# Patient Record
Sex: Female | Born: 1960
Health system: Southern US, Community
[De-identification: ages and names within clinical notes are randomized; demographics above are authoritative.]

## PROBLEM LIST (undated history)

## (undated) DIAGNOSIS — R0602 Shortness of breath: Secondary | ICD-10-CM

## (undated) DIAGNOSIS — R51 Headache: Secondary | ICD-10-CM

## (undated) DIAGNOSIS — R079 Chest pain, unspecified: Secondary | ICD-10-CM

## (undated) DIAGNOSIS — F329 Major depressive disorder, single episode, unspecified: Secondary | ICD-10-CM

## (undated) DIAGNOSIS — I509 Heart failure, unspecified: Secondary | ICD-10-CM

## (undated) DIAGNOSIS — M1990 Unspecified osteoarthritis, unspecified site: Secondary | ICD-10-CM

## (undated) DIAGNOSIS — F419 Anxiety disorder, unspecified: Secondary | ICD-10-CM

## (undated) DIAGNOSIS — E039 Hypothyroidism, unspecified: Secondary | ICD-10-CM

## (undated) DIAGNOSIS — F32A Depression, unspecified: Secondary | ICD-10-CM

## (undated) DIAGNOSIS — I10 Essential (primary) hypertension: Secondary | ICD-10-CM

## (undated) DIAGNOSIS — G709 Myoneural disorder, unspecified: Secondary | ICD-10-CM

## (undated) DIAGNOSIS — E119 Type 2 diabetes mellitus without complications: Secondary | ICD-10-CM

## (undated) HISTORY — PX: ABDOMINAL HYSTERECTOMY: SHX81

---

## 2001-09-18 ENCOUNTER — Ambulatory Visit (HOSPITAL_COMMUNITY): Admission: RE | Admit: 2001-09-18 | Discharge: 2001-09-18 | Payer: Self-pay | Admitting: Internal Medicine

## 2001-09-18 ENCOUNTER — Encounter: Payer: Self-pay | Admitting: Internal Medicine

## 2004-07-08 ENCOUNTER — Ambulatory Visit (HOSPITAL_COMMUNITY): Admission: RE | Admit: 2004-07-08 | Discharge: 2004-07-08 | Payer: Self-pay | Admitting: Family Medicine

## 2010-01-22 ENCOUNTER — Ambulatory Visit: Payer: Self-pay | Admitting: Cardiology

## 2010-05-02 ENCOUNTER — Emergency Department (HOSPITAL_COMMUNITY): Admission: EM | Admit: 2010-05-02 | Discharge: 2010-05-03 | Payer: Self-pay | Admitting: Emergency Medicine

## 2012-11-04 DIAGNOSIS — R079 Chest pain, unspecified: Secondary | ICD-10-CM

## 2012-11-04 HISTORY — DX: Chest pain, unspecified: R07.9

## 2012-11-18 DIAGNOSIS — R609 Edema, unspecified: Secondary | ICD-10-CM

## 2012-11-19 DIAGNOSIS — I509 Heart failure, unspecified: Secondary | ICD-10-CM

## 2012-11-19 DIAGNOSIS — I5021 Acute systolic (congestive) heart failure: Secondary | ICD-10-CM

## 2012-11-20 ENCOUNTER — Inpatient Hospital Stay (HOSPITAL_COMMUNITY)
Admission: RE | Admit: 2012-11-20 | Discharge: 2012-11-21 | DRG: 287 | Disposition: A | Discharge status: Home / Self Care | Payer: Medicare Other | Source: Ambulatory Visit | Attending: Cardiology | Admitting: Cardiology

## 2012-11-20 ENCOUNTER — Encounter (HOSPITAL_COMMUNITY)
Admission: RE | Disposition: A | Discharge status: Home / Self Care | Payer: Self-pay | Source: Ambulatory Visit | Attending: Cardiology

## 2012-11-20 ENCOUNTER — Other Ambulatory Visit: Payer: Self-pay | Admitting: Physician Assistant

## 2012-11-20 ENCOUNTER — Encounter (HOSPITAL_COMMUNITY): Payer: Self-pay | Admitting: General Practice

## 2012-11-20 ENCOUNTER — Inpatient Hospital Stay (HOSPITAL_COMMUNITY): Admit: 2012-11-20 | Payer: Self-pay | Source: Other Acute Inpatient Hospital | Admitting: Cardiology

## 2012-11-20 DIAGNOSIS — F339 Major depressive disorder, recurrent, unspecified: Secondary | ICD-10-CM

## 2012-11-20 DIAGNOSIS — I2510 Atherosclerotic heart disease of native coronary artery without angina pectoris: Secondary | ICD-10-CM | POA: Diagnosis present

## 2012-11-20 DIAGNOSIS — I509 Heart failure, unspecified: Secondary | ICD-10-CM

## 2012-11-20 DIAGNOSIS — F3289 Other specified depressive episodes: Secondary | ICD-10-CM | POA: Diagnosis present

## 2012-11-20 DIAGNOSIS — I428 Other cardiomyopathies: Secondary | ICD-10-CM | POA: Diagnosis present

## 2012-11-20 DIAGNOSIS — S0990XA Unspecified injury of head, initial encounter: Secondary | ICD-10-CM

## 2012-11-20 DIAGNOSIS — I10 Essential (primary) hypertension: Secondary | ICD-10-CM | POA: Diagnosis present

## 2012-11-20 DIAGNOSIS — Z23 Encounter for immunization: Secondary | ICD-10-CM

## 2012-11-20 DIAGNOSIS — E119 Type 2 diabetes mellitus without complications: Secondary | ICD-10-CM

## 2012-11-20 DIAGNOSIS — I2789 Other specified pulmonary heart diseases: Secondary | ICD-10-CM | POA: Diagnosis present

## 2012-11-20 DIAGNOSIS — E039 Hypothyroidism, unspecified: Secondary | ICD-10-CM | POA: Diagnosis present

## 2012-11-20 DIAGNOSIS — F329 Major depressive disorder, single episode, unspecified: Secondary | ICD-10-CM | POA: Diagnosis present

## 2012-11-20 DIAGNOSIS — I5021 Acute systolic (congestive) heart failure: Principal | ICD-10-CM

## 2012-11-20 DIAGNOSIS — I4891 Unspecified atrial fibrillation: Secondary | ICD-10-CM | POA: Diagnosis present

## 2012-11-20 HISTORY — DX: Chest pain, unspecified: R07.9

## 2012-11-20 HISTORY — DX: Essential (primary) hypertension: I10

## 2012-11-20 HISTORY — PX: LEFT AND RIGHT HEART CATHETERIZATION WITH CORONARY ANGIOGRAM: SHX5449

## 2012-11-20 HISTORY — DX: Depression, unspecified: F32.A

## 2012-11-20 HISTORY — DX: Headache: R51

## 2012-11-20 HISTORY — DX: Major depressive disorder, single episode, unspecified: F32.9

## 2012-11-20 HISTORY — DX: Type 2 diabetes mellitus without complications: E11.9

## 2012-11-20 HISTORY — DX: Hypothyroidism, unspecified: E03.9

## 2012-11-20 HISTORY — DX: Myoneural disorder, unspecified: G70.9

## 2012-11-20 HISTORY — DX: Unspecified osteoarthritis, unspecified site: M19.90

## 2012-11-20 HISTORY — DX: Heart failure, unspecified: I50.9

## 2012-11-20 HISTORY — DX: Anxiety disorder, unspecified: F41.9

## 2012-11-20 HISTORY — DX: Shortness of breath: R06.02

## 2012-11-20 LAB — CBC
MCH: 26.5 pg (ref 26.0–34.0)
MCHC: 33.7 g/dL (ref 30.0–36.0)
Platelets: 402 10*3/uL — ABNORMAL HIGH (ref 150–400)
RDW: 13.9 % (ref 11.5–15.5)

## 2012-11-20 LAB — POCT I-STAT 3, VENOUS BLOOD GAS (G3P V)
Bicarbonate: 25.8 mEq/L — ABNORMAL HIGH (ref 20.0–24.0)
O2 Saturation: 59 %
TCO2: 27 mmol/L (ref 0–100)
pCO2, Ven: 45.5 mmHg (ref 45.0–50.0)
pO2, Ven: 32 mmHg (ref 30.0–45.0)

## 2012-11-20 LAB — GLUCOSE, CAPILLARY
Glucose-Capillary: 202 mg/dL — ABNORMAL HIGH (ref 70–99)
Glucose-Capillary: 220 mg/dL — ABNORMAL HIGH (ref 70–99)

## 2012-11-20 LAB — POCT I-STAT 3, ART BLOOD GAS (G3+)
Acid-Base Excess: 1 mmol/L (ref 0.0–2.0)
Bicarbonate: 25.2 mEq/L — ABNORMAL HIGH (ref 20.0–24.0)
O2 Saturation: 93 %
pO2, Arterial: 67 mmHg — ABNORMAL LOW (ref 80.0–100.0)

## 2012-11-20 LAB — CREATININE, SERUM: Creatinine, Ser: 0.5 mg/dL (ref 0.50–1.10)

## 2012-11-20 SURGERY — LEFT AND RIGHT HEART CATHETERIZATION WITH CORONARY ANGIOGRAM
Anesthesia: LOCAL

## 2012-11-20 MED ORDER — FUROSEMIDE 10 MG/ML IJ SOLN
40.0000 mg | Freq: Two times a day (BID) | INTRAMUSCULAR | Status: DC
  Administered 2012-11-21: 40 mg via INTRAVENOUS
  Filled 2012-11-20 (×2): qty 4

## 2012-11-20 MED ORDER — FUROSEMIDE 40 MG PO TABS
40.0000 mg | ORAL_TABLET | Freq: Every day | ORAL | Status: DC
  Administered 2012-11-20: 40 mg via ORAL
  Filled 2012-11-20 (×2): qty 1

## 2012-11-20 MED ORDER — LEVOTHYROXINE SODIUM 75 MCG PO TABS
75.0000 ug | ORAL_TABLET | Freq: Every day | ORAL | Status: DC
  Administered 2012-11-21: 75 ug via ORAL
  Filled 2012-11-20 (×2): qty 1

## 2012-11-20 MED ORDER — METOPROLOL TARTRATE 1 MG/ML IV SOLN
INTRAVENOUS | Status: AC
  Filled 2012-11-20: qty 10

## 2012-11-20 MED ORDER — INFLUENZA VIRUS VACC SPLIT PF IM SUSP
0.5000 mL | INTRAMUSCULAR | Status: DC
  Filled 2012-11-20: qty 0.5

## 2012-11-20 MED ORDER — DULOXETINE HCL 20 MG PO CPEP
20.0000 mg | ORAL_CAPSULE | Freq: Every day | ORAL | Status: DC
  Administered 2012-11-20 – 2012-11-21 (×2): 20 mg via ORAL
  Filled 2012-11-20 (×3): qty 1

## 2012-11-20 MED ORDER — LIDOCAINE HCL (PF) 1 % IJ SOLN
INTRAMUSCULAR | Status: AC
  Filled 2012-11-20: qty 30

## 2012-11-20 MED ORDER — NITROGLYCERIN 0.2 MG/ML ON CALL CATH LAB
INTRAVENOUS | Status: AC
  Filled 2012-11-20: qty 1

## 2012-11-20 MED ORDER — ASPIRIN 81 MG PO CHEW
324.0000 mg | CHEWABLE_TABLET | ORAL | Status: DC
  Administered 2012-11-20: 324 mg via ORAL

## 2012-11-20 MED ORDER — ASPIRIN 81 MG PO CHEW: 324.0000 mg | CHEWABLE_TABLET | Freq: Once | ORAL | Status: DC

## 2012-11-20 MED ORDER — ASPIRIN 81 MG PO CHEW
81.0000 mg | CHEWABLE_TABLET | Freq: Every day | ORAL | Status: DC
  Administered 2012-11-21: 81 mg via ORAL
  Filled 2012-11-20: qty 1

## 2012-11-20 MED ORDER — SODIUM CHLORIDE 0.9 % IV SOLN: INTRAVENOUS | Status: AC

## 2012-11-20 MED ORDER — FENTANYL CITRATE 0.05 MG/ML IJ SOLN
INTRAMUSCULAR | Status: AC
  Filled 2012-11-20: qty 2

## 2012-11-20 MED ORDER — ACETAMINOPHEN 325 MG PO TABS
650.0000 mg | ORAL_TABLET | ORAL | Status: DC | PRN
  Administered 2012-11-20 – 2012-11-21 (×3): 650 mg via ORAL
  Filled 2012-11-20 (×3): qty 2

## 2012-11-20 MED ORDER — HEPARIN (PORCINE) IN NACL 2-0.9 UNIT/ML-% IJ SOLN
INTRAMUSCULAR | Status: AC
  Filled 2012-11-20: qty 1000

## 2012-11-20 MED ORDER — CITALOPRAM HYDROBROMIDE 20 MG PO TABS
20.0000 mg | ORAL_TABLET | Freq: Every day | ORAL | Status: DC
  Administered 2012-11-20 – 2012-11-21 (×2): 20 mg via ORAL
  Filled 2012-11-20 (×2): qty 1

## 2012-11-20 MED ORDER — CARVEDILOL 12.5 MG PO TABS
12.5000 mg | ORAL_TABLET | Freq: Two times a day (BID) | ORAL | Status: DC
  Administered 2012-11-20 – 2012-11-21 (×3): 12.5 mg via ORAL
  Filled 2012-11-20 (×5): qty 1

## 2012-11-20 MED ORDER — MIDAZOLAM HCL 2 MG/2ML IJ SOLN
INTRAMUSCULAR | Status: AC
  Filled 2012-11-20: qty 2

## 2012-11-20 MED ORDER — LISINOPRIL 40 MG PO TABS
40.0000 mg | ORAL_TABLET | Freq: Every day | ORAL | Status: DC
  Administered 2012-11-21: 40 mg via ORAL
  Filled 2012-11-20: qty 1

## 2012-11-20 MED ORDER — INSULIN ASPART 100 UNIT/ML ~~LOC~~ SOLN
0.0000 [IU] | Freq: Three times a day (TID) | SUBCUTANEOUS | Status: DC
  Administered 2012-11-20 (×2): 2 [IU] via SUBCUTANEOUS
  Administered 2012-11-21: 5 [IU] via SUBCUTANEOUS
  Administered 2012-11-21: 2 [IU] via SUBCUTANEOUS

## 2012-11-20 MED ORDER — HEPARIN SODIUM (PORCINE) 5000 UNIT/ML IJ SOLN
5000.0000 [IU] | Freq: Three times a day (TID) | INTRAMUSCULAR | Status: DC
  Administered 2012-11-20 – 2012-11-21 (×2): 5000 [IU] via SUBCUTANEOUS
  Filled 2012-11-20 (×6): qty 1

## 2012-11-20 MED ORDER — CITALOPRAM HYDROBROMIDE 10 MG/5ML PO SOLN: 20.0000 mg | Freq: Every day | ORAL | Status: DC

## 2012-11-20 MED ORDER — ASPIRIN 81 MG PO CHEW
CHEWABLE_TABLET | ORAL | Status: AC
  Filled 2012-11-20: qty 4

## 2012-11-20 MED ORDER — ONDANSETRON HCL 4 MG/2ML IJ SOLN: 4.0000 mg | Freq: Four times a day (QID) | INTRAMUSCULAR | Status: DC | PRN

## 2012-11-20 NOTE — H&P (Signed)
  Patient transferred from [facility] in [place]. Complete records sent from the outside hospital and will be scanned into [facility]. 51 yo WF with history of DM, HTN, and closed head injury presents with new onset of CHF. Echo demonstrates EF 20-25%. For right and left heart cath today.  Peter Swaziland MD, [facility]

## 2012-11-20 NOTE — Interval H&P Note (Signed)
History and Physical Interval Note:  11/20/2012 8:35 AM  Ashley Watts  has presented today for surgery, with the diagnosis of cad  The various methods of treatment have been discussed with the patient and family. After consideration of risks, benefits and other options for treatment, the patient has consented to  Procedure(s) (LRB) with comments: LEFT AND RIGHT HEART CATHETERIZATION WITH CORONARY ANGIOGRAM (N/A) as a surgical intervention .  The patient's history has been reviewed, patient examined, no change in status, stable for surgery.  I have reviewed the patient's chart and labs.  Questions were answered to the patient's satisfaction.     Theron Arista [facility] 11/20/2012 8:35 AM

## 2012-11-20 NOTE — CV Procedure (Signed)
   Cardiac Catheterization Procedure Note  Name: Ashley Watts MRN: [id number] DOB: [birth year]  Procedure: Right Heart Cath, Left Heart Cath, Selective Coronary Angiography, LV angiography  Indication: 51 year old white female with history of diabetes and hypertension who presents with new onset of congestive heart failure. Ejection fraction by echocardiogram was 20-25%.   Procedural Details: The right groin was prepped, draped, and anesthetized with 1% lidocaine. Using the modified Seldinger technique a 5 French sheath was placed in the right femoral artery and a 7 French sheath was placed in the right femoral vein. A Swan-Ganz catheter was used for the right heart catheterization. Standard protocol was followed for recording of right heart pressures and sampling of oxygen saturations. Fick cardiac output was calculated. Standard Judkins catheters were used for selective coronary angiography and left ventriculography. There were no immediate procedural complications. The patient was transferred to the post catheterization recovery area for further monitoring.  Procedural Findings: Hemodynamics RA 7/5 with a mean of 4 mmHg RV 49/8 mmHg PA 54/30 with a mean of 40 mmHg PCWP 22/19 with a mean of 15 mmHg LV 169/20 mmHg AO 173/107 with a mean of 139 mmHg  There is no significant aortic or mitral valve gradient.  Oxygen saturations: PA 59% AO 93%  Cardiac Output (Fick) 4.74 L per minute  Cardiac Index (Fick) 2.42 L per minute per meter square   Coronary angiography: Coronary dominance: right  Left mainstem: Normal.  Left anterior descending (LAD): Normal.  Left circumflex (LCx): Normal.  Right coronary artery (RCA): Normal.  Left ventriculography: Left ventricular size is mildly increased. There is severe global hypokinesis with ejection fraction of 20-25%. There is minimal mitral insufficiency.  Final Conclusions:   1. Normal coronary anatomy. 2. Severe left ventricular  dysfunction. 3. Mild to moderate pulmonary hypertension.  Recommendations: We'll optimize her medical therapy for treatment of her congestive heart failure. We'll ask heart failure team to evaluate.   Theron Arista [facility] 11/20/2012, 9:13 AM

## 2012-11-21 DIAGNOSIS — I5021 Acute systolic (congestive) heart failure: Principal | ICD-10-CM

## 2012-11-21 DIAGNOSIS — E119 Type 2 diabetes mellitus without complications: Secondary | ICD-10-CM

## 2012-11-21 DIAGNOSIS — F339 Major depressive disorder, recurrent, unspecified: Secondary | ICD-10-CM

## 2012-11-21 DIAGNOSIS — F329 Major depressive disorder, single episode, unspecified: Secondary | ICD-10-CM

## 2012-11-21 DIAGNOSIS — S0990XA Unspecified injury of head, initial encounter: Secondary | ICD-10-CM

## 2012-11-21 LAB — BASIC METABOLIC PANEL
BUN: 16 mg/dL (ref 6–23)
Chloride: 96 mEq/L (ref 96–112)
Creatinine, Ser: 0.51 mg/dL (ref 0.50–1.10)
GFR calc Af Amer: >90 mL/min (ref >90)
GFR calc non Af Amer: >90 mL/min (ref >90)

## 2012-11-21 LAB — CBC
HCT: 38.1 % (ref 36.0–46.0)
MCH: 26.2 pg (ref 26.0–34.0)
MCHC: 33.6 g/dL (ref 30.0–36.0)
RDW: 13.5 % (ref 11.5–15.5)

## 2012-11-21 LAB — GLUCOSE, CAPILLARY

## 2012-11-21 MED ORDER — CARVEDILOL 3.125 MG PO TABS: 6.2500 mg | ORAL_TABLET | Freq: Two times a day (BID) | ORAL | Status: DC

## 2012-11-21 MED ORDER — LISINOPRIL 40 MG PO TABS: 40.0000 mg | ORAL_TABLET | Freq: Every day | ORAL | Status: DC

## 2012-11-21 MED ORDER — LIVING WELL WITH DIABETES BOOK
Freq: Once | Status: AC
  Administered 2012-11-21: 16:00:00
  Filled 2012-11-21: qty 1

## 2012-11-21 MED ORDER — FUROSEMIDE 40 MG PO TABS: 40.0000 mg | ORAL_TABLET | Freq: Every day | ORAL | Status: DC

## 2012-11-21 MED ORDER — DULOXETINE HCL 60 MG PO CPEP
60.0000 mg | ORAL_CAPSULE | Freq: Two times a day (BID) | ORAL | Status: DC
  Filled 2012-11-21: qty 1

## 2012-11-21 MED ORDER — POTASSIUM CHLORIDE ER 10 MEQ PO TBCR: 20.0000 meq | EXTENDED_RELEASE_TABLET | Freq: Every day | ORAL | Status: DC

## 2012-11-21 NOTE — Plan of Care (Signed)
Problem: Food- and Nutrition-Related Knowledge Deficit (NB-1.1) Goal: Nutrition education Formal process to instruct or train a patient/client in a skill or to impart knowledge to help patients/clients voluntarily manage or modify food choices and eating behavior to maintain or improve health.  Outcome: Completed/Met Date Met:  11/21/12  RD consulted for nutrition education regarding new onset CHF.  RD provided "Heart Failure Nutrition Therapy" handout from the Academy of Nutrition and Dietetics. Provided examples on ways to decrease sodium intake in diet. Discouraged intake of processed foods and use of salt shaker. Encouraged more cooking at home vs eating out.  Expect fair compliance.  Body mass index is 33.54 kg/(m^2). Pt meets criteria for Obesity Class I based on current BMI.  No further nutrition interventions warranted at this time.  Patient discharging today.   Kirkland Hun, RD, LDN Pager #: [phone] After-Hours Pager #: [phone]

## 2012-11-21 NOTE — Progress Notes (Signed)
CARDIAC REHAB PHASE I   PRE:  Rate/Rhythm: 75 SR    BP: sitting 128/80    SaO2: 96 RA  MODE:  Ambulation: 500 ft   POST:  Rate/Rhythm: 93 SR    BP: sitting 102/59     SaO2: 96 RA  Pt c/o H/A on arrival, trying to sleep. She sts this is normal for her. Steady walking, sts SOB is better than on admit. Sts she feels weak in general. Very flat affect presumably due to depression and head injury. BP lower after walk. To recliner. Long discussion with pt re:HF, daily wts, diet, ex. Gave HF booklet and RN to set up CHF video. Pt voices understanding. Will need reiteration. Needs HHRN. Sts she has scales. [id number]  Ashley Watts CES, ACSM

## 2012-11-21 NOTE — Care Management Note (Signed)
    Page 1 of 2   11/21/2012     3:55:19 PM   CARE MANAGEMENT NOTE 11/21/2012  Patient:  Ashley Watts, Ashley Watts   Account Number:  [id number]  Date Initiated:  11/21/2012  Documentation initiated by:  Donn Pierini  Subjective/Objective Assessment:   Pt admitted from [facility]- new CHF     Action/Plan:   PTA pt lived at home   Anticipated DC Date:  11/21/2012   Anticipated DC Plan:  HOME W HOME HEALTH SERVICES      DC Planning Services  CM consult      [facility] Choice  HOME HEALTH   Choice offered to / List presented to:  C-1 Patient        HH arranged  HH-10 DISEASE MANAGEMENT  HH-1 RN      [facility] agency  [facility]   Status of service:  Completed, signed off Medicare Important Message given?   (If response is "NO", the following Medicare IM given date fields will be blank) Date Medicare IM given:   Date Additional Medicare IM given:    Discharge Disposition:  HOME W HOME HEALTH SERVICES  Per UR Regulation:  Reviewed for med. necessity/level of care/duration of stay  If discussed at Long Length of Stay Meetings, dates discussed:    Comments:  11/21/12- 1530- Donn Pierini RN, BSN [phone] Pt for d/c today, order for HH-RN for CHF management- spoke with pt at bedside- list of [facility] agencies given to pt for [facility]- pt would like to try [place] for [facility] services- spoke with Corrie Dandy from [place] and at this time Genevieve Norlander is not servicing [facility] and is unable to accept referral. Informed pt of this and second choice for [facility] per pt is [facility]- referral for HH-RN called to Lupita Leash with [facility]- services to begin within 24-48 hr. post discharge- pt states she may stay with her sister at discharge- is undecided at this time.

## 2012-11-21 NOTE — Discharge Summary (Signed)
Advanced Heart Failure Team  Discharge Summary   Patient ID: Ashley Watts MRN: [id number], DOB/AGE: [birth year] 51 y.o. Admit date: 11/20/2012 D/C date:     11/21/2012   Primary Discharge Diagnoses:  1. Acute systolic heart failure 2. Non-ischemic cardiomyopathy EF 20%  Secondary Discharge Diagnoses:  1. Diabetes 2. H/o closed head injury 3. HTN 4. Depression 5. Hypothyroidism  Hospital Course: [name] is a 51 y/o woman with the above medical problems.  She was admitted to [facility] with a several week h/o progressive dyspnea. BNP was 850. A 2-d echo showed an EF 20%. She was transferred to [facility] for further evaluation and management.   On 12/17, she underwent R & L heart catheterization by Dr. Swaziland. This showed an EF 20% with normal coronary arteries. RHC showed relatively well compensated hemodynamics.  RA 7/5 with a mean of 4 mmHg  RV 49/8 mmHg  PA 54/30 with a mean of 40 mmHg  PCWP 22/19 with a mean of 15 mmHg  LV 169/20 mmHg  AO 173/107 with a mean of 139 mmHg  There is no significant aortic or mitral valve gradient.  Oxygen saturations:  PA 59%  AO 93%  Cardiac Output (Fick) 4.74 L per minute  Cardiac Index (Fick) 2.42 L per minute per meter square  Her lasix was transitioned to po and her other HF meds were titrated. On the day of discharge she was asymptomatic and able to ambulate 500 feet with Cardiac Rehab. Extensive HF teaching was performed with the HF team including instructions about daily weight monitoring. Home Health follow-up was arranged. Discharge weight was 195 pounds.(admit 201 pounds)   Discharge Vitals: Blood pressure 128/80, pulse 79, temperature 98.6 F (37 C), temperature source Oral, resp. rate 16, height 5\' 4"  (1.626 m), weight 88.633 kg (195 lb 6.4 oz), SpO2 96.00%.  General:  Well appearing. No resp difficulty HEENT: normal Neck: supple. no JVD. Carotids 2+ bilat; no bruits. No lymphadenopathy or thryomegaly  appreciated. Cor: PMI nondisplaced. Regular rate & rhythm. No rubs, gallops or murmurs. Lungs: clear Abdomen: soft, nontender, nondistended. No hepatosplenomegaly. No bruits or masses. Good bowel sounds. Extremities: no cyanosis, clubbing, rash, edema Neuro: alert & orientedx3, cranial nerves grossly intact. moves all 4 extremities w/o difficulty. Affect flat  Labs: Lab Results  Component Value Date   WBC 9.2 11/21/2012   HGB 12.8 11/21/2012   HCT 38.1 11/21/2012   MCV 77.9* 11/21/2012   PLT 358 11/21/2012     Lab 11/21/12 0535  NA 135  K 3.8  CL 96  CO2 25  BUN 16  CREATININE 0.51  CALCIUM 9.3  PROT --  BILITOT --  ALKPHOS --  ALT --  AST --  GLUCOSE 214*     Diagnostic Studies/Procedures   Cardiac cath - see above  Discharge Medications     Medication List     As of 11/21/2012  2:40 PM    STOP taking these medications         quinapril 20 MG tablet   Commonly known as: ACCUPRIL      TAKE these medications         aspirin EC 81 MG tablet   Take 81 mg by mouth daily.      carvedilol 3.125 MG tablet   Commonly known as: COREG   Take 2 tablets (6.25 mg total) by mouth 2 (two) times daily with a meal.      citalopram 20 MG tablet   Commonly  known as: CELEXA   Take 20 mg by mouth daily.      DULoxetine 60 MG capsule   Commonly known as: CYMBALTA   Take 60 mg by mouth 2 (two) times daily.      furosemide 40 MG tablet   Commonly known as: LASIX   Take 1 tablet (40 mg total) by mouth daily.      levothyroxine 75 MCG tablet   Commonly known as: SYNTHROID, LEVOTHROID   Take 75 mcg by mouth daily.      lisinopril 40 MG tablet   Commonly known as: PRINIVIL,ZESTRIL   Take 1 tablet (40 mg total) by mouth daily.      meloxicam 15 MG tablet   Commonly known as: MOBIC   Take 15 mg by mouth daily.      metFORMIN 1000 MG tablet   Commonly known as: GLUCOPHAGE   Take 1,000 mg by mouth 2 (two) times daily with a meal.      potassium chloride 10 MEQ  tablet   Commonly known as: K-DUR   Take 2 tablets (20 mEq total) by mouth daily.         Disposition   The patient will be discharged in stable condition to home. Discharge Orders    Future Appointments: Provider: Department: Dept Phone: Center:   11/29/2012 2:30 PM [facility] [phone] None     Future Orders Please Complete By Expires   Diet - low sodium heart healthy      Increase activity slowly      (HEART FAILURE PATIENTS) Call MD:  Anytime you have any of the following symptoms: 1) 3 pound weight gain in 24 hours or 5 pounds in 1 week 2) shortness of breath, with or without a dry hacking cough 3) swelling in the hands, feet or stomach 4) if you have to sleep on extra pillows at night in order to breathe.      Heart Failure patients record your daily weight using the same scale at the same time of day      ACE Inhibitor / ARB already ordered        Follow-up Information    Follow up with Arvilla Meres, MD. On 11/29/2012. (at 2:00 Garage Code 0009 )    Contact information:   [address] [place] [address] [phone]            Duration of Discharge Encounter: Greater than 35 minutes   Migdalia Dk  11/21/2012, 2:40 PM

## 2012-11-21 NOTE — Progress Notes (Signed)
Inpatient Diabetes Program Recommendations  AACE/ADA: New Consensus Statement on Inpatient Glycemic Control (2013)  Target Ranges:  Prepandial:   less than 140 mg/dL      Peak postprandial:   less than 180 mg/dL (1-2 hours)      Critically ill patients:  140 - 180 mg/dL   Reason for Visit: Spoke briefly with patient regarding diabetes management.  She states her glucose meter is not working.  She thinks that her last A1C was 7 or 9.  States she learns best by reading and is interested in further information about diabetes.  Ordered "Living Well with Diabetes" booklet for patients.  Also patient is interested in obtaining new PCP here in [place].  Gave her the number for the physician referral line.  Also instructed her to follow-up with PCP regarding obtaining Rx. For new glucose meter.

## 2012-11-21 NOTE — Progress Notes (Signed)
MEDICARE-CERTIFIED HOME HEALTH AGENCIES [place]   Agencies that are Medicare-Certified and affiliated with [facility]  Home Health Agency  Telephone Number Address  [facility]  The [facility] has ownership interest in this company; however, you are under no obligation to use this agency. [phone]  [address] [place], [place] [address]    Agencies that are Medicare-Certified and are not affiliated with [facility]   Home Health Agency Telephone Number Address  [facility] [phone] Fax [phone] [address] [place], [place]  [address]  [facility] [phone] [address] [place], [place] [address]  [facility]  [phone] Fax [phone] [address]  [place], [place]  [address]  [facility] [phone] or [phone] [address] [place], [place] [address]  [facility] [phone] or [phone] [address] [place], [place]  [address]      Agencies that are not Medicare-Certified and are not affiliated with [facility]    Home Health Agency Telephone Number Address  [facility] [phone] Fax [phone] [address] [place], [place]  [address]  [facility] [phone] or [phone] Fax [phone] [address] [place], [place]  [address]  [facility]  [phone] [address] [place], [place]  [facility] [phone] Fax [phone] [address] [place], [place]  [address]  [facility] [phone] Fax [phone] [address] [place], [place]  [address]  [facility] [phone] [address] [place], [place]  [address]  [facility] [facility] [phone] Fax [phone] [address] [place], [place] [address]  [facility] [phone] [address] [place], [place]  [address]  [facility] [phone] Fax [phone] [address] , [place]  [address]    

## 2012-11-21 NOTE — Progress Notes (Signed)
Utilization review completed.  

## 2012-11-29 ENCOUNTER — Ambulatory Visit (HOSPITAL_COMMUNITY): Payer: Medicare Other

## 2012-12-04 ENCOUNTER — Encounter (HOSPITAL_COMMUNITY): Payer: Self-pay

## 2012-12-04 ENCOUNTER — Ambulatory Visit (HOSPITAL_COMMUNITY)
Admission: RE | Admit: 2012-12-04 | Discharge: 2012-12-04 | Disposition: A | Discharge status: Home / Self Care | Payer: Medicare Other | Source: Ambulatory Visit | Attending: Internal Medicine | Admitting: Internal Medicine

## 2012-12-04 VITALS — BP 134/88 | HR 91 | Wt 202.8 lb

## 2012-12-04 DIAGNOSIS — I5022 Chronic systolic (congestive) heart failure: Secondary | ICD-10-CM | POA: Insufficient documentation

## 2012-12-04 LAB — BASIC METABOLIC PANEL
CO2: 24 mEq/L (ref 19–32)
Chloride: 96 mEq/L (ref 96–112)
Creatinine, Ser: 0.49 mg/dL — ABNORMAL LOW (ref 0.50–1.10)
GFR calc Af Amer: >90 mL/min (ref >90)
Potassium: 4.2 mEq/L (ref 3.5–5.1)
Sodium: 136 mEq/L (ref 135–145)

## 2012-12-04 MED ORDER — CARVEDILOL 6.25 MG PO TABS: 9.3750 mg | ORAL_TABLET | Freq: Two times a day (BID) | ORAL | Status: DC

## 2012-12-04 NOTE — Patient Instructions (Addendum)
Take Carvedilol 9.375 mg twice a day  Do the following things EVERYDAY: 1) Weigh yourself in the morning before breakfast. Write it down and keep it in a log. 2) Take your medicines as prescribed 3) Eat low salt foods-Limit salt (sodium) to 2000 mg per day.  4) Stay as active as you can everyday 5) Limit all fluids for the day to less than 2 liters

## 2012-12-04 NOTE — Progress Notes (Signed)
Patient ID: Ashley Watts, female   DOB: 06/18/1961, 51 y.o.   MRN: [id number]  Weight Range  195   Baseline proBNP     HPI: Ashley Watts is a 51 y/o woman chronic  systolic heart failure, NICM, Diabetes,  H/O closed head injury,  HTN,  Depression, and Hypothyroidism.   Discharged from [facility] 11/21/12 after being treated for acute systolic heart failure. HF medications initiated. D/C weight 195 pounds.   RHC/LHC 11/20/12  normal coronary arteries RA 7/5 with a mean of 4 mmHg  RV 49/8 mmHg  PA 54/30 with a mean of 40 mmHg  PCWP 22/19 with a mean of 15 mmHg  LV 169/20 mmHg  AO 173/107 with a mean of 139 mmHg  There is no significant aortic or mitral valve gradient.  Oxygen saturations:  PA 59%  AO 93%  Cardiac Output (Fick) 4.74 L per minute  Cardiac Index (Fick) 2.42 L per minute per meter square   She returns for post hospital follow up. Denies SOB/PND/Orthopnea. She does not weight daily. HH not following because she went home with her sister. Compliant with medicaitons. Son prepares pill box.     ROS: All systems negative except as listed in HPI, PMH and Problem List.  Past Medical History  Diagnosis Date  . CHF (congestive heart failure)   . Chest pain 11/2012  . Hypertension   . Hypothyroidism   . Depression   . Anxiety   . Shortness of breath   . Diabetes mellitus without complication   . Headache   . Neuromuscular disorder     DIABETIC NEUROPATHY  . Arthritis     Current Outpatient Prescriptions  Medication Sig Dispense Refill  . aspirin EC 81 MG tablet Take 81 mg by mouth daily.      . carvedilol (COREG) 3.125 MG tablet Take 2 tablets (6.25 mg total) by mouth 2 (two) times daily with a meal.  60 tablet  6  . citalopram (CELEXA) 20 MG tablet Take 20 mg by mouth daily.      . DULoxetine (CYMBALTA) 60 MG capsule Take 60 mg by mouth 2 (two) times daily.      . furosemide (LASIX) 40 MG tablet Take 1 tablet (40 mg total) by mouth daily.  30 tablet  3  .  levothyroxine (SYNTHROID, LEVOTHROID) 75 MCG tablet Take 75 mcg by mouth daily.      [name] lisinopril (PRINIVIL,ZESTRIL) 40 MG tablet Take 1 tablet (40 mg total) by mouth daily.  30 tablet  3  . meloxicam (MOBIC) 15 MG tablet Take 15 mg by mouth daily.      . metFORMIN (GLUCOPHAGE) 1000 MG tablet Take 1,000 mg by mouth 2 (two) times daily with a meal.      . potassium chloride (K-DUR) 10 MEQ tablet Take 2 tablets (20 mEq total) by mouth daily.  30 tablet  3     PHYSICAL EXAM: Filed Vitals:   12/04/12 1100  BP: 134/88  Pulse: 91  Weight: 202 lb 12.8 oz (91.989 kg)  SpO2: 96%    General:  Chronically ill appearing. No resp difficulty HEENT: normal Neck: supple. JVP flat. Carotids 2+ bilaterally; no bruits. No lymphadenopathy or thryomegaly appreciated. Cor: PMI normal. Regular rate & rhythm. No rubs, gallops or murmurs. Lungs: clear Abdomen: soft, nontender, nondistended. No hepatosplenomegaly. No bruits or masses. Good bowel sounds. Extremities: no cyanosis, clubbing, rash, edema Neuro: alert & orientedx3, cranial nerves grossly intact. Moves all 4 extremities w/o difficulty. Flat  Affect pleasant.      ASSESSMENT & PLAN:

## 2012-12-04 NOTE — Assessment & Plan Note (Signed)
NYHA II. Volume status stable. Continue current diuretic regimen. Increase carvedilol to 9.375 mg twice a day. Reinforced daily weights, low salt food choices, and medication compliance. Increase carvedilol to 9.375 mg bid. Check BMET today. Will need repeat ECHO once medication optimized for 3 months. She has requested referral to [facility] in [place] for ongoing HF management. She has difficulty with transportation. Will refer to Joni Reining NP for follow up in 3 weeks.

## 2012-12-07 ENCOUNTER — Telehealth (HOSPITAL_COMMUNITY): Payer: Self-pay | Admitting: Cardiology

## 2012-12-07 NOTE — Telephone Encounter (Signed)
Attempting to contact pt regarding lab results # d/c

## 2012-12-07 NOTE — Telephone Encounter (Signed)
Message copied by JEFFRIES, Milagros Reap on Fri Dec 07, 2012  3:50 PM ------      Message from: [place], [place] D      Created: Tue Dec 04, 2012  1:44 PM       Stable. Follow up with PCP regarding elevated glucose.

## 2012-12-13 ENCOUNTER — Telehealth (HOSPITAL_COMMUNITY): Payer: Self-pay | Admitting: Cardiology

## 2012-12-13 NOTE — Telephone Encounter (Signed)
Message copied by JEFFRIES, Milagros Reap on Thu Dec 13, 2012  9:22 AM ------      Message from: [place], [place] D      Created: Tue Dec 04, 2012  1:44 PM       Stable. Follow up with PCP regarding elevated glucose.

## 2013-03-01 ENCOUNTER — Ambulatory Visit (INDEPENDENT_AMBULATORY_CARE_PROVIDER_SITE_OTHER): Payer: Medicare Other | Admitting: Internal Medicine

## 2013-03-01 ENCOUNTER — Encounter: Payer: Self-pay | Admitting: *Deleted

## 2013-03-01 ENCOUNTER — Encounter: Payer: Self-pay | Admitting: Internal Medicine

## 2013-03-01 ENCOUNTER — Ambulatory Visit (HOSPITAL_COMMUNITY)
Admission: RE | Admit: 2013-03-01 | Discharge: 2013-03-01 | Disposition: A | Discharge status: Home / Self Care | Payer: Medicare Other | Source: Ambulatory Visit | Attending: Internal Medicine | Admitting: Internal Medicine

## 2013-03-01 VITALS — BP 105/70 | HR 80 | Ht 64.0 in | Wt 205.8 lb

## 2013-03-01 DIAGNOSIS — R0602 Shortness of breath: Secondary | ICD-10-CM

## 2013-03-01 DIAGNOSIS — I369 Nonrheumatic tricuspid valve disorder, unspecified: Secondary | ICD-10-CM

## 2013-03-01 DIAGNOSIS — I5021 Acute systolic (congestive) heart failure: Secondary | ICD-10-CM

## 2013-03-01 DIAGNOSIS — I5022 Chronic systolic (congestive) heart failure: Secondary | ICD-10-CM

## 2013-03-01 DIAGNOSIS — R079 Chest pain, unspecified: Secondary | ICD-10-CM | POA: Insufficient documentation

## 2013-03-01 DIAGNOSIS — E119 Type 2 diabetes mellitus without complications: Secondary | ICD-10-CM | POA: Insufficient documentation

## 2013-03-01 LAB — CBC
HCT: 34.3 % — ABNORMAL LOW (ref 36.0–46.0)
MCV: 79.4 fL (ref 78.0–100.0)
RBC: 4.32 MIL/uL (ref 3.87–5.11)
RDW: 15.1 % (ref 11.5–15.5)
WBC: 9.3 10*3/uL (ref 4.0–10.5)

## 2013-03-01 NOTE — Progress Notes (Signed)
HPI Patinet is a 52 yo with a NICM  She went initially to [facility] hospital  Transferred to [facility].  There she underwent R and L heart cath. This showed:  RA 7/5 with a mean of 4 mmHg  RV 49/8 mmHg  PA 54/30 with a mean of 40 mmHg  PCWP 22/19 with a mean of 15 mmHg  LV 169/20 mmHg  AO 173/107 with a mean of 139 mmHg  There is no significant aortic or mitral valve gradient.  Oxygen saturations:  PA 59%  AO 93%  Cardiac Output (Fick) 4.74 L per minute  Cardiac Index (Fick) 2.42 L per minute per meter square     Last seen in December 2013  Since then still says she feels weak.  Rare swelling in feet.  Not weighing daily Still with uncomfortable sensation in chest Pain/pressure  Every other day  WIth and without acitvity At night sleeping better.  1 pillow Occasional presyncope.  Occurs every other day.  While moving around  Has not passed out. Not at any particular time of day. Breathing about 30% better than when admitted to [facility]. Allergies  Allergen Reactions  . Codeine Itching  . Morphine And Related Itching  . Hydrocodone Itching and Rash    Current Outpatient Prescriptions  Medication Sig Dispense Refill  . aspirin EC 81 MG tablet Take 81 mg by mouth daily.      . carvedilol (COREG) 6.25 MG tablet Take 1.5 tablets (9.375 mg total) by mouth 2 (two) times daily with a meal.  90 tablet  3  . citalopram (CELEXA) 20 MG tablet Take 20 mg by mouth daily.      . DULoxetine (CYMBALTA) 60 MG capsule Take 60 mg by mouth 2 (two) times daily.      . furosemide (LASIX) 40 MG tablet Take 1 tablet (40 mg total) by mouth daily.  30 tablet  3  . levothyroxine (SYNTHROID, LEVOTHROID) 75 MCG tablet Take 75 mcg by mouth daily.      [name] lisinopril (PRINIVIL,ZESTRIL) 40 MG tablet Take 1 tablet (40 mg total) by mouth daily.  30 tablet  3  . meloxicam (MOBIC) 15 MG tablet Take 15 mg by mouth as needed.       . metFORMIN (GLUCOPHAGE) 1000 MG tablet Take 1,000 mg by mouth 2 (two) times daily  with a meal.      . potassium chloride (K-DUR) 10 MEQ tablet Take 2 tablets (20 mEq total) by mouth daily.  30 tablet  3   No current facility-administered medications for this visit.    Past Medical History  Diagnosis Date  . CHF (congestive heart failure)   . Chest pain 11/2012  . Hypertension   . Hypothyroidism   . Depression   . Anxiety   . Shortness of breath   . Diabetes mellitus without complication   . Headache   . Neuromuscular disorder     DIABETIC NEUROPATHY  . Arthritis     Past Surgical History  Procedure Laterality Date  . Abdominal hysterectomy      No family history on file.  History   Social History  . Marital Status: Divorced    Spouse Name: N/A    Number of Children: N/A  . Years of Education: N/A   Occupational History  . Not on file.   Social History Main Topics  . Smoking status: Never Smoker   . Smokeless tobacco: Never Used  . Alcohol Use: No  . Drug Use:  No  . Sexually Active: Not Currently   Other Topics Concern  . Not on file   Social History Narrative  . No narrative on file    Review of Systems:  All systems reviewed.  They are negative to the above problem except as previously stated.  Vital Signs: BP 105/70  Pulse 80  Ht 5\' 4"  (1.626 m)  Wt 205 lb 12 oz (93.328 kg)  BMI 35.3 kg/m2  SpO2 97%  Physical Exam Patient is in NAD HEENT:  Normocephalic, atraumatic. EOMI, PERRLA.  Neck: JVP is normal.  No bruits.  Lungs: clear to auscultation. No rales no wheezes.  Heart: Regular rate and rhythm. Normal S1, S2. No S3.   No significant murmurs. PMI not displaced.  Abdomen:  Supple, nontender. Normal bowel sounds. No masses. No hepatomegaly.  Extremities:   Good distal pulses throughout. No lower extremity edema.  Musculoskeletal :moving all extremities.  Neuro:   alert and oriented x3.  CN II-XII grossly intact.  EKG SR 77.   Assessment and Plan:  1.  NICM  Patient;s volume status looks good  I would check labs  I  would also recomm an echo to reevaluate LV function Continue current meds.   I would not push doses due to dizziness.   I will see if she can get into cardiac rehab.  WIll arrange f/u in 3 months. I

## 2013-03-01 NOTE — Patient Instructions (Addendum)
Your physician recommends that you schedule a follow-up appointment in:  Dr Tenny Craw will call you with results and determine follow up  Your physician recommends that you return for lab work in: Today  Your physician has requested that you have an echocardiogram. Echocardiography is a painless test that uses sound waves to create images of your heart. It provides your doctor with information about the size and shape of your heart and how well your heart's chambers and valves are working. This procedure takes approximately one hour. There are no restrictions for this procedure.  Cardiac Rehab

## 2013-03-01 NOTE — Progress Notes (Signed)
*  PRELIMINARY RESULTS* Echocardiogram 2D Echocardiogram has been performed.  Ashley Watts 03/01/2013, 11:54 AM

## 2013-03-02 LAB — BASIC METABOLIC PANEL
Chloride: 96 mEq/L (ref 96–112)
Glucose, Bld: 121 mg/dL — ABNORMAL HIGH (ref 70–99)
Potassium: 4.8 mEq/L (ref 3.5–5.3)
Sodium: 139 mEq/L (ref 135–145)

## 2013-03-04 ENCOUNTER — Other Ambulatory Visit: Payer: Self-pay | Admitting: *Deleted

## 2013-03-04 DIAGNOSIS — I5022 Chronic systolic (congestive) heart failure: Secondary | ICD-10-CM

## 2013-03-08 ENCOUNTER — Telehealth: Payer: Self-pay | Admitting: *Deleted

## 2013-03-08 ENCOUNTER — Other Ambulatory Visit: Payer: Self-pay | Admitting: *Deleted

## 2013-03-08 NOTE — Telephone Encounter (Signed)
Noted the following incoming message from MD Tenny Craw concerning pt via [employer]:  Arrange for f/u in 3 months.  Noted MD Tenny Craw does not have schedule out for 3 months, contact TJ and recall was placed in pt chart to have her call in to schedule apt with MD Tenny Craw once schedule is available

## 2013-04-20 ENCOUNTER — Other Ambulatory Visit (HOSPITAL_COMMUNITY): Payer: Self-pay | Admitting: Adult Health

## 2013-04-29 ENCOUNTER — Emergency Department (HOSPITAL_COMMUNITY): Payer: Medicare Other

## 2013-04-29 ENCOUNTER — Inpatient Hospital Stay (HOSPITAL_COMMUNITY)
Admission: EM | Admit: 2013-04-29 | Discharge: 2013-04-30 | DRG: 069 | Disposition: A | Discharge status: Home / Self Care | Payer: Medicare Other | Attending: Internal Medicine | Admitting: Internal Medicine

## 2013-04-29 ENCOUNTER — Encounter (HOSPITAL_COMMUNITY): Payer: Self-pay

## 2013-04-29 DIAGNOSIS — I5022 Chronic systolic (congestive) heart failure: Secondary | ICD-10-CM | POA: Diagnosis present

## 2013-04-29 DIAGNOSIS — E1149 Type 2 diabetes mellitus with other diabetic neurological complication: Secondary | ICD-10-CM | POA: Diagnosis present

## 2013-04-29 DIAGNOSIS — F3289 Other specified depressive episodes: Secondary | ICD-10-CM | POA: Diagnosis present

## 2013-04-29 DIAGNOSIS — F411 Generalized anxiety disorder: Secondary | ICD-10-CM | POA: Diagnosis present

## 2013-04-29 DIAGNOSIS — I10 Essential (primary) hypertension: Secondary | ICD-10-CM

## 2013-04-29 DIAGNOSIS — E039 Hypothyroidism, unspecified: Secondary | ICD-10-CM | POA: Diagnosis present

## 2013-04-29 DIAGNOSIS — E119 Type 2 diabetes mellitus without complications: Secondary | ICD-10-CM | POA: Diagnosis present

## 2013-04-29 DIAGNOSIS — E1159 Type 2 diabetes mellitus with other circulatory complications: Secondary | ICD-10-CM | POA: Diagnosis present

## 2013-04-29 DIAGNOSIS — F329 Major depressive disorder, single episode, unspecified: Secondary | ICD-10-CM

## 2013-04-29 DIAGNOSIS — M129 Arthropathy, unspecified: Secondary | ICD-10-CM | POA: Diagnosis present

## 2013-04-29 DIAGNOSIS — E1142 Type 2 diabetes mellitus with diabetic polyneuropathy: Secondary | ICD-10-CM | POA: Diagnosis present

## 2013-04-29 DIAGNOSIS — G459 Transient cerebral ischemic attack, unspecified: Principal | ICD-10-CM | POA: Diagnosis present

## 2013-04-29 DIAGNOSIS — F339 Major depressive disorder, recurrent, unspecified: Secondary | ICD-10-CM | POA: Diagnosis present

## 2013-04-29 DIAGNOSIS — I509 Heart failure, unspecified: Secondary | ICD-10-CM | POA: Diagnosis present

## 2013-04-29 LAB — POCT I-STAT, CHEM 8
Calcium, Ion: 1.16 mmol/L (ref 1.12–1.23)
Chloride: 99 mEq/L (ref 96–112)
Creatinine, Ser: 0.6 mg/dL (ref 0.50–1.10)
Glucose, Bld: 162 mg/dL — ABNORMAL HIGH (ref 70–99)
Potassium: 3.9 mEq/L (ref 3.5–5.1)

## 2013-04-29 LAB — COMPREHENSIVE METABOLIC PANEL
ALT: 31 U/L (ref 0–35)
AST: 28 U/L (ref 0–37)
CO2: 25 mEq/L (ref 19–32)
Calcium: 10 mg/dL (ref 8.4–10.5)
Chloride: 93 mEq/L — ABNORMAL LOW (ref 96–112)
Creatinine, Ser: 0.58 mg/dL (ref 0.50–1.10)
GFR calc Af Amer: >90 mL/min (ref >90)
GFR calc non Af Amer: >90 mL/min (ref >90)
Glucose, Bld: 165 mg/dL — ABNORMAL HIGH (ref 70–99)
Sodium: 134 mEq/L — ABNORMAL LOW (ref 135–145)
Total Bilirubin: 0.4 mg/dL (ref 0.3–1.2)

## 2013-04-29 LAB — DIFFERENTIAL
Basophils Relative: 1 % (ref 0–1)
Eosinophils Absolute: 0.9 10*3/uL — ABNORMAL HIGH (ref 0.0–0.7)
Eosinophils Relative: 7 % — ABNORMAL HIGH (ref 0–5)
Monocytes Absolute: 0.9 10*3/uL (ref 0.1–1.0)
Neutro Abs: 7.4 10*3/uL (ref 1.7–7.7)
Neutrophils Relative %: 56 % (ref 43–77)

## 2013-04-29 LAB — CBC
HCT: 38.5 % (ref 36.0–46.0)
Hemoglobin: 13.4 g/dL (ref 12.0–15.0)
MCH: 27.9 pg (ref 26.0–34.0)
MCHC: 34.8 g/dL (ref 30.0–36.0)
RBC: 4.8 MIL/uL (ref 3.87–5.11)

## 2013-04-29 MED ORDER — MELOXICAM 15 MG PO TABS
15.0000 mg | ORAL_TABLET | Freq: Every day | ORAL | Status: DC | PRN
  Filled 2013-04-29: qty 1

## 2013-04-29 MED ORDER — DULOXETINE HCL 60 MG PO CPEP
60.0000 mg | ORAL_CAPSULE | Freq: Two times a day (BID) | ORAL | Status: DC
  Administered 2013-04-30 (×2): 60 mg via ORAL
  Filled 2013-04-29 (×3): qty 1

## 2013-04-29 MED ORDER — FUROSEMIDE 40 MG PO TABS
40.0000 mg | ORAL_TABLET | Freq: Every day | ORAL | Status: DC
  Administered 2013-04-30: 40 mg via ORAL
  Filled 2013-04-29: qty 1

## 2013-04-29 MED ORDER — GLIMEPIRIDE 4 MG PO TABS
4.0000 mg | ORAL_TABLET | Freq: Two times a day (BID) | ORAL | Status: DC
  Administered 2013-04-30 (×2): 4 mg via ORAL
  Filled 2013-04-29 (×3): qty 1

## 2013-04-29 MED ORDER — KETOROLAC TROMETHAMINE 30 MG/ML IJ SOLN
30.0000 mg | Freq: Once | INTRAMUSCULAR | Status: AC
  Administered 2013-04-29: 30 mg via INTRAVENOUS
  Filled 2013-04-29: qty 1

## 2013-04-29 MED ORDER — METFORMIN HCL 500 MG PO TABS
1000.0000 mg | ORAL_TABLET | Freq: Two times a day (BID) | ORAL | Status: DC
  Administered 2013-04-30 (×2): 1000 mg via ORAL
  Filled 2013-04-29 (×3): qty 2

## 2013-04-29 MED ORDER — LISINOPRIL 40 MG PO TABS
40.0000 mg | ORAL_TABLET | Freq: Every day | ORAL | Status: DC
  Administered 2013-04-30: 40 mg via ORAL
  Filled 2013-04-29: qty 1

## 2013-04-29 MED ORDER — LEVOTHYROXINE SODIUM 75 MCG PO TABS
75.0000 ug | ORAL_TABLET | Freq: Every day | ORAL | Status: DC
  Administered 2013-04-30: 75 ug via ORAL
  Filled 2013-04-29 (×2): qty 1

## 2013-04-29 MED ORDER — ONDANSETRON HCL 4 MG/2ML IJ SOLN
4.0000 mg | Freq: Three times a day (TID) | INTRAMUSCULAR | Status: AC | PRN
  Administered 2013-04-29: 4 mg via INTRAVENOUS
  Filled 2013-04-29: qty 2

## 2013-04-29 MED ORDER — CARVEDILOL 6.25 MG PO TABS
9.3750 mg | ORAL_TABLET | Freq: Two times a day (BID) | ORAL | Status: DC
  Administered 2013-04-30 (×2): 9.375 mg via ORAL
  Filled 2013-04-29 (×3): qty 1

## 2013-04-29 MED ORDER — ASPIRIN EC 81 MG PO TBEC
81.0000 mg | DELAYED_RELEASE_TABLET | Freq: Every day | ORAL | Status: DC
  Filled 2013-04-29: qty 1

## 2013-04-29 MED ORDER — ACETAMINOPHEN 325 MG PO TABS
650.0000 mg | ORAL_TABLET | ORAL | Status: DC | PRN
  Administered 2013-04-30: 650 mg via ORAL
  Filled 2013-04-29: qty 2

## 2013-04-29 MED ORDER — ALPRAZOLAM 0.5 MG PO TABS
0.5000 mg | ORAL_TABLET | Freq: Every day | ORAL | Status: DC
  Administered 2013-04-30: 0.5 mg via ORAL
  Filled 2013-04-29: qty 1

## 2013-04-29 NOTE — ED Notes (Signed)
Patient transported to CT 
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
## 2013-04-29 NOTE — H&P (Signed)
[facility] History and Physical  Ashley Watts ZOX:[id number] DOB: 1961/05/05 DOA: 04/29/2013  Referring physician: ER physician PCP: No primary provider on file.   Chief Complaint: numbness,  left isded  HPI:  52 year old female with past medical history significant for hypertension, chronic systolic heart failure, hypothyroidism and diabetes who presented to [facility] ED with ongoing numbness over left side face, arm and leg for past 2 weeks prior to this admission. Patient reported associated weakness over the left side of the body but is able to perform all the activities of daily living. Patient reported her symptoms would last for about 10 minutes and then spontaneously resolve. No associated slurred speech, no lightheadedness or loss of consciousness. No chest pain present at this time but she did have few episodes few days ago. No palpitations, no cough, no fever or chills. No abdominal pain, no nausea or vomiting. No blood in stool or urine. In ED, vitals are stable, BP was 143/90. CT head was negative. BMP showed mild hyponatremia of 134. CBC revealed mild leukocytosis of 13.1.  Assessment and Plan:  Principal Problem:   TIA (transient ischemic attack) - TIA order set in place - follow up MRI brain and carotid doppler - 2 D ECHO done recently in 02/2013 with EF of 55% - check lipid panel and A1c Active Problems:   Leukocytosis - likely reactive - no source of infection evident - patient afebrile   Depression - continue cymbalta   Diabetes with complications of neuropathy - check A1c - continue amaryl and metformin - cymbalta for neuropathy related to diabtes   Chronic systolic heart failure - 2 D ECHO in 02/2013 with EF 55-60&; no need to obtain another 2 D ECHO - continue coreg, lisinopril and lasix   HTN (hypertension) - continue coreg, lisinopril   Hypothyroidism - continue synthroid  Manson Passey [facility] [phone]  Review of Systems:  Constitutional:  Negative for fever, chills and malaise/fatigue. Negative for diaphoresis.  HENT: Negative for hearing loss, ear pain, nosebleeds, congestion, sore throat, neck pain, tinnitus and ear discharge.  Eyes: Negative for blurred vision, double vision, photophobia, pain, discharge and redness.  Respiratory: Negative for cough, hemoptysis, sputum production, shortness of breath, wheezing and stridor.   Cardiovascular: positive for chest pain, no palpitations, orthopnea, claudication and leg swelling.  Gastrointestinal: Negative for nausea, vomiting and abdominal pain. Negative for heartburn, constipation, blood in stool and melena.  Genitourinary: Negative for dysuria, urgency, frequency, hematuria and flank pain.  Musculoskeletal: Negative for myalgias, back pain, joint pain and falls.  Skin: Negative for itching and rash.  Neurological: positive for dizziness and weakness. positive for tingling and numbness, no tremors, sensory change, speech change, focal weakness, loss of consciousness and headaches.  Endo/Heme/Allergies: Negative for environmental allergies and polydipsia. Does not bruise/bleed easily.  Psychiatric/Behavioral: Negative for suicidal ideas. The patient is not nervous/anxious.      Past Medical History  Diagnosis Date  . CHF (congestive heart failure)   . Chest pain 11/2012  . Hypertension   . Hypothyroidism   . Depression   . Anxiety   . Shortness of breath   . Diabetes mellitus without complication   . Headache   . Neuromuscular disorder     DIABETIC NEUROPATHY  . Arthritis    Past Surgical History  Procedure Laterality Date  . Abdominal hysterectomy     Social History:  reports that she has never smoked. She has never used smokeless tobacco. She reports that she does not  drink alcohol or use illicit drugs.  Allergies  Allergen Reactions  . Codeine Itching  . Morphine And Related Itching  . Hydrocodone Itching and Rash    Family History: Family medical history  significant for HTN, HLD   Prior to Admission medications   Medication Sig Start Date End Date Taking? Authorizing Provider  ALPRAZolam Prudy Feeler) 0.5 MG tablet Take 0.5 mg by mouth at bedtime.   Yes Historical Provider, MD  aspirin EC 81 MG tablet Take 81 mg by mouth daily.   Yes Historical Provider, MD  carvedilol (COREG) 6.25 MG tablet Take 9.375 mg by mouth 2 (two) times daily with a meal. 9.375mg =1.5 tab 12/04/12  Yes Amy D Clegg, NP  DULoxetine (CYMBALTA) 60 MG capsule Take 60 mg by mouth 2 (two) times daily.   Yes Historical Provider, MD  furosemide (LASIX) 40 MG tablet Take 40 mg by mouth daily.   Yes Historical Provider, MD  glimepiride (AMARYL) 4 MG tablet Take 4 mg by mouth 2 (two) times daily.   Yes Historical Provider, MD  levothyroxine (SYNTHROID, LEVOTHROID) 75 MCG tablet Take 75 mcg by mouth daily.   Yes Historical Provider, MD  lisinopril (PRINIVIL,ZESTRIL) 40 MG tablet Take 40 mg by mouth daily.   Yes Historical Provider, MD  meloxicam (MOBIC) 15 MG tablet Take 15 mg by mouth daily as needed. For pain   Yes Historical Provider, MD  metFORMIN (GLUCOPHAGE) 1000 MG tablet Take 1,000 mg by mouth 2 (two) times daily with a meal.   Yes Historical Provider, MD   Physical Exam: Filed Vitals:   04/29/13 1838  BP: 143/90  Pulse: 88  Temp: 98.1 F (36.7 C)  TempSrc: Oral  Resp: 18  SpO2: 98%    Physical Exam  Constitutional: Appears well-developed and well-nourished. No distress.  HENT: Normocephalic. External right and left ear normal. Oropharynx is clear and moist.  Eyes: Conjunctivae and EOM are normal. PERRLA, no scleral icterus.  Neck: Normal ROM. Neck supple. No JVD. No tracheal deviation. No thyromegaly.  CVS: RRR, S1/S2 +, no murmurs, no gallops, no carotid bruit.  Pulmonary: Effort and breath sounds normal, no stridor, rhonchi, wheezes, rales.  Abdominal: Soft. BS +,  no distension, tenderness, rebound or guarding.  Musculoskeletal: Normal range of motion. No edema  and no tenderness.  Lymphadenopathy: No lymphadenopathy noted, cervical, inguinal. Neuro: Alert. Normal reflexes, muscle tone coordination. No cranial nerve deficit. Skin: Skin is warm and dry. No rash noted. Not diaphoretic. No erythema. No pallor.  Psychiatric: Normal mood and affect. Behavior, judgment, thought content normal.   Labs on Admission:  Basic Metabolic Panel:  Recent Labs Lab 04/29/13 1946 04/29/13 2057  NA 134* 136  K 3.7 3.9  CL 93* 99  CO2 25  --   GLUCOSE 165* 162*  BUN 15 17  CREATININE 0.58 0.60  CALCIUM 10.0  --    Liver Function Tests:  Recent Labs Lab 04/29/13 1946  AST 28  ALT 31  ALKPHOS 83  BILITOT 0.4  PROT 8.2  ALBUMIN 4.3   No results found for this basename: LIPASE, AMYLASE,  in the last 168 hours No results found for this basename: AMMONIA,  in the last 168 hours CBC:  Recent Labs Lab 04/29/13 1946 04/29/13 2057  WBC 13.1*  --   NEUTROABS 7.4  --   HGB 13.4 13.6  HCT 38.5 40.0  MCV 80.2  --   PLT 388  --    Cardiac Enzymes:  Recent Labs Lab 04/29/13 1952  TROPONINI <0.30   BNP: No components found with this basename: POCBNP,  CBG:  Recent Labs Lab 04/29/13 1945  GLUCAP 156*    Radiological Exams on Admission: Ct Head (brain) Wo Contrast 04/29/2013   * IMPRESSION:  1.  No acute intracranial findings.  2 . White matter microvascular disease. 3.  Lacunar infarction within the left internal capsule appears remote.   Original Report Authenticated By: Genevive Bi, M.D.    EKG: Normal sinus rhythm, no ST/T wave changes  Code Status: Full Family Communication: Pt at bedside Disposition Plan: Admit for further evaluation  Manson Passey, MD  [facility] Pager [phone]  If 7PM-7AM, please contact night-coverage [email or web address] Password [facility] 04/29/2013, 9:38 PM

## 2013-04-29 NOTE — ED Provider Notes (Signed)
History     CSN: [id number]  Arrival date & time 04/29/13  5621   First MD Initiated Contact with Patient 04/29/13 1932      Chief Complaint  Patient presents with  . Stroke Symptoms    (Consider location/radiation/quality/duration/timing/severity/associated sxs/prior treatment) HPI Comments: Patient reports intermittent numbness to the left side of her body for the past one week. It involves her face, arm, leg. She's had episodes last about 10 minutes at a time and resolves. His associate weakness in her arm and leg. She denies any difficulty talking or walking. She says she did have some difficulty swallowing with some episodes. Denies any shortness of breath, cough or fever. She has a history of systolic heart failure, hypertension, diabetes. She is intermittent left-sided chest pain lasts a few seconds at a time that she's had for several weeks. Denies any vision change, vertigo, abdominal pain.  The history is provided by the patient.    Past Medical History  Diagnosis Date  . CHF (congestive heart failure)   . Chest pain 11/2012  . Hypertension   . Hypothyroidism   . Depression   . Anxiety   . Shortness of breath   . Diabetes mellitus without complication   . Headache   . Neuromuscular disorder     DIABETIC NEUROPATHY  . Arthritis     Past Surgical History  Procedure Laterality Date  . Abdominal hysterectomy      History reviewed. No pertinent family history.  History  Substance Use Topics  . Smoking status: Never Smoker   . Smokeless tobacco: Never Used  . Alcohol Use: No    OB History   Grav Para Term Preterm Abortions TAB SAB Ect Mult Living                  Review of Systems  Constitutional: Negative for fever, activity change and appetite change.  HENT: Negative for congestion and rhinorrhea.   Respiratory: Positive for chest tightness. Negative for cough.   Cardiovascular: Positive for chest pain.  Gastrointestinal: Negative for nausea,  vomiting and abdominal pain.  Genitourinary: Negative for dysuria, vaginal bleeding and vaginal discharge.  Musculoskeletal: Negative for back pain.  Skin: Negative for rash.  Neurological: Positive for dizziness, speech difficulty, weakness, light-headedness and numbness. Negative for syncope and facial asymmetry.  A complete 10 system review of systems was obtained and all systems are negative except as noted in the HPI and PMH.    Allergies  Codeine; Morphine and related; and Hydrocodone  Home Medications   No current outpatient prescriptions on file.  BP 131/75  Pulse 74  Temp(Src) 97.5 F (36.4 C) (Oral)  Resp 18  Ht 5\' 5"  (1.651 m)  Wt 207 lb 1.6 oz (93.94 kg)  BMI 34.46 kg/m2  SpO2 100%  Physical Exam  Constitutional: She is oriented to person, place, and time. She appears well-developed and well-nourished. No distress.  HENT:  Head: Normocephalic and atraumatic.  Mouth/Throat: Oropharynx is clear and moist. No oropharyngeal exudate.  Eyes: Conjunctivae and EOM are normal. Pupils are equal, round, and reactive to light.  Neck: Normal range of motion. Neck supple.  Cardiovascular: Normal rate, regular rhythm and normal heart sounds.   No murmur heard. Pulmonary/Chest: Effort normal and breath sounds normal. No respiratory distress.  Abdominal: Soft. There is no tenderness. There is no guarding.  Musculoskeletal: Normal range of motion. She exhibits no edema and no tenderness.  Neurological: She is alert and oriented to person, place, and time.  No cranial nerve deficit. She exhibits normal muscle tone. Coordination normal.  CN 2-12 intact, no ataxia on finger to nose, no nystagmus, 5/5 strength throughout, no pronator drift, Romberg negative, normal gait.     ED Course  Procedures (including critical care time)  Labs Reviewed  CBC - Abnormal; Notable for the following:    WBC 13.1 (*)    All other components within normal limits  DIFFERENTIAL - Abnormal; Notable  for the following:    Eosinophils Relative 7 (*)    Eosinophils Absolute 0.9 (*)    All other components within normal limits  COMPREHENSIVE METABOLIC PANEL - Abnormal; Notable for the following:    Sodium 134 (*)    Chloride 93 (*)    Glucose, Bld 165 (*)    All other components within normal limits  GLUCOSE, CAPILLARY - Abnormal; Notable for the following:    Glucose-Capillary 156 (*)    All other components within normal limits  POCT I-STAT, CHEM 8 - Abnormal; Notable for the following:    Glucose, Bld 162 (*)    All other components within normal limits  PROTIME-INR  APTT  TROPONIN I  HEMOGLOBIN A1C  LIPID PANEL  URINE RAPID DRUG SCREEN (HOSP PERFORMED)  POCT I-STAT TROPONIN I   Ct Head (brain) Wo Contrast  04/29/2013   *RADIOLOGY REPORT*  Clinical Data: Stroke-like symptoms  CT HEAD WITHOUT CONTRAST  Technique:  Contiguous axial images were obtained from the base of the skull through the vertex without contrast.  Comparison: Brain MRI 07/08/2004  Findings: No acute intracranial hemorrhage.  No focal mass lesion. No CT evidence of acute infarction.   No midline shift or mass effect.  No hydrocephalus.  Basilar cisterns are patent.  There is a lacunar infarction within the anterior limb of the left internal capsule which is new from prior.  There is mild periventricular subcortical white matter hypodensities.  Paranasal sinuses and mastoid air cells are clear.  Orbits are normal.  IMPRESSION:  1.  No acute intracranial findings.  2 . White matter microvascular disease. 3.  Lacunar infarction within the left internal capsule appears remote.   Original Report Authenticated By: Genevive Bi, M.D.     1. TIA (transient ischemic attack)   2. Chronic systolic heart failure   3. Depression   4. Diabetes       MDM  1 week history of intermittent left-sided numbness involving the face, arm and leg. This is associated weakness at times. No weakness or numbness now. Symptoms lasted  about 10 minutes and go away.  Neuro exam now is nonfocal. Head CT shows chronic microvascular ischemic change CT shows possible remote lacunar infarcts. Concern for TIA given risk factors of diabetes, hypertension, heart disease.  Will obtain MRI, carotid Dopplers, echocardiogram.     Glynn Octave, MD 04/30/13 [id number]

## 2013-04-29 NOTE — ED Notes (Signed)
Pt reports numbness to Left side of her body including her tongue, mouth, and face x1 week. Pt also states a "knot" feeling to Left side of her chest. Pt ambulatory, MAE, no arm drift, no facial droop, grips and strengths equal bilaterally.

## 2013-04-30 ENCOUNTER — Inpatient Hospital Stay (HOSPITAL_COMMUNITY): Payer: Medicare Other

## 2013-04-30 DIAGNOSIS — G459 Transient cerebral ischemic attack, unspecified: Secondary | ICD-10-CM

## 2013-04-30 DIAGNOSIS — E119 Type 2 diabetes mellitus without complications: Secondary | ICD-10-CM

## 2013-04-30 DIAGNOSIS — I5022 Chronic systolic (congestive) heart failure: Secondary | ICD-10-CM

## 2013-04-30 LAB — HEMOGLOBIN A1C: Mean Plasma Glucose: 192 mg/dL — ABNORMAL HIGH (ref <117)

## 2013-04-30 LAB — GLUCOSE, CAPILLARY
Glucose-Capillary: 156 mg/dL — ABNORMAL HIGH (ref 70–99)
Glucose-Capillary: 140 mg/dL — ABNORMAL HIGH (ref 70–99)

## 2013-04-30 LAB — LIPID PANEL
HDL: 34 mg/dL — ABNORMAL LOW (ref >39)
LDL Cholesterol: 63 mg/dL (ref 0–99)
Total CHOL/HDL Ratio: 4.6 RATIO
Triglycerides: 303 mg/dL — ABNORMAL HIGH (ref <150)
VLDL: 61 mg/dL — ABNORMAL HIGH (ref 0–40)

## 2013-04-30 MED ORDER — ATORVASTATIN CALCIUM 40 MG PO TABS: 40.0000 mg | ORAL_TABLET | Freq: Every day | ORAL | Status: DC

## 2013-04-30 MED ORDER — ATORVASTATIN CALCIUM 40 MG PO TABS
40.0000 mg | ORAL_TABLET | Freq: Every day | ORAL | Status: DC
  Administered 2013-04-30: 40 mg via ORAL
  Filled 2013-04-30: qty 1

## 2013-04-30 MED ORDER — ASPIRIN EC 325 MG PO TBEC
325.0000 mg | DELAYED_RELEASE_TABLET | Freq: Every day | ORAL | Status: DC
  Administered 2013-04-30: 325 mg via ORAL
  Filled 2013-04-30: qty 1

## 2013-04-30 MED ORDER — ASPIRIN 325 MG PO TBEC: 325.0000 mg | DELAYED_RELEASE_TABLET | Freq: Every day | ORAL | Status: DC

## 2013-04-30 MED ORDER — INSULIN ASPART 100 UNIT/ML ~~LOC~~ SOLN
0.0000 [IU] | Freq: Three times a day (TID) | SUBCUTANEOUS | Status: DC
  Administered 2013-04-30: 1 [IU] via SUBCUTANEOUS

## 2013-04-30 NOTE — Progress Notes (Signed)
VASCULAR LAB PRELIMINARY  PRELIMINARY  PRELIMINARY  PRELIMINARY  Carotid duplex  completed.    Preliminary report:  Bilateral:  No evidence of hemodynamically significant internal carotid artery stenosis.   Vertebral artery flow is antegrade.      Jarrad Mclees, RVT 04/30/2013, 4:12 PM

## 2013-04-30 NOTE — Progress Notes (Signed)
Discharge Note: Pt is alert and oriented, VS are stable, denies CP.  Telemetry & IV discontinued.  Discharge instructions reviewed with patient, pt verbalizes understanding.  Wheelchair transportation provided, all belongings with patient  

## 2013-04-30 NOTE — Discharge Summary (Signed)
Physician Discharge Summary  ULAH OLMO ZOX:[id number] DOB: 01-Nov-1961 DOA: 04/29/2013  PCP: No primary provider on file.  Admit date: 04/29/2013 Discharge date: 04/30/2013  Recommendations for Outpatient Follow-up:  1. Prescription given for outpatient physical and occupational therapy 2. Follow up with primary care doctor for ongoing diabetes and blood pressure management  Discharge Diagnoses:  Principal Problem:   TIA (transient ischemic attack) Active Problems:   Depression   Diabetes   Chronic systolic heart failure   HTN (hypertension)   Discharge Condition: stable, improved  Diet recommendation: diabetic diet  Wt Readings from Last 3 Encounters:  04/29/13 93.94 kg (207 lb 1.6 oz)  03/01/13 93.328 kg (205 lb 12 oz)  12/04/12 91.989 kg (202 lb 12.8 oz)    History of present illness:   52 year old female with past medical history significant for hypertension, chronic systolic heart failure, hypothyroidism and diabetes who presented to [facility] ED with ongoing numbness over left side face, arm and leg for past 2 weeks prior to this admission. Patient reported associated weakness over the left side of the body but is able to perform all the activities of daily living. Patient reported her symptoms would last for about 10 minutes and then spontaneously resolve. No associated slurred speech, no lightheadedness or loss of consciousness. No chest pain present at this time but she did have few episodes few days ago. No palpitations, no cough, no fever or chills. No abdominal pain, no nausea or vomiting. No blood in stool or urine.  In ED, vitals are stable, BP was 143/90. CT head was negative. BMP showed mild hyponatremia of 134. CBC revealed mild leukocytosis of 13.1.   Hospital Course:   Ms. Schlechter presented with numbness and weakness of the left face and body.  MRI brain demonstrated chronic microvascular ischemia.  Carotid duplex demonstrated no significant carotid arter.   Recent ECHO demonstrated EF of 55%.  Lipid panel demonstrated elevated triglycerides and low HDL.  She was started on atorvastatin.  Hemoglobin A1c was elevated.  Recommend that she also take a full dose aspirin to reduce her risk of stroke.  Alternatively she may have had a complex migraine or were some conversion disorder from worsening depression.  I recommend that she followup with neurology as an outpatient a couple of weeks. I also told her to speak with her primary care doctor who has been managing her depression about switching her to a different antidepressant medication or adding an adjunctive medication to her current regimen.  I will also give her the information for behavioral health as she would likely benefit from ongoing therapy.    Lab Results  Component Value Date   CHOL 158 04/30/2013   HDL 34* 04/30/2013   LDLCALC 63 04/30/2013   TRIG 303* 04/30/2013   CHOLHDL 4.6 04/30/2013   Leukocytosis, likely reactive.  No source of infection evident and patient afebrile.    Depression, stable.  Continued cymbalta  Diabetes with complications of neuropathy.  A1c 8.3.  Patient may resume her previous home medications.  She continued cymbalta for her neuropathy.    Chronic systolic heart failure  - 2 D ECHO in 02/2013 with EF 55-60 - continue coreg, lisinopril and lasix  HTN (hypertension), blood pressure stable.  Continued coreg, lisinopril  Hypothyroidism, stable.  Continue synthroid   Procedures:  MRI brain  Carotid duplex  Consultations:  None  Discharge Exam: Filed Vitals:   04/30/13 1637  BP: 109/67  Pulse: 74  Temp:   Resp:  Filed Vitals:   04/30/13 1000 04/30/13 1200 04/30/13 1327 04/30/13 1637  BP: 125/73 124/67 123/78 109/67  Pulse: 75 72 70 74  Temp: 98 F (36.7 C)  97.6 F (36.4 C)   TempSrc: Oral  Oral   Resp: 14  14   Height:      Weight:      SpO2: 96% 95% 93%    Ms. Collum states that she no longer has any numbness or weakness on the left side  of her body. She states that she often has a headache when they symptoms occur. She gets migraine headaches but these headaches are slightly different in that they occur only on the top of her head.  States she feels depressed.    General: Caucasian female, no acute distress HEENT: NCAT, MMM Cardiovascular: RRR, no murmurs rubs or gallops, 2+ pulses, warm extremities Respiratory: CTAP, no increased work of breathing ABG: Normal active bowel sounds, soft nondistended nontender MSK: No lower extremity edema and normal tone and bulk Neuro: Radial nerves II through XII grossly intact, strength 5 out of 5 throughout, sensation intact to light touch throughout. No dysmetria. The patient has very slow movements of her arms and her legs.   Site: She has a very flat affect, slow to speak and answer questions. And she moves very slowly.  Discharge Instructions      Discharge Orders   Future Orders Complete By Expires     Call MD for:  difficulty breathing, headache or visual disturbances  As directed     Call MD for:  extreme fatigue  As directed     Call MD for:  hives  As directed     Call MD for:  persistant dizziness or light-headedness  As directed     Call MD for:  persistant nausea and vomiting  As directed     Call MD for:  severe uncontrolled pain  As directed     Call MD for:  temperature >100.4  As directed     Diet Carb Modified  As directed     Discharge instructions  As directed     Comments:      You were hospitalized with numbness of the left face, arm, and leg.  You did not have a stroke.  Please make sure you take a full dose aspirin every day and start a cholesterol medication to reduce your risk of having a stroke.  Please see your primary care doctor within 1-2 weeks for ongoing management of your blood pressure, diabetes, and depression.  You should see a neurologist within the next month to monitor for signs of stroke and to determine if you may have complex migraines.  You  may need some additional testing as an outpatient.  I have included the phone number for behavioral health which has some good resources for depression.    Increase activity slowly  As directed         Medication List    TAKE these medications       ALPRAZolam 0.5 MG tablet  Commonly known as:  XANAX  Take 0.5 mg by mouth at bedtime.     aspirin 325 MG EC tablet  Take 1 tablet (325 mg total) by mouth daily.     atorvastatin 40 MG tablet  Commonly known as:  LIPITOR  Take 1 tablet (40 mg total) by mouth daily at 6 PM.     carvedilol 6.25 MG tablet  Commonly known as:  COREG  Take 9.375 mg by mouth 2 (two) times daily with a meal. 9.375mg =1.5 tab     DULoxetine 60 MG capsule  Commonly known as:  CYMBALTA  Take 60 mg by mouth 2 (two) times daily.     furosemide 40 MG tablet  Commonly known as:  LASIX  Take 40 mg by mouth daily.     glimepiride 4 MG tablet  Commonly known as:  AMARYL  Take 4 mg by mouth 2 (two) times daily.     levothyroxine 75 MCG tablet  Commonly known as:  SYNTHROID, LEVOTHROID  Take 75 mcg by mouth daily.     lisinopril 40 MG tablet  Commonly known as:  PRINIVIL,ZESTRIL  Take 40 mg by mouth daily.     meloxicam 15 MG tablet  Commonly known as:  MOBIC  Take 15 mg by mouth daily as needed. For pain     metFORMIN 1000 MG tablet  Commonly known as:  GLUCOPHAGE  Take 1,000 mg by mouth 2 (two) times daily with a meal.       Follow-up Information   Follow up with primary care doctor. Schedule an appointment as soon as possible for a visit in 1 week.      Follow up with Neurology consult. Schedule an appointment as soon as possible for a visit in 2 weeks.      Follow up with [facility]. (walk in accepted)    Contact information:   [address] [place] [address] [phone]      The results of significant diagnostics from this hospitalization (including imaging, microbiology, ancillary and  laboratory) are listed below for reference.    Significant Diagnostic Studies: Ct Head (brain) Wo Contrast  04/29/2013   *RADIOLOGY REPORT*  Clinical Data: Stroke-like symptoms  CT HEAD WITHOUT CONTRAST  Technique:  Contiguous axial images were obtained from the base of the skull through the vertex without contrast.  Comparison: Brain MRI 07/08/2004  Findings: No acute intracranial hemorrhage.  No focal mass lesion. No CT evidence of acute infarction.   No midline shift or mass effect.  No hydrocephalus.  Basilar cisterns are patent.  There is a lacunar infarction within the anterior limb of the left internal capsule which is new from prior.  There is mild periventricular subcortical white matter hypodensities.  Paranasal sinuses and mastoid air cells are clear.  Orbits are normal.  IMPRESSION:  1.  No acute intracranial findings.  2 . White matter microvascular disease. 3.  Lacunar infarction within the left internal capsule appears remote.   Original Report Authenticated By: Genevive Bi, M.D.   Mri Brain Without Contrast  04/30/2013   *RADIOLOGY REPORT*  Clinical Data: Left sided numbness.  TIA  MRI HEAD WITHOUT CONTRAST  Technique:  Multiplanar, multiecho pulse sequences of the brain and surrounding structures were obtained according to standard protocol without intravenous contrast.  Comparison: CT 04/29/2013.  MRI 07/08/2004  Findings: Negative for acute infarct.  Chronic infarct left internal capsule.  Mild chronic microvascular ischemic change in the cerebral white matter.  Hyperintensity in the left pons due to chronic infarction.  Negative for intracranial hemorrhage.  Ventricle size is normal. Negative for mass lesion.  No midline shift.  Paranasal sinuses are clear.  IMPRESSION: Negative for acute infarct.  Chronic microvascular ischemia.   Original Report Authenticated By: Janeece Riggers, M.D.    Microbiology: No results found for this or any previous visit (from the past 240 hour(s)).    Labs: Basic  Metabolic Panel:  Recent Labs Lab 04/29/13 1946 04/29/13 2057  NA 134* 136  K 3.7 3.9  CL 93* 99  CO2 25  --   GLUCOSE 165* 162*  BUN 15 17  CREATININE 0.58 0.60  CALCIUM 10.0  --    Liver Function Tests:  Recent Labs Lab 04/29/13 1946  AST 28  ALT 31  ALKPHOS 83  BILITOT 0.4  PROT 8.2  ALBUMIN 4.3   No results found for this basename: LIPASE, AMYLASE,  in the last 168 hours No results found for this basename: AMMONIA,  in the last 168 hours CBC:  Recent Labs Lab 04/29/13 1946 04/29/13 2057  WBC 13.1*  --   NEUTROABS 7.4  --   HGB 13.4 13.6  HCT 38.5 40.0  MCV 80.2  --   PLT 388  --    Cardiac Enzymes:  Recent Labs Lab 04/29/13 1952  TROPONINI <0.30   BNP: BNP (last 3 results) No results found for this basename: PROBNP,  in the last 8760 hours CBG:  Recent Labs Lab 04/29/13 1945 04/30/13 0732 04/30/13 1153 04/30/13 1645  GLUCAP 156* 156* 219* 140*    Time coordinating discharge: 45 minutes  Signed:  Tanny Harnack  [facility] 04/30/2013, 5:14 PM

## 2013-06-04 ENCOUNTER — Ambulatory Visit: Payer: Medicare Other | Admitting: Neurology

## 2013-06-19 ENCOUNTER — Encounter (HOSPITAL_COMMUNITY): Payer: Self-pay

## 2013-06-19 ENCOUNTER — Emergency Department (HOSPITAL_COMMUNITY)
Admission: EM | Admit: 2013-06-19 | Discharge: 2013-06-19 | Disposition: A | Discharge status: Home / Self Care | Payer: Medicare Other | Attending: Emergency Medicine | Admitting: Emergency Medicine

## 2013-06-19 ENCOUNTER — Ambulatory Visit (INDEPENDENT_AMBULATORY_CARE_PROVIDER_SITE_OTHER): Payer: Medicare Other | Admitting: Neurology

## 2013-06-19 ENCOUNTER — Emergency Department (HOSPITAL_COMMUNITY): Payer: Medicare Other

## 2013-06-19 ENCOUNTER — Encounter: Payer: Self-pay | Admitting: Neurology

## 2013-06-19 VITALS — BP 152/94 | HR 85 | Temp 97.0°F | Ht 64.0 in | Wt 212.0 lb

## 2013-06-19 DIAGNOSIS — I10 Essential (primary) hypertension: Secondary | ICD-10-CM | POA: Insufficient documentation

## 2013-06-19 DIAGNOSIS — F3289 Other specified depressive episodes: Secondary | ICD-10-CM | POA: Insufficient documentation

## 2013-06-19 DIAGNOSIS — Z7982 Long term (current) use of aspirin: Secondary | ICD-10-CM | POA: Insufficient documentation

## 2013-06-19 DIAGNOSIS — R209 Unspecified disturbances of skin sensation: Secondary | ICD-10-CM | POA: Insufficient documentation

## 2013-06-19 DIAGNOSIS — M129 Arthropathy, unspecified: Secondary | ICD-10-CM | POA: Insufficient documentation

## 2013-06-19 DIAGNOSIS — F411 Generalized anxiety disorder: Secondary | ICD-10-CM | POA: Insufficient documentation

## 2013-06-19 DIAGNOSIS — G459 Transient cerebral ischemic attack, unspecified: Secondary | ICD-10-CM

## 2013-06-19 DIAGNOSIS — F329 Major depressive disorder, single episode, unspecified: Secondary | ICD-10-CM | POA: Insufficient documentation

## 2013-06-19 DIAGNOSIS — Z8679 Personal history of other diseases of the circulatory system: Secondary | ICD-10-CM | POA: Insufficient documentation

## 2013-06-19 DIAGNOSIS — Z8669 Personal history of other diseases of the nervous system and sense organs: Secondary | ICD-10-CM | POA: Insufficient documentation

## 2013-06-19 DIAGNOSIS — E1142 Type 2 diabetes mellitus with diabetic polyneuropathy: Secondary | ICD-10-CM | POA: Insufficient documentation

## 2013-06-19 DIAGNOSIS — R200 Anesthesia of skin: Secondary | ICD-10-CM

## 2013-06-19 DIAGNOSIS — Z79899 Other long term (current) drug therapy: Secondary | ICD-10-CM | POA: Insufficient documentation

## 2013-06-19 DIAGNOSIS — E1149 Type 2 diabetes mellitus with other diabetic neurological complication: Secondary | ICD-10-CM | POA: Insufficient documentation

## 2013-06-19 DIAGNOSIS — I509 Heart failure, unspecified: Secondary | ICD-10-CM | POA: Insufficient documentation

## 2013-06-19 DIAGNOSIS — Z8709 Personal history of other diseases of the respiratory system: Secondary | ICD-10-CM | POA: Insufficient documentation

## 2013-06-19 DIAGNOSIS — E119 Type 2 diabetes mellitus without complications: Secondary | ICD-10-CM | POA: Insufficient documentation

## 2013-06-19 LAB — URINALYSIS, ROUTINE W REFLEX MICROSCOPIC
Bilirubin Urine: NEGATIVE
Ketones, ur: 15 mg/dL — AB
Nitrite: NEGATIVE
Protein, ur: NEGATIVE mg/dL
pH: 7 (ref 5.0–8.0)

## 2013-06-19 LAB — CBC WITH DIFFERENTIAL/PLATELET
Eosinophils Absolute: 0.7 10*3/uL (ref 0.0–0.7)
Eosinophils Relative: 10 % — ABNORMAL HIGH (ref 0–5)
HCT: 32 % — ABNORMAL LOW (ref 36.0–46.0)
Hemoglobin: 11.2 g/dL — ABNORMAL LOW (ref 12.0–15.0)
Lymphocytes Relative: 36 % (ref 12–46)
Lymphs Abs: 2.7 10*3/uL (ref 0.7–4.0)
MCH: 28.1 pg (ref 26.0–34.0)
MCV: 80.2 fL (ref 78.0–100.0)
Monocytes Absolute: 0.5 10*3/uL (ref 0.1–1.0)
Monocytes Relative: 6 % (ref 3–12)
RBC: 3.99 MIL/uL (ref 3.87–5.11)
WBC: 7.4 10*3/uL (ref 4.0–10.5)

## 2013-06-19 LAB — COMPREHENSIVE METABOLIC PANEL
ALT: 30 U/L (ref 0–35)
BUN: 15 mg/dL (ref 6–23)
CO2: 26 mEq/L (ref 19–32)
Calcium: 9.5 mg/dL (ref 8.4–10.5)
Creatinine, Ser: 0.68 mg/dL (ref 0.50–1.10)
GFR calc Af Amer: >90 mL/min (ref >90)
GFR calc non Af Amer: >90 mL/min (ref >90)
Glucose, Bld: 215 mg/dL — ABNORMAL HIGH (ref 70–99)
Sodium: 137 mEq/L (ref 135–145)
Total Protein: 7 g/dL (ref 6.0–8.3)

## 2013-06-19 LAB — TROPONIN I: Troponin I: <0.3 ng/mL (ref <0.30)

## 2013-06-19 LAB — PROTIME-INR: INR: 0.91 (ref 0.00–1.49)

## 2013-06-19 NOTE — ED Notes (Signed)
Pt at [facility] and MD there feels pt demonstrated s/s of stroke.  Hx of TIA.  Symptoms resolved.  CBG 203.  Pt has not taken her home meds today.  ? Non-compliant.  12 lead  NSR.  20G to L hand.  EMS reports neuro intact and stroke scale negative.

## 2013-06-19 NOTE — Consult Note (Signed)
NEURO HOSPITALIST CONSULT NOTE    Reason for Consult: transient left sided weakness  HPI:                                                                                                                                          Ashley Watts is an 52 y.o. female with long history of migraine HA but she is unable to fully describe her HA.  She can tell me that they are often in her neck and in the back of her head.  Over the past 4 months she has been experiencing off and on, often daily and sometimes more than once a day, feeling of transient left face, arm and leg decreased sensation.  She was to have a Dr. Appointment with Dr. Frances Furbish from [facility] today. She was in [facility] office talking with the nurse when she felt left face, arm and leg decrease sensation.  Due to these symptoms she was brought to [facility] hospital ED for evaluation. At time of evaluation her symptoms had resolved.  On exam she has a very flat affect and is very vague in her description of her symptoms.  She was recently seen at [facility] on 04/29/13 for similar symptoms.  MRI head was negative, LDL was normal, A1c was elevated at 8.1, carotid dopplers normal.   Past Medical History  Diagnosis Date  . CHF (congestive heart failure)   . Chest pain 11/2012  . Hypertension   . Hypothyroidism   . Depression   . Anxiety   . Shortness of breath   . Diabetes mellitus without complication   . Headache(784.0)   . Neuromuscular disorder     DIABETIC NEUROPATHY  . Arthritis     Past Surgical History  Procedure Laterality Date  . Abdominal hysterectomy      Family History: Mother HTN Father HTN  Social History:  reports that she has never smoked. She has never used smokeless tobacco. She reports that she does not drink alcohol or use illicit drugs.  Allergies  Allergen Reactions  . Codeine Itching  . Morphine And Related Itching  . Hydrocodone Itching and Rash    MEDICATIONS:  No current facility-administered medications for this encounter.   Current Outpatient Prescriptions  Medication Sig Dispense Refill  . ALPRAZolam (XANAX) 0.5 MG tablet Take 0.5 mg by mouth at bedtime.      [name] aspirin EC 325 MG EC tablet Take 1 tablet (325 mg total) by mouth daily.  30 tablet  0  . atorvastatin (LIPITOR) 40 MG tablet Take 1 tablet (40 mg total) by mouth daily at 6 PM.  30 tablet  0  . carvedilol (COREG) 6.25 MG tablet Take 9.375 mg by mouth 2 (two) times daily with a meal. 9.375mg =1.5 tab      . DULoxetine (CYMBALTA) 60 MG capsule Take 60 mg by mouth 2 (two) times daily.      . furosemide (LASIX) 40 MG tablet Take 40 mg by mouth daily.      [name] glimepiride (AMARYL) 4 MG tablet Take 4 mg by mouth 2 (two) times daily.      [name] levothyroxine (SYNTHROID, LEVOTHROID) 75 MCG tablet Take 75 mcg by mouth daily.      [name] lisinopril (PRINIVIL,ZESTRIL) 40 MG tablet Take 40 mg by mouth daily.      . meloxicam (MOBIC) 15 MG tablet Take 15 mg by mouth daily as needed. For pain      . metFORMIN (GLUCOPHAGE) 1000 MG tablet Take 1,000 mg by mouth 2 (two) times daily with a meal.          ROS:                                                                                                                                       History obtained from the patient  General ROS: negative for - chills, fatigue, fever, night sweats, weight gain or weight loss Psychological ROS: negative for - behavioral disorder, hallucinations, memory difficulties, mood swings or suicidal ideation Ophthalmic ROS: negative for - blurry vision, double vision, eye pain or loss of vision ENT ROS: negative for - epistaxis, nasal discharge, oral lesions, sore throat, tinnitus or vertigo Allergy and Immunology ROS: negative for - hives or itchy/watery eyes Hematological and Lymphatic ROS: negative for - bleeding problems, bruising or  swollen lymph nodes Endocrine ROS: negative for - galactorrhea, hair pattern changes, polydipsia/polyuria or temperature intolerance Respiratory ROS: negative for - cough, hemoptysis, shortness of breath or wheezing Cardiovascular ROS: negative for - chest pain, dyspnea on exertion, edema or irregular heartbeat Gastrointestinal ROS: negative for - abdominal pain, diarrhea, hematemesis, nausea/vomiting or stool incontinence Genito-Urinary ROS: negative for - dysuria, hematuria, incontinence or urinary frequency/urgency Musculoskeletal ROS: negative for - joint swelling or muscular weakness Neurological ROS: as noted in HPI Dermatological ROS: negative for rash and skin lesion changes   Blood pressure 137/83, pulse 80, temperature 98.4 F (36.9 C), temperature source Oral, resp. rate 18, SpO2 97.00%.   Neurologic Examination:  Mental Status: Alert, oriented, thought content appropriate,  flat affect.  Speech fluent without evidence of aphasia.  Able to follow 3 step commands without difficulty. Cranial Nerves: II: Discs flat bilaterally; Visual fields grossly normal, pupils equal, round, reactive to light and accommodation III,IV, VI: ptosis not present, extra-ocular motions intact bilaterally V,VII: smile symmetric, facial light touch sensation normal bilaterally VIII: hearing normal bilaterally IX,X: gag reflex present XI: bilateral shoulder shrug XII: midline tongue extension Motor: Right : Upper extremity   5/5    Left:     Upper extremity   5/5  Lower extremity   5/5     Lower extremity   5/5 Tone and bulk:normal tone throughout; no atrophy noted Sensory: Pinprick and light touch intact throughout, bilaterally Deep Tendon Reflexes:  Right: Upper Extremity   Left: Upper extremity   biceps (C-5 to C-6) 2/4   biceps (C-5 to C-6) 2/4 tricep (C7) 2/4    triceps (C7)  2/4 Brachioradialis (C6) 2/4  Brachioradialis (C6) 2/4  Lower Extremity Lower Extremity  quadriceps (L-2 to L-4) 2/4   quadriceps (L-2 to L-4) 2/4 Achilles (S1) 2/4   Achilles (S1) 2/4  Plantars: Right: downgoing   Left: downgoing Cerebellar: normal finger-to-nose,  normal heel-to-shin test CV: pulses palpable throughout    Lab Results  Component Value Date/Time   CHOL 158 04/30/2013  5:40 AM    Results for orders placed during the hospital encounter of 06/19/13 (from the past 48 hour(s))  CBC WITH DIFFERENTIAL     Status: Abnormal   Collection Time    06/19/13 10:25 AM      Result Value Range   WBC 7.4  4.0 - 10.5 K/uL   RBC 3.99  3.87 - 5.11 MIL/uL   Hemoglobin 11.2 (*) 12.0 - 15.0 g/dL   HCT 78.2 (*) 95.6 - 21.3 %   MCV 80.2  78.0 - 100.0 fL   MCH 28.1  26.0 - 34.0 pg   MCHC 35.0  30.0 - 36.0 g/dL   RDW 08.6  57.8 - 46.9 %   Platelets 331  150 - 400 K/uL   Neutrophils Relative % 47  43 - 77 %   Neutro Abs 3.5  1.7 - 7.7 K/uL   Lymphocytes Relative 36  12 - 46 %   Lymphs Abs 2.7  0.7 - 4.0 K/uL   Monocytes Relative 6  3 - 12 %   Monocytes Absolute 0.5  0.1 - 1.0 K/uL   Eosinophils Relative 10 (*) 0 - 5 %   Eosinophils Absolute 0.7  0.0 - 0.7 K/uL   Basophils Relative 1  0 - 1 %   Basophils Absolute 0.1  0.0 - 0.1 K/uL  PROTIME-INR     Status: None   Collection Time    06/19/13 10:25 AM      Result Value Range   Prothrombin Time 12.1  11.6 - 15.2 seconds   INR 0.91  0.00 - 1.49  APTT     Status: None   Collection Time    06/19/13 10:25 AM      Result Value Range   aPTT 29  24 - 37 seconds    No results found.   Assessment/Plan: 52 YO with transient left face, arm and leg which has now resolved. Possible simple partial seizure, migraine. Also possible would be conversion though other etiologies need to be ruled out first.     Recommend: 1) MRI brain. If negative for stroke would not continue stroke workup.  2) If MRI is negative we have spoken to Dr.  Frances Furbish and she will arrange an EEG as out patient.    Assessment and plan discussed with with attending physician and they are in agreement.    Felicie Morn PA-C [facility] [phone]  06/19/2013, 11:06 AM    I have seen and evaluated the patient. I have reviewed the above note and made appropriate changes. Recurrent episodes of left sided numbness and weakness. She has 10 - 15 per week. During an event I was able to evaluate her and she had some give-way weakness and split the midline to vibration. MRI brain would be reasonable, but if negative I feel that this would be very unlikely to be vascular. Given the longstanding occurrence without permanent injury or lasting effects, I feel that this workup could be completed as an outpatient if MRI is normal. I spoke with Dr. Frances Furbish who will arrange an outpatient EEG through her office. Recurrent migrainous aura also possible, though the frequency would be very unusual.   Ritta Slot, MD [facility] [phone]  If 7pm- 7am, please page neurology on call at [phone].

## 2013-06-19 NOTE — ED Notes (Signed)
Patient is in MRI.  

## 2013-06-19 NOTE — ED Notes (Signed)
Neurology at bedside.

## 2013-06-19 NOTE — ED Notes (Signed)
CBG 209. 

## 2013-06-19 NOTE — ED Notes (Signed)
ERMD ok for patient to take her morning bp medication.  She took lisinopril 40mg  po after passing swallow screen

## 2013-06-19 NOTE — ED Notes (Signed)
Patient has returned from MRI.  Denies any tingling or weakness at this time.  She is able to move the left leg without resistance at this time.  Patient states she is feeling some better.  She reports her sx having been coming and going

## 2013-06-19 NOTE — ED Provider Notes (Addendum)
History    CSN: [id number] Arrival date & time 06/19/13  4098  First MD Initiated Contact with Patient 06/19/13 1004     Chief Complaint  Patient presents with  . Stroke Symptoms   (Consider location/radiation/quality/duration/timing/severity/associated sxs/prior Treatment) HPI Pt sent from [facility] neurology office for acute onset L sided numbness starting at 0845 today. Neurologist eval'd and gave referred to ED for possible TIA/CVA. NIH SS score of 3. Pt states she has had multiple episodes of similar symptoms in the past, most recently in 5/14 when she was admitted. Negative work up inclinding MRI. Pt states symptoms have mostly resolved at this point though she is still having some mild lower l face numbness. No visual changes. No weakness.  Past Medical History  Diagnosis Date  . CHF (congestive heart failure)   . Chest pain 11/2012  . Hypertension   . Hypothyroidism   . Depression   . Anxiety   . Shortness of breath   . Diabetes mellitus without complication   . Headache(784.0)   . Neuromuscular disorder     DIABETIC NEUROPATHY  . Arthritis    Past Surgical History  Procedure Laterality Date  . Abdominal hysterectomy     No family history on file. History  Substance Use Topics  . Smoking status: Never Smoker   . Smokeless tobacco: Never Used  . Alcohol Use: No   OB History   Grav Para Term Preterm Abortions TAB SAB Ect Mult Living                 Review of Systems  Constitutional: Negative for fever and chills.  HENT: Negative for neck pain.   Eyes: Negative for visual disturbance.  Respiratory: Negative for shortness of breath.   Cardiovascular: Negative for chest pain.  Gastrointestinal: Negative for nausea, vomiting and abdominal pain.  Genitourinary: Negative for dysuria.  Musculoskeletal: Negative for myalgias and back pain.  Skin: Negative for pallor, rash and wound.  Neurological: Positive for numbness and headaches. Negative for dizziness,  syncope, weakness and light-headedness.  All other systems reviewed and are negative.    Allergies  Codeine; Morphine and related; and Hydrocodone  Home Medications   Current Outpatient Rx  Name  Route  Sig  Dispense  Refill  . ALPRAZolam (XANAX) 0.5 MG tablet   Oral   Take 0.5 mg by mouth at bedtime.         [name] aspirin EC 325 MG EC tablet   Oral   Take 1 tablet (325 mg total) by mouth daily.   30 tablet   0   . atorvastatin (LIPITOR) 40 MG tablet   Oral   Take 1 tablet (40 mg total) by mouth daily at 6 PM.   30 tablet   0   . carvedilol (COREG) 6.25 MG tablet   Oral   Take 9.375 mg by mouth 2 (two) times daily with a meal. 9.375mg =1.5 tab         . DULoxetine (CYMBALTA) 60 MG capsule   Oral   Take 60 mg by mouth 2 (two) times daily.         . furosemide (LASIX) 40 MG tablet   Oral   Take 40 mg by mouth daily.         [name] glimepiride (AMARYL) 4 MG tablet   Oral   Take 4 mg by mouth 2 (two) times daily.         [name] levothyroxine (SYNTHROID, LEVOTHROID) 75 MCG tablet  Oral   Take 75 mcg by mouth daily.         [name] lisinopril (PRINIVIL,ZESTRIL) 40 MG tablet   Oral   Take 40 mg by mouth daily.         . meloxicam (MOBIC) 15 MG tablet   Oral   Take 15 mg by mouth daily as needed. For pain         . metFORMIN (GLUCOPHAGE) 1000 MG tablet   Oral   Take 1,000 mg by mouth 2 (two) times daily with a meal.          BP 186/91  Pulse 84  Temp(Src) 98.4 F (36.9 C) (Oral)  Resp 16  SpO2 100% Physical Exam  Nursing note and vitals reviewed. Constitutional: She is oriented to person, place, and time. She appears well-developed and well-nourished. No distress.  HENT:  Head: Normocephalic and atraumatic.  Mouth/Throat: Oropharynx is clear and moist.  Eyes: EOM are normal. Pupils are equal, round, and reactive to light.  Neck: Normal range of motion. Neck supple.  Cardiovascular: Normal rate and regular rhythm.   Pulmonary/Chest: Effort normal and  breath sounds normal. No respiratory distress. She has no wheezes. She has no rales.  Abdominal: Soft. Bowel sounds are normal. She exhibits no distension and no mass. There is no tenderness. There is no rebound and no guarding.  Musculoskeletal: Normal range of motion. She exhibits no edema and no tenderness.  Neurological: She is alert and oriented to person, place, and time.  5/5 motor in all ext, CN II-XII intact, finger to nose intact. Decreased sensation to light touch L lower face. Visual fields intact.   Skin: Skin is warm and dry. No rash noted. No erythema.  Psychiatric:  Flat affect    ED Course  Procedures (including critical care time) Labs Reviewed  CBC WITH DIFFERENTIAL - Abnormal; Notable for the following:    Hemoglobin 11.2 (*)    HCT 32.0 (*)    Eosinophils Relative 10 (*)    All other components within normal limits  COMPREHENSIVE METABOLIC PANEL - Abnormal; Notable for the following:    Glucose, Bld 215 (*)    All other components within normal limits  URINALYSIS, ROUTINE W REFLEX MICROSCOPIC - Abnormal; Notable for the following:    Glucose, UA 500 (*)    Ketones, ur 15 (*)    All other components within normal limits  TROPONIN I  PROTIME-INR  APTT   Mr Brain Wo Contrast  06/19/2013   *RADIOLOGY REPORT*  Clinical Data: Left-sided weakness.  Stroke.  MRI HEAD WITHOUT CONTRAST  Technique:  Multiplanar, multiecho pulse sequences of the brain and surrounding structures were obtained according to standard protocol without intravenous contrast.  Comparison: MRI 04/30/2013  Findings: Negative for acute infarct.  Ventricle size is normal.  Chronic infarct left internal capsule is unchanged.  Mild chronic microvascular ischemic change in the white matter.  Negative for intracranial hemorrhage or mass lesion.  No shift of the midline structures.  Paranasal sinuses are clear.  IMPRESSION: No acute abnormality.  Chronic microvascular ischemic changes, unchanged from the prior  MRI.   Original Report Authenticated By: Janeece Riggers, M.D.   1. Left sided numbness   2. Numbness of face     Date: 06/19/2013  Rate: 77  Rhythm: normal sinus rhythm  QRS Axis: normal  Intervals: normal  ST/T Wave abnormalities: normal  Conduction Disutrbances:none  Narrative Interpretation:   Old EKG Reviewed: unchanged   MDM  Discussed with Dr  Kirkpatrick at [facility]. Stated code stroke does not need to be activated given minor symptoms. Will see in ED. Neuro eval'd pt. Does not believe pt is having TIA/CVA symptoms. Suggest MRI to confirm. If no acute findings can d/c home to f/u with outpt neurology.   Loren Racer, MD 06/19/13 1322  Pt advised not to drive or operate heavy machinery until cleared by neurologist due to concern for possible seizures. Pt voiced understanding.   Loren Racer, MD 06/19/13 1330

## 2013-06-19 NOTE — ED Notes (Addendum)
When pt is laying in bed, lip seems to have very mild droop.  When pt smiles, lips are symmetrical.  Pt's grips equal bilaterally.  Plantar/dorsiflexion of feet equal bilaterally.  Pt's left leg drifts.  Pt encouraged to hold leg up for complete count, pt states she is unable.

## 2013-06-19 NOTE — Progress Notes (Signed)
Subjective:    Patient ID: Ashley Watts is a 52 y.o. female.  HPI  Huston Foley, MD, PhD [facility] [address], [address] [address] [place], [place] [address]  Dear Ashley Watts,   I saw your patient, Ashley Watts., upon your kind request in my neurologic clinic today for initial consultation of her left-sided numbness. The patient is unaccompanied today. As you know, Ashley Watts is a very friendly 52 year old right-handed woman with an underlying medical history of hypothyroidism, diabetes, reflux disease, headaches, depression with anxiety and hypertension, who has been experiencing paresthesias and numbness on the left side. She was hospitalized at [facility] for sudden onset of symptoms. Her current medications are Mobic, baby aspirin, Glucophage, Xanax, furosemide, lisinopril, Amaryl, Cymbalta, Synthroid. Please note, that when I saw her she reported sudden onset of L sided weakness and numbness since 8:45 and I saw her at 8:55 and she reported L facial numbness and weakness and L hemibody numbness and weakness. We call 911 from the office and EMS arrived at 9:17. Of note the patient says that she did not take her aspirin today. She did not take her blood glucose medicine or her blood pressure medicine today and does not explain exactly why. She did take her thyroid medicine. Of note she was seen at [facility] ER on 04/29/2013 with similar presentation and had a head CT which was negative for any acute findings. There were old lacunar strokes. She had a carotid Doppler study which showed less than 39% stenosis on both internal carotid arteries and she had had an echocardiogram before recently. The patient's blood glucose level in the office was 203. She denied any orthostatic lightheadedness but did complain of mild chest pain.   Her Past Medical History Is Significant For: Past Medical History  Diagnosis Date  . CHF (congestive heart failure)   . Chest  pain 11/2012  . Hypertension   . Hypothyroidism   . Depression   . Anxiety   . Shortness of breath   . Diabetes mellitus without complication   . Headache(784.0)   . Neuromuscular disorder     DIABETIC NEUROPATHY  . Arthritis     Her Past Surgical History Is Significant For: Past Surgical History  Procedure Laterality Date  . Abdominal hysterectomy      Her Family History Is Significant For: No family history on file.  Her Social History Is Significant For: History   Social History  . Marital Status: Divorced    Spouse Name: N/A    Number of Children: N/A  . Years of Education: N/A   Social History Main Topics  . Smoking status: Never Smoker   . Smokeless tobacco: Never Used  . Alcohol Use: No  . Drug Use: No  . Sexually Active: Not Currently   Other Topics Concern  . None   Social History Narrative  . None    Her Allergies Are:  Allergies  Allergen Reactions  . Codeine Itching  . Morphine And Related Itching  . Hydrocodone Itching and Rash  :   Her Current Medications Are:  Outpatient Encounter Prescriptions as of 06/19/2013  Medication Sig Dispense Refill  . ALPRAZolam (XANAX) 0.5 MG tablet Take 0.5 mg by mouth at bedtime.      [name] aspirin EC 325 MG EC tablet Take 1 tablet (325 mg total) by mouth daily.  30 tablet  0  . atorvastatin (LIPITOR) 40 MG tablet Take 1 tablet (40 mg total) by  mouth daily at 6 PM.  30 tablet  0  . carvedilol (COREG) 6.25 MG tablet Take 9.375 mg by mouth 2 (two) times daily with a meal. 9.375mg =1.5 tab      . DULoxetine (CYMBALTA) 60 MG capsule Take 60 mg by mouth 2 (two) times daily.      [name] erythromycin ophthalmic ointment       . furosemide (LASIX) 40 MG tablet Take 40 mg by mouth daily.      [name] glimepiride (AMARYL) 4 MG tablet Take 4 mg by mouth 2 (two) times daily.      [name] levothyroxine (SYNTHROID, LEVOTHROID) 75 MCG tablet Take 75 mcg by mouth daily.      [name] lisinopril (PRINIVIL,ZESTRIL) 40 MG tablet Take 40 mg by mouth daily.       . meloxicam (MOBIC) 15 MG tablet Take 15 mg by mouth daily as needed. For pain      . metFORMIN (GLUCOPHAGE) 1000 MG tablet Take 1,000 mg by mouth 2 (two) times daily with a meal.       No facility-administered encounter medications on file as of 06/19/2013.  : Review of Systems  Constitutional: Positive for fatigue and unexpected weight change.  HENT: Positive for rhinorrhea, trouble swallowing and tinnitus.   Eyes: Positive for visual disturbance (blurred vision).  Respiratory: Positive for cough.   Cardiovascular: Positive for chest pain.  Genitourinary:       Incontinence  Musculoskeletal: Positive for joint swelling and arthralgias.  Neurological: Positive for dizziness, facial asymmetry, speech difficulty, weakness, light-headedness, numbness and headaches.       Memory loss  Psychiatric/Behavioral: Positive for suicidal ideas, confusion, sleep disturbance, dysphoric mood, decreased concentration and agitation. The patient is nervous/anxious.     Objective:  Neurologic Exam  Physical Exam Physical Examination:   Filed Vitals:   06/19/13 0839  BP: 152/94  Pulse: 85  Temp: 97 F (36.1 C)    General Examination: The patient is a very pleasant 52 y.o. female in no acute distress. She appears well-developed and well-nourished and adequately groomed.   HEENT: Normocephalic, atraumatic, pupils are equal, round and reactive to light and accommodation. Extraocular tracking is good without limitation to gaze excursion or nystagmus noted. Normal smooth pursuit is noted. Visual fields are grossly intact. Hearing is grossly intact. Tympanic membranes are clear bilaterally. Face is mildly asymmetric with a slight flattening of the left nasolabial fold noted. She has mild decrease in pinprick sensation in the left entire hemiface. Speech is soft and slow without clear dysarthria noted. There is no lip, neck/head, jaw or voice tremor. Neck is supple with full range of passive and active  motion. There are no carotid bruits on auscultation. Oropharynx exam reveals: mild mouth dryness, adequate dental hygiene and mild airway crowding. Mallampati is class II. Tongue protrudes centrally and palate elevates symmetrically.   Chest: Clear to auscultation without wheezing, rhonchi or crackles noted.  Heart: S1+S2+0, regular and normal without murmurs, rubs or gallops noted.   Abdomen: Soft, non-tender and non-distended with normal bowel sounds appreciated on auscultation.  Extremities: There is trace pitting edema in the distal lower extremities bilaterally. Pedal pulses are intact.  Skin: Warm and dry without trophic changes noted. There are no varicose veins.  Musculoskeletal: exam reveals no obvious joint deformities, tenderness or joint swelling or erythema.   Neurologically:   NIH stroke scale score was 3 at 9:05 d/t mild L facial weakness, mild L leg drift and mild sensory loss on the L hemibody.  Mental status: The patient is awake, alert and oriented in all 4 spheres. Her memory, attention, language and knowledge are fairly appropriate, but she is slow to respond, not overtly dysarthric, no clear effusion noted either. She does take longer to respond but is able to do so appropriately. She answers in very short sentences however. There is no aphasia, agnosia, apraxia or anomia. Thought process is linear. Mood is congruent and affect is constricted.  Cranial nerves are as described above under HEENT exam. In addition, shoulder shrug is normal with equal shoulder height noted. Motor exam: Normal bulk, and tone is noted. There is no drift, except of a mild initial left leg drift, no tremor or rebound. Reflexes are 2+ throughout. Toes are downgoing bilaterally. Fine motor skills are intact in the upper extremities, with slight difficulty in the left foot. Cerebellar testing shows no dysmetria or intention tremor on finger to nose testing. There is no truncal or gait ataxia. She  is able to stand up on her own and walks slowly and cautiously. She is able to turn. I did not check Romberg. Sensory exam is intact to light touch, pinprick, vibration, temperature sense on the right side but on the left she has decreased pinprick and temperature sensation in the left hemibody.     Assessment and Plan:   Assessment and Plan:  In summary, LILLIEMAE FRUGE is a very pleasant 52 y.o.-year old female with a history of hypertension, hypothyroidism, depression, anxiety, and diabetes who has a history of intermittent left-sided symptoms including numbness, tingling and weakness for the past several months as understand but when I started seeing her in the office she complained of acute left-sided weakness that started about 5 minutes prior to my stepping into the exam room. Again, her NIH stroke scale was about 3 at the time of my exam. We called 911 EMT arrived fairly soon after that and we checked a blood glucose level which was 203 at the time. She may be having another TIA; in addition she is complaining of mild chest pain which needs to be looked into. She was diagnosed with TIA in May of this year. I do feel it is safe is that she be transported to the emergency room at [facility] which we initiated. I will be happy to see the patient back after she is discharged from the hospital.   Thank you very much for allowing me to participate in the care of this nice patient. If I can be of any further assistance to you please do not hesitate to call me at [phone].  Sincerely,   Huston Foley, MD, PhD

## 2013-07-02 ENCOUNTER — Encounter: Payer: Self-pay | Admitting: Neurology

## 2013-07-02 ENCOUNTER — Ambulatory Visit (INDEPENDENT_AMBULATORY_CARE_PROVIDER_SITE_OTHER): Payer: Medicare Other | Admitting: Neurology

## 2013-07-02 VITALS — BP 159/90 | HR 79 | Ht 64.0 in | Wt 214.0 lb

## 2013-07-02 DIAGNOSIS — F3289 Other specified depressive episodes: Secondary | ICD-10-CM

## 2013-07-02 DIAGNOSIS — F329 Major depressive disorder, single episode, unspecified: Secondary | ICD-10-CM

## 2013-07-02 DIAGNOSIS — M47812 Spondylosis without myelopathy or radiculopathy, cervical region: Secondary | ICD-10-CM

## 2013-07-02 DIAGNOSIS — R569 Unspecified convulsions: Secondary | ICD-10-CM

## 2013-07-02 DIAGNOSIS — M542 Cervicalgia: Secondary | ICD-10-CM

## 2013-07-02 DIAGNOSIS — R5383 Other fatigue: Secondary | ICD-10-CM

## 2013-07-02 DIAGNOSIS — R531 Weakness: Secondary | ICD-10-CM

## 2013-07-02 DIAGNOSIS — R5381 Other malaise: Secondary | ICD-10-CM

## 2013-07-02 NOTE — Progress Notes (Signed)
Subjective:    Patient ID: Ashley Watts is a 52 y.o. female.  HPI  Interim history:   Ashley Watts is a 52 year old right-handed woman who presents for followup consultation after a recent visit with me on 06/19/2013. At the time she presented with a history of intermittent left-sided numbness and weakness.At the time of her office visit with me on 06/19/2013 she reported sudden onset of left-sided numbness and weakness which prompted me to call 911. She was taken to [facility]. Of note she was seen at the emergency room at [facility] on 04/29/2013 with a similar presentation and had a head CT which was negative for acute findings but she had had old lacunar strokes. She had a carotid Doppler study which showed less than 39% stenoses on both internal carotid arteries and had a recent echocardiogram. At the time of her visit with me she had not taken her blood pressure medication and had also not taken her diabetes medication. Her NIH stroke scale score was 3 based on mild left nasolabial fold flattening mild left leg drift and mild sensory loss in the left hemibody. She was fully awake and alert and was taken to the emergency room from there. She has had multiple episodes of similar nature in the recent past and had negative workup including negative MRI. I discussed her presentation to the emergency room Dr. and we mutually agreed that she would benefit from an outpatient EEG. MRI on 06/19/2013 showed no acute abnormality, chronic microvascular ischemic changes, unchanged from prior MRI which was from 04/30/2013. She feels okay today, denies one sided weakness, numbness, tingling, slurring of speech or droopy face, hearing loss, tinnitus, diplopia or visual field cut or monocular loss of vision CP, SOB. She has not been very active physically and attributes this to her depression. She denies SI/HI. She has had SI in the past. She had therapy in the distant past.  She c/o neck pain  with radiation to the posterior head. She had an MRI c spine in 2005, which showed mild degenerative findings.   Her Past Medical History Is Significant For: Past Medical History  Diagnosis Date  . CHF (congestive heart failure)   . Chest pain 11/2012  . Hypertension   . Hypothyroidism   . Depression   . Anxiety   . Shortness of breath   . Diabetes mellitus without complication   . Headache(784.0)   . Neuromuscular disorder     DIABETIC NEUROPATHY  . Arthritis     Her Past Surgical History Is Significant For: Past Surgical History  Procedure Laterality Date  . Abdominal hysterectomy      Her Family History Is Significant For: No family history on file.  Her Social History Is Significant For: History   Social History  . Marital Status: Divorced    Spouse Name: N/A    Number of Children: N/A  . Years of Education: N/A   Social History Main Topics  . Smoking status: Never Smoker   . Smokeless tobacco: Never Used  . Alcohol Use: No  . Drug Use: No  . Sexually Active: Not Currently   Other Topics Concern  . Not on file   Social History Narrative  . No narrative on file    Her Allergies Are:  Allergies  Allergen Reactions  . Codeine Itching  . Morphine And Related Itching  . Hydrocodone Itching and Rash  :   Her Current Medications Are:  Outpatient Encounter Prescriptions as of  07/02/2013  Medication Sig Dispense Refill  . ALPRAZolam (XANAX) 0.5 MG tablet Take 0.5 mg by mouth at bedtime.      [name] aspirin EC 325 MG EC tablet Take 1 tablet (325 mg total) by mouth daily.  30 tablet  0  . atorvastatin (LIPITOR) 40 MG tablet Take 1 tablet (40 mg total) by mouth daily at 6 PM.  30 tablet  0  . carvedilol (COREG) 6.25 MG tablet Take 9.375 mg by mouth 2 (two) times daily with a meal. 9.375mg =1.5 tab      . DULoxetine (CYMBALTA) 60 MG capsule Take 60 mg by mouth 2 (two) times daily.      . furosemide (LASIX) 40 MG tablet Take 40 mg by mouth daily.      [name] glimepiride  (AMARYL) 4 MG tablet Take 4 mg by mouth 2 (two) times daily.      [name] levothyroxine (SYNTHROID, LEVOTHROID) 75 MCG tablet Take 75 mcg by mouth daily.      [name] lisinopril (PRINIVIL,ZESTRIL) 40 MG tablet Take 40 mg by mouth daily.      . meloxicam (MOBIC) 15 MG tablet Take 15 mg by mouth daily as needed. For pain      . metFORMIN (GLUCOPHAGE) 1000 MG tablet Take 1,000 mg by mouth 2 (two) times daily with a meal.       No facility-administered encounter medications on file as of 07/02/2013.  : Review of Systems  Constitutional: Positive for activity change and appetite change.  HENT:       Ringing in Right ear  Eyes:       Blurred Vision  Cardiovascular: Positive for chest pain.  Endocrine:       Increased thirst  Musculoskeletal:       Cramps  Allergic/Immunologic: Positive for environmental allergies.  Neurological: Positive for dizziness, weakness and headaches.       Confusion, Memory Loss  Psychiatric/Behavioral: Positive for sleep disturbance.       Depression, Anxiety, Racing Thought    Objective:  Neurologic Exam  Physical Exam Physical Examination:   Filed Vitals:   07/02/13 1158  BP: 159/90  Pulse: 79    General Examination: The patient is a very pleasant 52 y.o. female in no acute distress. She appears well-developed and well-nourished and well groomed. Some psychomotor slowing noted.  HEENT: Normocephalic, atraumatic, pupils are equal, round and reactive to light and accommodation. Extraocular tracking is good without limitation to gaze excursion or nystagmus noted. Normal smooth pursuit is noted. Hearing is grossly intact. Tympanic membranes are clear bilaterally. Face is symmetric with normal facial animation and normal facial sensation. Speech is clear with no dysarthria noted. There is no hypophonia. There is no lip, neck/head, jaw or voice tremor. Neck is supple with full range of passive and active motion. There are no carotid bruits on auscultation. Oropharynx exam  reveals: moderate mouth dryness, adequate dental hygiene and mild airway crowding. Mallampati is class II. Tongue protrudes centrally and palate elevates symmetrically.   Chest: Clear to auscultation without wheezing, rhonchi or crackles noted.  Heart: S1+S2+0, regular and normal without murmurs, rubs or gallops noted.   Abdomen: Soft, non-tender and non-distended with normal bowel sounds appreciated on auscultation.  Extremities: There is 1+ pitting edema in the distal lower extremities bilaterally. Pedal pulses are intact.  Skin: Warm and dry without trophic changes noted. There are no varicose veins.  Musculoskeletal: exam reveals no obvious joint deformities, tenderness or joint swelling or erythema.   Neurologically:  Mental status: The patient is awake, alert and oriented in all 4 spheres. Her memory, attention, language and knowledge are appropriate. There is no aphasia, agnosia, apraxia or anomia. Speech is clear with normal prosody and enunciation. Thought process is linear. Mood is congruent and affect is blunted.  Cranial nerves are as described above under HEENT exam. In addition, shoulder shrug is normal with equal shoulder height noted. Motor exam: Normal bulk, strength and tone is noted. There is no drift, tremor or rebound. Romberg is negative. Reflexes are 2+ throughout. Toes are downgoing bilaterally. Fine motor skills are intact with normal finger taps, normal hand movements, normal rapid alternating patting, normal foot taps and normal foot agility.  Cerebellar testing shows no dysmetria or intention tremor on finger to nose testing. Heel to shin is unremarkable bilaterally. There is no truncal or gait ataxia.  Sensory exam is intact to light touch, pinprick, vibration, temperature sense and proprioception in the upper and lower extremities.  Gait, station and balance are unremarkable. No veering to one side is noted. No leaning to one side is noted. Posture is age-appropriate  and stance is narrow based. No problems turning are noted. She turns en bloc. Tandem walk is unremarkable.             Assessment and Plan:   Assessment and Plan:  In summary, Ashley Watts is a very pleasant 52 y.o.-year old female with a history of recurrent L sided numbness and weakness, with recent visit to ER twice in the last 2 months with negative w/u. Her physical exam is stable and fairly non-focal, but she appears depressed and endorses depression. She c/o neck pain as well. She reports having a Hx of manic-depression. I will refer her to a psychiatrist today as she wishes to proceed with that and I will order an MRI c spine and EEG today.  I had a long chat with the patient about my findings and her symptoms and explained her test results to her in detail. We talked about medical treatments and non-pharmacological approaches. We talked about maintaining a healthy lifestyle in general. I encouraged the patient to eat healthy, exercise daily and keep well hydrated, to keep a scheduled bedtime and wake time routine, to not skip any meals and eat healthy snacks in between meals and to have protein with every meal.   As far as further diagnostic testing is concerned, I suggested the following today: EEG, MRI neck.  As far as medications are concerned, I recommended the following at this time: no change. She is advised to continue with daily ASA. I answered all her questions today and the patient was in agreement with the above outlined plan. I would like to see the patient back in 3 months, sooner if the need arises and encouraged her to call with any interim questions, concerns, problems or updates and test results.

## 2013-07-02 NOTE — Patient Instructions (Signed)
I think overall you are doing fairly well but I do want to suggest a few things today:  Remember to drink plenty of fluid, eat healthy meals and do not skip any meals. Try to eat protein with a every meal and eat a healthy snack such as fruit or nuts in between meals. Try to keep a regular sleep-wake schedule and try to exercise daily, particularly in the form of walking, 20-30 minutes a day, if you can.   As far as your medications are concerned, I would like to suggest no changes.    As far as diagnostic testing: MRI neck and EEG. Referral to psychiatry.  I would like to see you back in 3 months, sooner if we need to. Please call us with any interim questions, concerns, problems, updates or refill requests.  Please also call us for any test results so we can go over those with you on the phone. Brett Canales is my clinical assistant and will answer any of your questions and relay your messages to me and also relay most of my messages to you.  Our phone number is [phone]. We also have an after hours call service for urgent matters and there is a physician on-call for urgent questions. For any emergencies you know to call 911 or go to the nearest emergency room.

## 2013-07-03 ENCOUNTER — Telehealth: Payer: Self-pay | Admitting: *Deleted

## 2013-07-03 ENCOUNTER — Telehealth: Payer: Self-pay | Admitting: Neurology

## 2013-07-03 NOTE — Telephone Encounter (Signed)
Attempted to contact pt to get PCP information however her VM was full and I was unable to leave a message.

## 2013-07-09 ENCOUNTER — Other Ambulatory Visit: Payer: Medicare Other

## 2013-07-11 ENCOUNTER — Ambulatory Visit (INDEPENDENT_AMBULATORY_CARE_PROVIDER_SITE_OTHER): Payer: Medicare Other

## 2013-07-11 DIAGNOSIS — R569 Unspecified convulsions: Secondary | ICD-10-CM

## 2013-07-11 DIAGNOSIS — R531 Weakness: Secondary | ICD-10-CM

## 2013-07-11 NOTE — Progress Notes (Signed)
Quick Note:  Please call and advise the patient that the EEG or brain wave test we performed was reported as normal in the awake state. We checked for abnormal electrical brain wave activity and it was reported as normal. No further action is required on this test at this time. Please remind patient to keep any upcoming appointments or tests and to call us with any interim questions, concerns, problems or updates. Thanks,  Huston Foley, MD, PhD    ______

## 2013-07-11 NOTE — Procedures (Signed)
  History:  Ashley Watts is a 52 year old patient with episodes of intermittent left-sided numbness and weakness of sudden onset. The patient has undergone a cerebrovascular disease that was unremarkable. The patient is being evaluated for possible seizures.  This is a routine EEG. No skull defects are noted. Medications include alprazolam, aspirin, Lipitor, Coreg, Cymbalta, Lasix, Amaryl, Synthroid, lisinopril, Mobic, and metformin.  EEG classification: Normal awake  Description of the recording: The background rhythms of this recording consists of a fairly well modulated medium amplitude alpha rhythm of 11 Hz that is reactive to eye opening and closure. As the record progresses, the patient appears to remain in the waking state throughout the recording. Photic stimulation was performed, resulting in a bilateral and symmetric photic driving response. Hyperventilation was also performed, resulting in a minimal buildup of the background rhythm activities without significant slowing seen. At no time during the recording does there appear to be evidence of spike or spike wave discharges or evidence of focal slowing. EKG monitor shows no evidence of cardiac rhythm abnormalities with a heart rate of 84.  Impression: This is a normal EEG recording in the waking state. No evidence of ictal or interictal discharges are seen.

## 2013-07-12 ENCOUNTER — Telehealth: Payer: Self-pay

## 2013-07-12 ENCOUNTER — Inpatient Hospital Stay
Admission: RE | Admit: 2013-07-12 | Discharge: 2013-07-12 | Disposition: A | Discharge status: Home / Self Care | Payer: Medicare Other | Source: Ambulatory Visit | Attending: Neurology | Admitting: Neurology

## 2013-07-12 DIAGNOSIS — R531 Weakness: Secondary | ICD-10-CM

## 2013-07-12 DIAGNOSIS — M542 Cervicalgia: Secondary | ICD-10-CM

## 2013-07-12 NOTE — Telephone Encounter (Signed)
Message copied by [facility] on Fri Jul 12, 2013  4:10 PM ------      Message from: Huston Foley      Created: Thu Jul 11, 2013  5:24 PM       Please call and advise the patient that the EEG or brain wave test we performed was reported as normal in the awake state. We checked for abnormal electrical brain wave activity and it was reported as normal. No further action is required on this test at this time. Please remind patient to keep any upcoming appointments or tests and to call us with any interim questions, concerns, problems or updates. Thanks,       Huston Foley, MD, PhD                   ------

## 2013-07-12 NOTE — Telephone Encounter (Signed)
I called and left patient a VM that her EEG was normal. No further actions required at this time. Please keep any upcoming appointments or tests and call us with any interim questions, concerns, problems or updates

## 2013-07-17 ENCOUNTER — Other Ambulatory Visit: Payer: Self-pay | Admitting: Neurology

## 2013-07-17 DIAGNOSIS — M542 Cervicalgia: Secondary | ICD-10-CM

## 2013-07-18 ENCOUNTER — Inpatient Hospital Stay: Admission: RE | Admit: 2013-07-18 | Payer: Medicare Other | Source: Ambulatory Visit

## 2013-07-24 ENCOUNTER — Telehealth: Payer: Self-pay | Admitting: *Deleted

## 2013-07-24 ENCOUNTER — Ambulatory Visit
Admission: RE | Admit: 2013-07-24 | Discharge: 2013-07-24 | Disposition: A | Discharge status: Home / Self Care | Payer: Medicare Other | Source: Ambulatory Visit | Attending: Neurology | Admitting: Neurology

## 2013-07-24 DIAGNOSIS — R209 Unspecified disturbances of skin sensation: Secondary | ICD-10-CM

## 2013-07-24 DIAGNOSIS — M542 Cervicalgia: Secondary | ICD-10-CM

## 2013-07-24 NOTE — Telephone Encounter (Signed)
Called pt to advise that York Spaniel, MD advised Dr Tenny Craw that she will need a f/u apt in the fall of this year, offered pt apt to be scheduled in oct/november, pt noted she does not have her calender with her and will have to call back and schedule this apt at her convience

## 2013-07-26 NOTE — Progress Notes (Signed)
Quick Note:  Please advise patient that her recent C-spine MRI without contrast showed mild degenerative changes. No significant compression of any exiting nerves and no changes to the spinal cord.  Huston Foley, MD, PhD [facility] ([facility]  ______

## 2013-08-01 NOTE — Progress Notes (Signed)
Quick Note:  Spoke with patient and relayed MRI C-spine results, per Dr. Frances Furbish. ______

## 2013-10-01 ENCOUNTER — Other Ambulatory Visit (HOSPITAL_COMMUNITY): Payer: Self-pay | Admitting: Internal Medicine

## 2013-11-05 ENCOUNTER — Ambulatory Visit: Payer: Medicare Other | Admitting: Neurology

## 2013-12-06 ENCOUNTER — Other Ambulatory Visit (HOSPITAL_COMMUNITY): Payer: Self-pay | Admitting: Internal Medicine

## 2014-01-10 ENCOUNTER — Other Ambulatory Visit (HOSPITAL_COMMUNITY): Payer: Self-pay | Admitting: Internal Medicine

## 2014-01-24 DIAGNOSIS — S6990XA Unspecified injury of unspecified wrist, hand and finger(s), initial encounter: Secondary | ICD-10-CM | POA: Diagnosis not present

## 2014-01-24 DIAGNOSIS — E039 Hypothyroidism, unspecified: Secondary | ICD-10-CM | POA: Diagnosis not present

## 2014-01-24 DIAGNOSIS — IMO0001 Reserved for inherently not codable concepts without codable children: Secondary | ICD-10-CM | POA: Diagnosis not present

## 2014-01-24 DIAGNOSIS — I10 Essential (primary) hypertension: Secondary | ICD-10-CM | POA: Diagnosis not present

## 2014-02-06 ENCOUNTER — Other Ambulatory Visit (HOSPITAL_COMMUNITY): Payer: Self-pay | Admitting: Internal Medicine

## 2014-03-05 ENCOUNTER — Encounter: Payer: Self-pay | Admitting: Cardiology

## 2014-03-05 ENCOUNTER — Ambulatory Visit (INDEPENDENT_AMBULATORY_CARE_PROVIDER_SITE_OTHER): Payer: Medicare Other | Admitting: Cardiology

## 2014-03-05 VITALS — BP 135/76 | HR 89 | Ht 65.0 in | Wt 209.0 lb

## 2014-03-05 DIAGNOSIS — E039 Hypothyroidism, unspecified: Secondary | ICD-10-CM

## 2014-03-05 DIAGNOSIS — I272 Pulmonary hypertension, unspecified: Secondary | ICD-10-CM

## 2014-03-05 DIAGNOSIS — I10 Essential (primary) hypertension: Secondary | ICD-10-CM

## 2014-03-05 DIAGNOSIS — I5022 Chronic systolic (congestive) heart failure: Secondary | ICD-10-CM

## 2014-03-05 DIAGNOSIS — I428 Other cardiomyopathies: Secondary | ICD-10-CM | POA: Diagnosis not present

## 2014-03-05 DIAGNOSIS — E119 Type 2 diabetes mellitus without complications: Secondary | ICD-10-CM | POA: Diagnosis not present

## 2014-03-05 DIAGNOSIS — I2789 Other specified pulmonary heart diseases: Secondary | ICD-10-CM

## 2014-03-05 NOTE — Patient Instructions (Signed)
Your physician recommends that you schedule a follow-up appointment in: 6 months with Dr Wyline Mood  Your physician recommends that you return for lab work this week. TSH, Hgb A1C, CBC, CMET, Mag, Lipid  Your physician has recommended you make the following change in your medication:  Start asa 81 mg daily, stop 325 mg  Get over the counter Ibuprofen 400 mg take tree times a day for 5 days  Your physician has requested that you have an echocardiogram. Echocardiography is a painless test that uses sound waves to create images of your heart. It provides your doctor with information about the size and shape of your heart and how well your heart's chambers and valves are working. This procedure takes approximately one hour. There are no restrictions for this procedure.

## 2014-03-05 NOTE — Progress Notes (Signed)
Clinical Summary Ashley Watts is a 53 y.o.female last seen by Dr Tenny Crawoss, this is our first visit together.   1. NICM - 2013 cath showed patent coronaries, LVEF 20-25% with global hypokinesis, CI 2.4, PCWP 15, mean PA 40 - repeat echo in March showed normalization of LVEF at 55-60% and grade I diastolic dysfunction. Normal RV  - reports DOE has improved from previous. SOB after walking approx 15 minutes currently . No orthopnea, no PND, no LE edema - compliant with meds  2. HTN - does not check at home - compliant with meds, though has not taken yet today  3. Hyperlipidemia - compliant with lipitor - no recent panel in our system  4. Chest discomfort - started 2-3 day ago. Pressure like feeling left chest. Continous for 2-3 days. Nothing makes better worst. 5/10. No other associated symptoms Past Medical History  Diagnosis Date  . CHF (congestive heart failure)   . Chest pain 11/2012  . Hypertension   . Hypothyroidism   . Depression   . Anxiety   . Shortness of breath   . Diabetes mellitus without complication   . Headache(784.0)   . Neuromuscular disorder     DIABETIC NEUROPATHY  . Arthritis      Allergies  Allergen Reactions  . Codeine Itching  . Morphine And Related Itching  . Hydrocodone Itching and Rash     Current Outpatient Prescriptions  Medication Sig Dispense Refill  . ALPRAZolam (XANAX) 0.5 MG tablet Take 0.5 mg by mouth at bedtime.      [name]. aspirin EC 325 MG EC tablet Take 1 tablet (325 mg total) by mouth daily.  30 tablet  0  . atorvastatin (LIPITOR) 40 MG tablet Take 1 tablet (40 mg total) by mouth daily at 6 PM.  30 tablet  0  . carvedilol (COREG) 6.25 MG tablet Take 9.375 mg by mouth 2 (two) times daily with a meal. 9.375mg =1.5 tab      . DULoxetine (CYMBALTA) 60 MG capsule Take 60 mg by mouth 2 (two) times daily.      . furosemide (LASIX) 40 MG tablet TAKE 1 TABLET DAILY, NEED TO CALL MD FOR FOLLOW UP APPOINTMENT  30 tablet  0  . glimepiride  (AMARYL) 4 MG tablet Take 4 mg by mouth 2 (two) times daily.      [name]. levothyroxine (SYNTHROID, LEVOTHROID) 75 MCG tablet Take 75 mcg by mouth daily.      [name]. lisinopril (PRINIVIL,ZESTRIL) 40 MG tablet TAKE 1 TABLET DAILY, NEED TO SCHEDULE APPOINTMENT.  30 tablet  6  . meloxicam (MOBIC) 15 MG tablet Take 15 mg by mouth daily as needed. For pain      . metFORMIN (GLUCOPHAGE) 1000 MG tablet Take 1,000 mg by mouth 2 (two) times daily with a meal.       No current facility-administered medications for this visit.     Past Surgical History  Procedure Laterality Date  . Abdominal hysterectomy       Allergies  Allergen Reactions  . Codeine Itching  . Morphine And Related Itching  . Hydrocodone Itching and Rash      No family history on file.   Social History Ashley Watts reports that she has never smoked. She has never used smokeless tobacco. Ashley Watts reports that she does not drink alcohol.   Review of Systems CONSTITUTIONAL: No weight loss, fever, chills, weakness or fatigue.  HEENT: Eyes: No visual loss, blurred vision, double vision or yellow sclerae.No  hearing loss, sneezing, congestion, runny nose or sore throat.  SKIN: No rash or itching.  CARDIOVASCULAR: per HPI RESPIRATORY: No shortness of breath, cough or sputum.  GASTROINTESTINAL: No anorexia, nausea, vomiting or diarrhea. No abdominal pain or blood.  GENITOURINARY: No burning on urination, no polyuria NEUROLOGICAL: No headache, dizziness, syncope, paralysis, ataxia, numbness or tingling in the extremities. No change in bowel or bladder control.  MUSCULOSKELETAL: No muscle, back pain, joint pain or stiffness.  LYMPHATICS: No enlarged nodes. No history of splenectomy.  PSYCHIATRIC: No history of depression or anxiety.  ENDOCRINOLOGIC: No reports of sweating, cold or heat intolerance. No polyuria or polydipsia.  [name]   Physical Examination p 89 bp 135/76 Wt 209 lbs BMI 35 Gen: resting comfortably, no acute  distress HEENT: no scleral icterus, pupils equal round and reactive, no palptable cervical adenopathy,  CV: RRR, no m/r/g, no JVD, no carotid bruits Resp: Clear to auscultation bilaterally GI: abdomen is soft, non-tender, non-distended, normal bowel sounds, no hepatosplenomegaly MSK: extremities are warm, no edema.  Skin: warm, no rash Neuro:  no focal deficits Psych: appropriate affect   Diagnostic Studies 11/2012 [facility] Procedural Findings:  Hemodynamics  RA 7/5 with a mean of 4 mmHg  RV 49/8 mmHg  PA 54/30 with a mean of 40 mmHg  PCWP 22/19 with a mean of 15 mmHg  LV 169/20 mmHg  AO 173/107 with a mean of 139 mmHg  There is no significant aortic or mitral valve gradient.  Oxygen saturations:  PA 59%  AO 93%  Cardiac Output (Fick) 4.74 L per minute  Cardiac Index (Fick) 2.42 L per minute per meter square  Coronary angiography:  Coronary dominance: right  Left mainstem: Normal.  Left anterior descending (LAD): Normal.  Left circumflex (LCx): Normal.  Right coronary artery (RCA): Normal.  Left ventriculography: Left ventricular size is mildly increased. There is severe global hypokinesis with ejection fraction of 20-25%. There is minimal mitral insufficiency.  Final Conclusions:  1. Normal coronary anatomy.  2. Severe left ventricular dysfunction.  3. Mild to moderate pulmonary hypertension.  Recommendations: We'll optimize her medical therapy for treatment of her congestive heart failure. We'll ask heart failure team to evaluate.   02/2013 Echo LVEF 55-60%, mild LVH, grade I diastolic dysfunction,   04/2013 Carotid US Findings consistent with less than 39 percent stenosis involving the right internal carotid artery and the left internal carotid artery. - Bilateral vertebral artery flow is antegrade. Other specific details can be found in the table(s) above. Prepared and Electronically Authenticated by  03/05/14 Clinic EKG NSR, RAD, no ischemic changes  Assessment and  Plan  1. NICM - previously had severe LV dysfunction, has since normalized by most recent echo 02/2013 - reports stable DOE after walking approx 15 minutes - continue current meds  2. Pulm HTN - elevated PA on RHC 11/2012, transpulm gradient of 25. This was in the setting of her severe LV systolic dysfunction, however by the numbers her elevated PA was not explained by left sided disease - clinic EKG shows right axis deviation - repeat echo to reassess PA pressures and right sided function  3. HTN - at goal, continue current meds  4. Atypical chest pain - non-cardiac pain under left breast, constant x 3 days  - EKG in clinic shows no ischemic changes - will give ibuprofen 400mg  tid x 5 days, instructed to stay well hydrated while on NSAID   F/u 6 months  Antoine Poche, M.D., F.A.C.C.

## 2014-03-11 ENCOUNTER — Ambulatory Visit (HOSPITAL_COMMUNITY): Admission: RE | Admit: 2014-03-11 | Payer: Medicare Other | Source: Ambulatory Visit

## 2014-03-11 NOTE — Addendum Note (Signed)
Addended by: Burnice Logan on: 03/11/2014 09:38 AM   Modules accepted: Orders

## 2014-03-17 ENCOUNTER — Other Ambulatory Visit (HOSPITAL_COMMUNITY): Payer: Self-pay | Admitting: Internal Medicine

## 2014-03-31 ENCOUNTER — Other Ambulatory Visit: Payer: Self-pay | Admitting: *Deleted

## 2014-03-31 MED ORDER — LISINOPRIL 40 MG PO TABS: 40.0000 mg | ORAL_TABLET | Freq: Every day | ORAL | Status: DC

## 2014-04-14 ENCOUNTER — Ambulatory Visit (HOSPITAL_COMMUNITY)
Admission: RE | Admit: 2014-04-14 | Discharge: 2014-04-14 | Disposition: A | Discharge status: Home / Self Care | Payer: Medicare Other | Source: Ambulatory Visit | Attending: Cardiology | Admitting: Cardiology

## 2014-04-14 DIAGNOSIS — I2789 Other specified pulmonary heart diseases: Secondary | ICD-10-CM | POA: Insufficient documentation

## 2014-04-14 DIAGNOSIS — I517 Cardiomegaly: Secondary | ICD-10-CM | POA: Diagnosis not present

## 2014-04-14 DIAGNOSIS — I509 Heart failure, unspecified: Secondary | ICD-10-CM | POA: Insufficient documentation

## 2014-04-14 DIAGNOSIS — I10 Essential (primary) hypertension: Secondary | ICD-10-CM | POA: Diagnosis not present

## 2014-04-14 DIAGNOSIS — I69959 Hemiplegia and hemiparesis following unspecified cerebrovascular disease affecting unspecified side: Secondary | ICD-10-CM | POA: Diagnosis not present

## 2014-04-14 DIAGNOSIS — I272 Pulmonary hypertension, unspecified: Secondary | ICD-10-CM

## 2014-04-14 DIAGNOSIS — I519 Heart disease, unspecified: Secondary | ICD-10-CM | POA: Insufficient documentation

## 2014-04-14 DIAGNOSIS — E119 Type 2 diabetes mellitus without complications: Secondary | ICD-10-CM | POA: Diagnosis not present

## 2014-04-14 DIAGNOSIS — I5022 Chronic systolic (congestive) heart failure: Secondary | ICD-10-CM | POA: Diagnosis not present

## 2014-04-14 NOTE — Progress Notes (Signed)
*  PRELIMINARY RESULTS* Echocardiogram 2D Echocardiogram has been performed.  Ashley Watts 04/14/2014, 9:56 AM

## 2014-04-15 LAB — LIPID PANEL
CHOL/HDL RATIO: 4.2 ratio
Cholesterol: 127 mg/dL (ref 0–200)
HDL: 30 mg/dL — ABNORMAL LOW (ref >39)
LDL CALC: 18 mg/dL (ref 0–99)
Triglycerides: 393 mg/dL — ABNORMAL HIGH (ref <150)
VLDL: 79 mg/dL — AB (ref 0–40)

## 2014-04-15 LAB — CBC
HCT: 35.9 % — ABNORMAL LOW (ref 36.0–46.0)
HEMOGLOBIN: 11.9 g/dL — AB (ref 12.0–15.0)
MCH: 27.4 pg (ref 26.0–34.0)
MCHC: 33.1 g/dL (ref 30.0–36.0)
MCV: 82.5 fL (ref 78.0–100.0)
PLATELETS: 366 10*3/uL (ref 150–400)
RBC: 4.35 MIL/uL (ref 3.87–5.11)
RDW: 14.7 % (ref 11.5–15.5)
WBC: 7.5 10*3/uL (ref 4.0–10.5)

## 2014-04-15 LAB — TSH: TSH: 6.946 u[IU]/mL — ABNORMAL HIGH (ref 0.350–4.500)

## 2014-04-15 LAB — COMPREHENSIVE METABOLIC PANEL
ALBUMIN: 4.3 g/dL (ref 3.5–5.2)
ALK PHOS: 74 U/L (ref 39–117)
ALT: 42 U/L — AB (ref 0–35)
AST: 36 U/L (ref 0–37)
BILIRUBIN TOTAL: 0.3 mg/dL (ref 0.2–1.2)
BUN: 20 mg/dL (ref 6–23)
CO2: 23 mEq/L (ref 19–32)
Calcium: 9.3 mg/dL (ref 8.4–10.5)
Chloride: 99 mEq/L (ref 96–112)
Creat: 0.66 mg/dL (ref 0.50–1.10)
GLUCOSE: 283 mg/dL — AB (ref 70–99)
POTASSIUM: 4.5 meq/L (ref 3.5–5.3)
SODIUM: 134 meq/L — AB (ref 135–145)
TOTAL PROTEIN: 7.1 g/dL (ref 6.0–8.3)

## 2014-04-15 LAB — HEMOGLOBIN A1C
HEMOGLOBIN A1C: 10 % — AB (ref <5.7)
MEAN PLASMA GLUCOSE: 240 mg/dL — AB (ref <117)

## 2014-04-15 LAB — MAGNESIUM: MAGNESIUM: 1.4 mg/dL — AB (ref 1.5–2.5)

## 2014-04-16 ENCOUNTER — Telehealth: Payer: Self-pay | Admitting: *Deleted

## 2014-04-16 NOTE — Telephone Encounter (Signed)
Entered in error

## 2014-04-21 ENCOUNTER — Encounter: Payer: Self-pay | Admitting: *Deleted

## 2014-04-21 NOTE — Addendum Note (Signed)
Addended by: Thompson Grayer on: 04/21/2014 08:15 AM   Modules accepted: Orders

## 2014-05-22 DIAGNOSIS — IMO0001 Reserved for inherently not codable concepts without codable children: Secondary | ICD-10-CM | POA: Diagnosis not present

## 2014-07-28 ENCOUNTER — Other Ambulatory Visit (HOSPITAL_COMMUNITY): Payer: Self-pay | Admitting: Internal Medicine

## 2014-08-15 DIAGNOSIS — N949 Unspecified condition associated with female genital organs and menstrual cycle: Secondary | ICD-10-CM | POA: Diagnosis not present

## 2014-08-15 DIAGNOSIS — B3731 Acute candidiasis of vulva and vagina: Secondary | ICD-10-CM | POA: Diagnosis not present

## 2014-08-15 DIAGNOSIS — B373 Candidiasis of vulva and vagina: Secondary | ICD-10-CM | POA: Diagnosis not present

## 2014-08-15 DIAGNOSIS — R300 Dysuria: Secondary | ICD-10-CM | POA: Diagnosis not present

## 2014-08-25 ENCOUNTER — Other Ambulatory Visit (HOSPITAL_COMMUNITY): Payer: Self-pay | Admitting: Internal Medicine

## 2014-08-25 ENCOUNTER — Other Ambulatory Visit (HOSPITAL_COMMUNITY): Payer: Self-pay | Admitting: Cardiology

## 2014-08-26 NOTE — Telephone Encounter (Signed)
Medication sent to pharmacy  

## 2014-08-29 DIAGNOSIS — Z90710 Acquired absence of both cervix and uterus: Secondary | ICD-10-CM | POA: Diagnosis not present

## 2014-08-29 DIAGNOSIS — N83209 Unspecified ovarian cyst, unspecified side: Secondary | ICD-10-CM | POA: Diagnosis not present

## 2014-08-29 DIAGNOSIS — N949 Unspecified condition associated with female genital organs and menstrual cycle: Secondary | ICD-10-CM | POA: Diagnosis not present

## 2014-09-24 ENCOUNTER — Other Ambulatory Visit (HOSPITAL_COMMUNITY): Payer: Self-pay | Admitting: Internal Medicine

## 2014-09-26 ENCOUNTER — Telehealth: Payer: Self-pay

## 2014-09-26 MED ORDER — LISINOPRIL 40 MG PO TABS: 40.0000 mg | ORAL_TABLET | Freq: Every day | ORAL | Status: DC

## 2014-09-26 NOTE — Telephone Encounter (Signed)
refill 

## 2014-10-15 DIAGNOSIS — Z79899 Other long term (current) drug therapy: Secondary | ICD-10-CM | POA: Diagnosis not present

## 2014-10-24 ENCOUNTER — Other Ambulatory Visit: Payer: Self-pay | Admitting: Internal Medicine

## 2014-10-27 ENCOUNTER — Ambulatory Visit: Payer: Medicare Other | Admitting: Internal Medicine

## 2014-10-29 ENCOUNTER — Emergency Department (HOSPITAL_COMMUNITY): Payer: Medicare Other

## 2014-10-29 ENCOUNTER — Emergency Department (HOSPITAL_COMMUNITY)
Admission: EM | Admit: 2014-10-29 | Discharge: 2014-10-29 | Disposition: A | Discharge status: Home / Self Care | Payer: Medicare Other | Attending: Emergency Medicine | Admitting: Emergency Medicine

## 2014-10-29 ENCOUNTER — Encounter (HOSPITAL_COMMUNITY): Payer: Self-pay | Admitting: Cardiology

## 2014-10-29 DIAGNOSIS — M1990 Unspecified osteoarthritis, unspecified site: Secondary | ICD-10-CM | POA: Insufficient documentation

## 2014-10-29 DIAGNOSIS — Z792 Long term (current) use of antibiotics: Secondary | ICD-10-CM | POA: Diagnosis not present

## 2014-10-29 DIAGNOSIS — F419 Anxiety disorder, unspecified: Secondary | ICD-10-CM | POA: Diagnosis not present

## 2014-10-29 DIAGNOSIS — R110 Nausea: Secondary | ICD-10-CM | POA: Insufficient documentation

## 2014-10-29 DIAGNOSIS — K219 Gastro-esophageal reflux disease without esophagitis: Secondary | ICD-10-CM | POA: Diagnosis not present

## 2014-10-29 DIAGNOSIS — E039 Hypothyroidism, unspecified: Secondary | ICD-10-CM | POA: Diagnosis not present

## 2014-10-29 DIAGNOSIS — E1165 Type 2 diabetes mellitus with hyperglycemia: Secondary | ICD-10-CM | POA: Diagnosis not present

## 2014-10-29 DIAGNOSIS — Z7982 Long term (current) use of aspirin: Secondary | ICD-10-CM | POA: Insufficient documentation

## 2014-10-29 DIAGNOSIS — I10 Essential (primary) hypertension: Secondary | ICD-10-CM | POA: Insufficient documentation

## 2014-10-29 DIAGNOSIS — Z79899 Other long term (current) drug therapy: Secondary | ICD-10-CM | POA: Insufficient documentation

## 2014-10-29 DIAGNOSIS — R0789 Other chest pain: Secondary | ICD-10-CM | POA: Diagnosis not present

## 2014-10-29 DIAGNOSIS — K2100 Gastro-esophageal reflux disease with esophagitis, without bleeding: Secondary | ICD-10-CM

## 2014-10-29 DIAGNOSIS — R079 Chest pain, unspecified: Secondary | ICD-10-CM

## 2014-10-29 DIAGNOSIS — I509 Heart failure, unspecified: Secondary | ICD-10-CM | POA: Insufficient documentation

## 2014-10-29 LAB — BASIC METABOLIC PANEL
ANION GAP: 16 — AB (ref 5–15)
BUN: 12 mg/dL (ref 6–23)
CALCIUM: 9.4 mg/dL (ref 8.4–10.5)
CO2: 25 mEq/L (ref 19–32)
CREATININE: 0.63 mg/dL (ref 0.50–1.10)
Chloride: 97 mEq/L (ref 96–112)
Glucose, Bld: 202 mg/dL — ABNORMAL HIGH (ref 70–99)
Potassium: 4.4 mEq/L (ref 3.7–5.3)
Sodium: 138 mEq/L (ref 137–147)

## 2014-10-29 LAB — CBC WITH DIFFERENTIAL/PLATELET
BASOS PCT: 1 % (ref 0–1)
Basophils Absolute: 0 10*3/uL (ref 0.0–0.1)
EOS PCT: 9 % — AB (ref 0–5)
Eosinophils Absolute: 0.7 10*3/uL (ref 0.0–0.7)
HEMATOCRIT: 34.9 % — AB (ref 36.0–46.0)
Hemoglobin: 11.7 g/dL — ABNORMAL LOW (ref 12.0–15.0)
Lymphocytes Relative: 38 % (ref 12–46)
Lymphs Abs: 2.8 10*3/uL (ref 0.7–4.0)
MCH: 28.7 pg (ref 26.0–34.0)
MCHC: 33.5 g/dL (ref 30.0–36.0)
MCV: 85.7 fL (ref 78.0–100.0)
MONO ABS: 0.4 10*3/uL (ref 0.1–1.0)
Monocytes Relative: 5 % (ref 3–12)
Neutro Abs: 3.6 10*3/uL (ref 1.7–7.7)
Neutrophils Relative %: 47 % (ref 43–77)
Platelets: 334 10*3/uL (ref 150–400)
RBC: 4.07 MIL/uL (ref 3.87–5.11)
RDW: 14.4 % (ref 11.5–15.5)
WBC: 7.4 10*3/uL (ref 4.0–10.5)

## 2014-10-29 LAB — TROPONIN I: Troponin I: <0.3 ng/mL (ref <0.30)

## 2014-10-29 LAB — CBG MONITORING, ED: GLUCOSE-CAPILLARY: 172 mg/dL — AB (ref 70–99)

## 2014-10-29 MED ORDER — OMEPRAZOLE 20 MG PO CPDR: 20.0000 mg | DELAYED_RELEASE_CAPSULE | Freq: Two times a day (BID) | ORAL | Status: DC

## 2014-10-29 MED ORDER — PANTOPRAZOLE SODIUM 40 MG IV SOLR
40.0000 mg | Freq: Once | INTRAVENOUS | Status: AC
  Administered 2014-10-29: 40 mg via INTRAVENOUS
  Filled 2014-10-29: qty 40

## 2014-10-29 MED ORDER — GI COCKTAIL ~~LOC~~
30.0000 mL | Freq: Once | ORAL | Status: AC
  Administered 2014-10-29: 30 mL via ORAL
  Filled 2014-10-29: qty 30

## 2014-10-29 NOTE — ED Provider Notes (Signed)
CSN: [id number]     Arrival date & time 10/29/14  1528 History   First MD Initiated Contact with Patient 10/29/14 1601     Chief Complaint  Patient presents with  . Hyperglycemia  . Chest Pain      HPI  Patient presents for evaluation of "uneasy feeling in my chest". She states it feels "like reflux" describes a pressure and a burning and a nausea feeling in her chest. She has been on an antibiotic for a dental infection for about a week. She's noticed symptoms for about 3-4 days. Does come intermittently. Seems to be associated somewhat with food. Also states her blood sugars and then 300 over many times in the last week since having the dental infection "looked at". Denies shortness of breath. Denies exertional pain. No dyspnea PND orthopnea. History of nonischemic myopathy with a normal cardiac angiogram in 2013. Compliant with medications. Type II diabetic on oral hypoglycemics, not insulin.  Past Medical History  Diagnosis Date  . CHF (congestive heart failure)   . Chest pain 11/2012  . Hypertension   . Hypothyroidism   . Depression   . Anxiety   . Shortness of breath   . Diabetes mellitus without complication   . Headache(784.0)   . Neuromuscular disorder     DIABETIC NEUROPATHY  . Arthritis    Past Surgical History  Procedure Laterality Date  . Abdominal hysterectomy     History reviewed. No pertinent family history. History  Substance Use Topics  . Smoking status: Never Smoker   . Smokeless tobacco: Never Used  . Alcohol Use: No   OB History    No data available     Review of Systems  Constitutional: Negative for fever, chills, diaphoresis, appetite change and fatigue.  HENT: Negative for mouth sores, sore throat and trouble swallowing.   Eyes: Negative for visual disturbance.  Respiratory: Negative for cough, chest tightness, shortness of breath and wheezing.   Cardiovascular: Positive for chest pain.  Gastrointestinal: Positive for nausea. Negative for  vomiting, abdominal pain, diarrhea and abdominal distention.  Endocrine: Negative for polydipsia, polyphagia and polyuria.  Genitourinary: Negative for dysuria, frequency and hematuria.  Musculoskeletal: Negative for gait problem.  Skin: Negative for color change, pallor and rash.  Neurological: Negative for dizziness, syncope, light-headedness and headaches.  Hematological: Does not bruise/bleed easily.  Psychiatric/Behavioral: Negative for behavioral problems and confusion.      Allergies  Codeine; Morphine and related; and Hydrocodone  Home Medications   Prior to Admission medications   Medication Sig Start Date End Date Taking? Authorizing Provider  acetaminophen-codeine (TYLENOL #3) 300-30 MG per tablet Take 1 tablet by mouth every 6 (six) hours as needed for moderate pain.   Yes Historical Provider, MD  ALPRAZolam Prudy Feeler) 0.5 MG tablet Take 0.5 mg by mouth at bedtime.   Yes Historical Provider, MD  aspirin EC 81 MG tablet Take 81 mg by mouth daily.   Yes Historical Provider, MD  carvedilol (COREG) 6.25 MG tablet Take 6.25 mg by mouth 2 (two) times daily with a meal.  12/04/12  Yes Amy D Clegg, NP  cephALEXin (KEFLEX) 500 MG capsule Take 500 mg by mouth 2 (two) times daily.   Yes Historical Provider, MD  chlorhexidine (PERIDEX) 0.12 % solution 15 mLs by Mouth Rinse route daily. 10/10/14  Yes Historical Provider, MD  DULoxetine (CYMBALTA) 60 MG capsule Take 60 mg by mouth 2 (two) times daily.   Yes Historical Provider, MD  furosemide (LASIX) 40 MG tablet  TAKE 1 TABLET ONCE DAILY. 08/26/14  Yes Antoine Poche, MD  glimepiride (AMARYL) 4 MG tablet Take 4 mg by mouth 2 (two) times daily.   Yes Historical Provider, MD  levothyroxine (SYNTHROID, LEVOTHROID) 75 MCG tablet Take 75 mcg by mouth daily before breakfast.    Yes Historical Provider, MD  lisinopril (PRINIVIL,ZESTRIL) 40 MG tablet Take 1 tablet (40 mg total) by mouth daily. 09/26/14  Yes Antoine Poche, MD  metFORMIN  (GLUCOPHAGE) 1000 MG tablet Take 1,000 mg by mouth 2 (two) times daily with a meal.   Yes Historical Provider, MD  atorvastatin (LIPITOR) 40 MG tablet Take 1 tablet (40 mg total) by mouth daily at 6 PM. Patient not taking: Reported on 10/29/2014 04/30/13   Renae Fickle, MD  lisinopril (PRINIVIL,ZESTRIL) 40 MG tablet TAKE 1 TABLET BY MOUTH ONCE DAILY. PATIENT NEEDS TO BE SEEN. Patient not taking: Reported on 10/29/2014 10/24/14   Antoine Poche, MD  omeprazole (PRILOSEC) 20 MG capsule Take 1 capsule (20 mg total) by mouth 2 (two) times daily. 10/29/14   Rolland Porter, MD   BP 143/79 mmHg  Pulse 77  Temp(Src) 99.1 F (37.3 C) (Oral)  Resp 18  Ht 5\' 5"  (1.651 m)  Wt 214 lb (97.07 kg)  BMI 35.61 kg/m2  SpO2 99% Physical Exam  Constitutional: She is oriented to person, place, and time. She appears well-developed and well-nourished. No distress.  HENT:  Head: Normocephalic.  Eyes: Conjunctivae are normal. Pupils are equal, round, and reactive to light. No scleral icterus.  Neck: Normal range of motion. Neck supple. No thyromegaly present.  Cardiovascular: Normal rate and regular rhythm.  Exam reveals no gallop and no friction rub.   No murmur heard. Pulmonary/Chest: Effort normal and breath sounds normal. No respiratory distress. She has no wheezes. She has no rales.  Lungs. Not tender in the chest.  Abdominal: Soft. Bowel sounds are normal. She exhibits no distension. There is no tenderness. There is no rebound.  Musculoskeletal: Normal range of motion.  Neurological: She is alert and oriented to person, place, and time.  Skin: Skin is warm and dry. No rash noted.  Psychiatric: She has a normal mood and affect. Her behavior is normal.    ED Course  Procedures (including critical care time) Labs Review Labs Reviewed  CBC WITH DIFFERENTIAL - Abnormal; Notable for the following:    Hemoglobin 11.7 (*)    HCT 34.9 (*)    Eosinophils Relative 9 (*)    All other components within  normal limits  BASIC METABOLIC PANEL - Abnormal; Notable for the following:    Glucose, Bld 202 (*)    Anion gap 16 (*)    All other components within normal limits  CBG MONITORING, ED - Abnormal; Notable for the following:    Glucose-Capillary 172 (*)    All other components within normal limits  TROPONIN I    Imaging Review Dg Chest Port 1 View  10/29/2014   CLINICAL DATA:  Left-sided chest pain radiating down the left arm and left posterior neck.  EXAM: PORTABLE CHEST - 1 VIEW  COMPARISON:  01/21/2010  FINDINGS: Mild elevation of the right hemidiaphragm is unchanged. No significant airspace disease or pulmonary edema. Heart size is normal. Trachea is midline.  IMPRESSION: No acute chest findings.   Electronically Signed   By: Richarda Overlie M.D.   On: 10/29/2014 17:25     EKG Interpretation   Date/Time:  Wednesday October 29 2014 16:11:26 EST Ventricular Rate:  76 PR Interval:  178 QRS Duration: 80 QT Interval:  406 QTC Calculation: 456 R Axis:   9 Text Interpretation:  Sinus rhythm Confirmed by Fayrene FearingJAMES  MD, Sofie Schendel ([id number]) on  10/29/2014 4:57:12 PM      Cath 2013: Left ventriculography: Left ventricular size is mildly increased. There is severe global hypokinesis with ejection fraction of 20-25%. There is minimal mitral insufficiency.  Final Conclusions:  1. Normal coronary anatomy.  2. Severe left ventricular dysfunction.  3. Mild to moderate pulmonary hypertension MDM   Final diagnoses:  Chest pain  Gastroesophageal reflux disease with esophagitis    EKG normal. Normal troponin after more than 12 hours of symptoms. Chest x-ray shows no abnormalities or signs of failure. No infiltrates. Stable hemoglobin. I think this is all extremity double to her antibiotic for her dental infection likely causing some exacerbation of reflux. Sugars likely high due to the infection. If her symptoms do not resolve if she finishes her antibiotics, and uses her proton pump inhibitor and  asked her to follow-up with her primary care physician. ER with acute changes or worsening.    Rolland PorterMark Jhayla Podgorski, MD 10/29/14 [phone]

## 2014-10-29 NOTE — Discharge Instructions (Signed)
Chest Pain (Nonspecific) °It is often hard to give a diagnosis for the cause of chest pain. There is always a chance that your pain could be related to something serious, such as a heart attack or a blood clot in the lungs. You need to follow up with your doctor. °HOME CARE °· If antibiotic medicine was given, take it as directed by your doctor. Finish the medicine even if you start to feel better. °· For the next few days, avoid activities that bring on chest pain. Continue physical activities as told by your doctor. °· Do not use any tobacco products. This includes cigarettes, chewing tobacco, and e-cigarettes. °· Avoid drinking alcohol. °· Only take medicine as told by your doctor. °· Follow your doctor's suggestions for more testing if your chest pain does not go away. °· Keep all doctor visits you made. °GET HELP IF: °· Your chest pain does not go away, even after treatment. °· You have a rash with blisters on your chest. °· You have a fever. °GET HELP RIGHT AWAY IF:  °· You have more pain or pain that spreads to your arm, neck, jaw, back, or belly (abdomen). °· You have shortness of breath. °· You cough more than usual or cough up blood. °· You have very bad back or belly pain. °· You feel sick to your stomach (nauseous) or throw up (vomit). °· You have very bad weakness. °· You pass out (faint). °· You have chills. °This is an emergency. Do not wait to see if the problems will go away. Call your local emergency services (911 in [place] Do not drive yourself to the hospital. °MAKE SURE YOU:  °· Understand these instructions. °· Will watch your condition. °· Will get help right away if you are not doing well or get worse. °Document Released: 05/09/2008 Document Revised: 11/26/2013 Document Reviewed: 05/09/2008 °ExitCare® Patient Information ©2015 [employer]. This information is not intended to replace advice given to you by your health care provider. Make sure you discuss any questions you have with your  health care provider. ° °Gastroesophageal Reflux Disease, Adult °Gastroesophageal reflux disease (GERD) happens when acid from your stomach goes into your food pipe (esophagus). The acid can cause a burning feeling in your chest. Over time, the acid can make small holes (ulcers) in your food pipe.  °HOME CARE °· Ask your doctor for advice about: °¨ Losing weight. °¨ Quitting smoking. °¨ Alcohol use. °· Avoid foods and drinks that make your problems worse. You may want to avoid: °¨ Caffeine and alcohol. °¨ Chocolate. °¨ Mints. °¨ Garlic and onions. °¨ Spicy foods. °¨ Citrus fruits, such as oranges, lemons, or limes. °¨ Foods that contain tomato, such as sauce, chili, salsa, and pizza. °¨ Fried and fatty foods. °· Avoid lying down for 3 hours before you go to bed or before you take a nap. °· Eat small meals often, instead of large meals. °· Wear loose-fitting clothing. Do not wear anything tight around your waist. °· Raise (elevate) the head of your bed 6 to 8 inches with wood blocks. Using extra pillows does not help. °· Only take medicines as told by your doctor. °· Do not take aspirin or ibuprofen. °GET HELP RIGHT AWAY IF:  °· You have pain in your arms, neck, jaw, teeth, or back. °· Your pain gets worse or changes. °· You feel sick to your stomach (nauseous), throw up (vomit), or sweat (diaphoresis). °· You feel short of breath, or you pass out (faint). °·   Your throw up is green, yellow, black, or looks like coffee grounds or blood. °· Your poop (stool) is red, bloody, or black. °MAKE SURE YOU:  °· Understand these instructions. °· Will watch your condition. °· Will get help right away if you are not doing well or get worse. °Document Released: 05/09/2008 Document Revised: 02/13/2012 Document Reviewed: 06/10/2011 °ExitCare® Patient Information ©2015 [employer]. This information is not intended to replace advice given to you by your health care provider. Make sure you discuss any questions you have with your  health care provider. ° °

## 2014-10-29 NOTE — ED Notes (Signed)
Pt alert & oriented x4, stable gait. Patient given discharge instructions, paperwork & prescription(s). Patient  instructed to stop at the registration desk to finish any additional paperwork. Patient verbalized understanding. Pt left department w/ no further questions. 

## 2014-10-29 NOTE — ED Notes (Signed)
MD at bedside. 

## 2014-10-29 NOTE — ED Notes (Addendum)
Pressure pain to right side of chest times 2 days.  States she is having a hard time keeping her sugar down for the past week.  cbg's have been running around 300.   Currently being treated for a dental abscess with antibiodics.

## 2014-11-13 ENCOUNTER — Encounter (HOSPITAL_COMMUNITY): Payer: Self-pay | Admitting: Cardiology

## 2014-12-02 ENCOUNTER — Encounter: Payer: Self-pay | Admitting: *Deleted

## 2015-01-20 ENCOUNTER — Other Ambulatory Visit: Payer: Self-pay | Admitting: Cardiology

## 2015-01-20 ENCOUNTER — Other Ambulatory Visit: Payer: Self-pay | Admitting: Internal Medicine

## 2015-01-21 ENCOUNTER — Other Ambulatory Visit: Payer: Self-pay | Admitting: Cardiology

## 2015-01-21 MED ORDER — FUROSEMIDE 40 MG PO TABS
40.0000 mg | ORAL_TABLET | Freq: Every day | ORAL | Status: DC
Start: 2015-01-21 — End: 2015-01-21

## 2015-01-21 NOTE — Telephone Encounter (Signed)
Received fax refill request  Rx # [id number] Medication:  Furosemide 40 mg tablet Qty 30 Sig:  Take one tablet once daily Physician:  Wyline Mood

## 2015-01-27 ENCOUNTER — Ambulatory Visit: Payer: Medicare Other | Admitting: Adult Health

## 2015-02-11 ENCOUNTER — Other Ambulatory Visit: Payer: Self-pay

## 2015-02-11 MED ORDER — LISINOPRIL 40 MG PO TABS: 40.0000 mg | ORAL_TABLET | Freq: Every day | ORAL | Status: DC

## 2015-02-11 NOTE — Telephone Encounter (Signed)
[employer] requested 90 day supply vs 30 day escribed to pharmacy

## 2015-02-17 ENCOUNTER — Other Ambulatory Visit: Payer: Self-pay | Admitting: Internal Medicine

## 2015-02-18 ENCOUNTER — Other Ambulatory Visit: Payer: Self-pay

## 2015-03-31 DIAGNOSIS — I10 Essential (primary) hypertension: Secondary | ICD-10-CM | POA: Diagnosis not present

## 2015-03-31 DIAGNOSIS — E1165 Type 2 diabetes mellitus with hyperglycemia: Secondary | ICD-10-CM | POA: Diagnosis not present

## 2015-03-31 DIAGNOSIS — E780 Pure hypercholesterolemia: Secondary | ICD-10-CM | POA: Diagnosis not present

## 2015-03-31 DIAGNOSIS — E039 Hypothyroidism, unspecified: Secondary | ICD-10-CM | POA: Diagnosis not present

## 2015-03-31 DIAGNOSIS — F329 Major depressive disorder, single episode, unspecified: Secondary | ICD-10-CM | POA: Diagnosis not present

## 2015-04-20 ENCOUNTER — Other Ambulatory Visit: Payer: Self-pay | Admitting: Internal Medicine

## 2015-04-23 ENCOUNTER — Telehealth (HOSPITAL_COMMUNITY): Payer: Self-pay | Admitting: *Deleted

## 2015-05-15 ENCOUNTER — Other Ambulatory Visit: Payer: Self-pay | Admitting: Cardiology

## 2015-08-22 ENCOUNTER — Other Ambulatory Visit: Payer: Self-pay | Admitting: Cardiology

## 2015-08-24 ENCOUNTER — Other Ambulatory Visit: Payer: Self-pay

## 2015-08-24 MED ORDER — FUROSEMIDE 40 MG PO TABS: 40.0000 mg | ORAL_TABLET | Freq: Every day | ORAL | Status: DC

## 2015-08-25 DIAGNOSIS — E119 Type 2 diabetes mellitus without complications: Secondary | ICD-10-CM | POA: Diagnosis not present

## 2015-08-25 DIAGNOSIS — E039 Hypothyroidism, unspecified: Secondary | ICD-10-CM | POA: Diagnosis not present

## 2015-08-25 DIAGNOSIS — I10 Essential (primary) hypertension: Secondary | ICD-10-CM | POA: Diagnosis not present

## 2015-08-25 DIAGNOSIS — Z23 Encounter for immunization: Secondary | ICD-10-CM | POA: Diagnosis not present

## 2015-09-30 NOTE — Telephone Encounter (Signed)
Error

## 2015-11-09 ENCOUNTER — Encounter (HOSPITAL_COMMUNITY): Payer: Self-pay | Admitting: Emergency Medicine

## 2015-11-09 ENCOUNTER — Emergency Department (HOSPITAL_COMMUNITY)
Admission: EM | Admit: 2015-11-09 | Discharge: 2015-11-09 | Disposition: A | Discharge status: Home / Self Care | Payer: Medicare Other | Attending: Emergency Medicine | Admitting: Emergency Medicine

## 2015-11-09 ENCOUNTER — Emergency Department (HOSPITAL_COMMUNITY): Payer: Medicare Other

## 2015-11-09 DIAGNOSIS — G8929 Other chronic pain: Secondary | ICD-10-CM | POA: Insufficient documentation

## 2015-11-09 DIAGNOSIS — R0781 Pleurodynia: Secondary | ICD-10-CM | POA: Diagnosis not present

## 2015-11-09 DIAGNOSIS — F329 Major depressive disorder, single episode, unspecified: Secondary | ICD-10-CM | POA: Insufficient documentation

## 2015-11-09 DIAGNOSIS — Z7984 Long term (current) use of oral hypoglycemic drugs: Secondary | ICD-10-CM | POA: Insufficient documentation

## 2015-11-09 DIAGNOSIS — J069 Acute upper respiratory infection, unspecified: Secondary | ICD-10-CM | POA: Diagnosis not present

## 2015-11-09 DIAGNOSIS — F419 Anxiety disorder, unspecified: Secondary | ICD-10-CM | POA: Diagnosis not present

## 2015-11-09 DIAGNOSIS — I10 Essential (primary) hypertension: Secondary | ICD-10-CM | POA: Diagnosis not present

## 2015-11-09 DIAGNOSIS — Z794 Long term (current) use of insulin: Secondary | ICD-10-CM | POA: Diagnosis not present

## 2015-11-09 DIAGNOSIS — Z7982 Long term (current) use of aspirin: Secondary | ICD-10-CM | POA: Insufficient documentation

## 2015-11-09 DIAGNOSIS — E119 Type 2 diabetes mellitus without complications: Secondary | ICD-10-CM | POA: Insufficient documentation

## 2015-11-09 DIAGNOSIS — Z9889 Other specified postprocedural states: Secondary | ICD-10-CM | POA: Diagnosis not present

## 2015-11-09 DIAGNOSIS — Z79899 Other long term (current) drug therapy: Secondary | ICD-10-CM | POA: Diagnosis not present

## 2015-11-09 DIAGNOSIS — R350 Frequency of micturition: Secondary | ICD-10-CM | POA: Insufficient documentation

## 2015-11-09 DIAGNOSIS — I509 Heart failure, unspecified: Secondary | ICD-10-CM | POA: Insufficient documentation

## 2015-11-09 DIAGNOSIS — M549 Dorsalgia, unspecified: Secondary | ICD-10-CM

## 2015-11-09 DIAGNOSIS — M1990 Unspecified osteoarthritis, unspecified site: Secondary | ICD-10-CM | POA: Diagnosis not present

## 2015-11-09 DIAGNOSIS — E039 Hypothyroidism, unspecified: Secondary | ICD-10-CM | POA: Insufficient documentation

## 2015-11-09 DIAGNOSIS — M791 Myalgia: Secondary | ICD-10-CM | POA: Insufficient documentation

## 2015-11-09 DIAGNOSIS — R05 Cough: Secondary | ICD-10-CM | POA: Diagnosis not present

## 2015-11-09 DIAGNOSIS — M545 Low back pain: Secondary | ICD-10-CM | POA: Insufficient documentation

## 2015-11-09 LAB — COMPREHENSIVE METABOLIC PANEL
ALBUMIN: 4.1 g/dL (ref 3.5–5.0)
ALT: 38 U/L (ref 14–54)
AST: 38 U/L (ref 15–41)
Alkaline Phosphatase: 62 U/L (ref 38–126)
Anion gap: 7 (ref 5–15)
BUN: 13 mg/dL (ref 6–20)
CHLORIDE: 102 mmol/L (ref 101–111)
CO2: 27 mmol/L (ref 22–32)
Calcium: 8.9 mg/dL (ref 8.9–10.3)
Creatinine, Ser: 0.76 mg/dL (ref 0.44–1.00)
GFR calc Af Amer: >60 mL/min (ref >60)
GFR calc non Af Amer: >60 mL/min (ref >60)
GLUCOSE: 246 mg/dL — AB (ref 65–99)
POTASSIUM: 4.6 mmol/L (ref 3.5–5.1)
SODIUM: 136 mmol/L (ref 135–145)
Total Bilirubin: 0.6 mg/dL (ref 0.3–1.2)
Total Protein: 7.7 g/dL (ref 6.5–8.1)

## 2015-11-09 LAB — CBC WITH DIFFERENTIAL/PLATELET
BASOS ABS: 0 10*3/uL (ref 0.0–0.1)
BASOS PCT: 0 %
EOS ABS: 0.6 10*3/uL (ref 0.0–0.7)
EOS PCT: 7 %
HCT: 32.9 % — ABNORMAL LOW (ref 36.0–46.0)
Hemoglobin: 11.2 g/dL — ABNORMAL LOW (ref 12.0–15.0)
Lymphocytes Relative: 29 %
Lymphs Abs: 2.5 10*3/uL (ref 0.7–4.0)
MCH: 34 pg (ref 26.0–34.0)
MCHC: 34 g/dL (ref 30.0–36.0)
MCV: 100 fL (ref 78.0–100.0)
MONO ABS: 0.3 10*3/uL (ref 0.1–1.0)
Monocytes Relative: 3 %
Neutro Abs: 5.2 10*3/uL (ref 1.7–7.7)
Neutrophils Relative %: 61 %
Platelets: 305 10*3/uL (ref 150–400)
RBC: 3.29 MIL/uL — ABNORMAL LOW (ref 3.87–5.11)
RDW: 15 % (ref 11.5–15.5)
WBC: 8.6 10*3/uL (ref 4.0–10.5)

## 2015-11-09 LAB — URINALYSIS, ROUTINE W REFLEX MICROSCOPIC
BILIRUBIN URINE: NEGATIVE
GLUCOSE, UA: 500 mg/dL — AB
HGB URINE DIPSTICK: NEGATIVE
KETONES UR: 15 mg/dL — AB
Leukocytes, UA: NEGATIVE
Nitrite: NEGATIVE
PROTEIN: NEGATIVE mg/dL
Specific Gravity, Urine: >1.03 — ABNORMAL HIGH (ref 1.005–1.030)
pH: 5.5 (ref 5.0–8.0)

## 2015-11-09 LAB — CBG MONITORING, ED: GLUCOSE-CAPILLARY: 234 mg/dL — AB (ref 65–99)

## 2015-11-09 MED ORDER — BENZONATATE 100 MG PO CAPS
100.0000 mg | ORAL_CAPSULE | Freq: Three times a day (TID) | ORAL | Status: DC
Start: 2015-11-09 — End: 2016-05-19

## 2015-11-09 MED ORDER — IBUPROFEN 400 MG PO TABS
ORAL_TABLET | ORAL | Status: AC
  Filled 2015-11-09: qty 1

## 2015-11-09 MED ORDER — DICLOFENAC SODIUM 50 MG PO TBEC: 50.0000 mg | DELAYED_RELEASE_TABLET | Freq: Two times a day (BID) | ORAL | Status: DC

## 2015-11-09 MED ORDER — IBUPROFEN 400 MG PO TABS
600.0000 mg | ORAL_TABLET | Freq: Once | ORAL | Status: AC
  Administered 2015-11-09: 600 mg via ORAL
  Filled 2015-11-09: qty 2

## 2015-11-09 NOTE — ED Notes (Signed)
NP at bedside.
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
## 2015-11-09 NOTE — ED Notes (Signed)
Pt states that she started with a dry cough 2 weeks ago , has developed discomfort bilaterally under rib cage and is concerned that she might have pneumonia

## 2015-12-07 ENCOUNTER — Other Ambulatory Visit: Payer: Self-pay | Admitting: Cardiology

## 2015-12-07 DIAGNOSIS — E039 Hypothyroidism, unspecified: Secondary | ICD-10-CM | POA: Diagnosis not present

## 2015-12-07 DIAGNOSIS — I10 Essential (primary) hypertension: Secondary | ICD-10-CM | POA: Diagnosis not present

## 2015-12-07 DIAGNOSIS — E119 Type 2 diabetes mellitus without complications: Secondary | ICD-10-CM | POA: Diagnosis not present

## 2015-12-07 DIAGNOSIS — H6501 Acute serous otitis media, right ear: Secondary | ICD-10-CM | POA: Diagnosis not present

## 2015-12-07 DIAGNOSIS — E785 Hyperlipidemia, unspecified: Secondary | ICD-10-CM | POA: Diagnosis not present

## 2015-12-07 DIAGNOSIS — F339 Major depressive disorder, recurrent, unspecified: Secondary | ICD-10-CM | POA: Diagnosis not present

## 2015-12-21 DIAGNOSIS — E1141 Type 2 diabetes mellitus with diabetic mononeuropathy: Secondary | ICD-10-CM | POA: Diagnosis not present

## 2015-12-21 DIAGNOSIS — I10 Essential (primary) hypertension: Secondary | ICD-10-CM | POA: Diagnosis not present

## 2015-12-21 DIAGNOSIS — E039 Hypothyroidism, unspecified: Secondary | ICD-10-CM | POA: Diagnosis not present

## 2015-12-21 DIAGNOSIS — G589 Mononeuropathy, unspecified: Secondary | ICD-10-CM | POA: Diagnosis not present

## 2015-12-21 DIAGNOSIS — F339 Major depressive disorder, recurrent, unspecified: Secondary | ICD-10-CM | POA: Diagnosis not present

## 2016-01-08 DIAGNOSIS — R300 Dysuria: Secondary | ICD-10-CM | POA: Diagnosis not present

## 2016-01-08 DIAGNOSIS — Z7982 Long term (current) use of aspirin: Secondary | ICD-10-CM | POA: Diagnosis not present

## 2016-01-08 DIAGNOSIS — Z79899 Other long term (current) drug therapy: Secondary | ICD-10-CM | POA: Diagnosis not present

## 2016-01-08 DIAGNOSIS — F329 Major depressive disorder, single episode, unspecified: Secondary | ICD-10-CM | POA: Diagnosis not present

## 2016-01-08 DIAGNOSIS — E119 Type 2 diabetes mellitus without complications: Secondary | ICD-10-CM | POA: Diagnosis not present

## 2016-01-08 DIAGNOSIS — I10 Essential (primary) hypertension: Secondary | ICD-10-CM | POA: Diagnosis not present

## 2016-01-08 DIAGNOSIS — Z87820 Personal history of traumatic brain injury: Secondary | ICD-10-CM | POA: Diagnosis not present

## 2016-01-08 DIAGNOSIS — N390 Urinary tract infection, site not specified: Secondary | ICD-10-CM | POA: Diagnosis not present

## 2016-01-08 DIAGNOSIS — R319 Hematuria, unspecified: Secondary | ICD-10-CM | POA: Diagnosis not present

## 2016-02-08 DIAGNOSIS — R5383 Other fatigue: Secondary | ICD-10-CM | POA: Diagnosis not present

## 2016-02-08 DIAGNOSIS — R5381 Other malaise: Secondary | ICD-10-CM | POA: Diagnosis not present

## 2016-02-08 DIAGNOSIS — R1084 Generalized abdominal pain: Secondary | ICD-10-CM | POA: Diagnosis not present

## 2016-02-08 DIAGNOSIS — M791 Myalgia: Secondary | ICD-10-CM | POA: Diagnosis not present

## 2016-02-08 DIAGNOSIS — R350 Frequency of micturition: Secondary | ICD-10-CM | POA: Diagnosis not present

## 2016-04-01 DIAGNOSIS — Z79899 Other long term (current) drug therapy: Secondary | ICD-10-CM | POA: Diagnosis not present

## 2016-04-01 DIAGNOSIS — R109 Unspecified abdominal pain: Secondary | ICD-10-CM | POA: Diagnosis not present

## 2016-04-01 DIAGNOSIS — D649 Anemia, unspecified: Secondary | ICD-10-CM | POA: Diagnosis not present

## 2016-04-01 DIAGNOSIS — E1165 Type 2 diabetes mellitus with hyperglycemia: Secondary | ICD-10-CM | POA: Diagnosis not present

## 2016-04-01 DIAGNOSIS — Z7984 Long term (current) use of oral hypoglycemic drugs: Secondary | ICD-10-CM | POA: Diagnosis not present

## 2016-04-01 DIAGNOSIS — G5603 Carpal tunnel syndrome, bilateral upper limbs: Secondary | ICD-10-CM | POA: Diagnosis not present

## 2016-04-01 DIAGNOSIS — R200 Anesthesia of skin: Secondary | ICD-10-CM | POA: Diagnosis not present

## 2016-04-01 DIAGNOSIS — K219 Gastro-esophageal reflux disease without esophagitis: Secondary | ICD-10-CM | POA: Diagnosis not present

## 2016-04-01 DIAGNOSIS — I10 Essential (primary) hypertension: Secondary | ICD-10-CM | POA: Diagnosis not present

## 2016-04-01 DIAGNOSIS — Z7982 Long term (current) use of aspirin: Secondary | ICD-10-CM | POA: Diagnosis not present

## 2016-04-01 DIAGNOSIS — R079 Chest pain, unspecified: Secondary | ICD-10-CM | POA: Diagnosis not present

## 2016-04-06 DIAGNOSIS — E113292 Type 2 diabetes mellitus with mild nonproliferative diabetic retinopathy without macular edema, left eye: Secondary | ICD-10-CM | POA: Diagnosis not present

## 2016-04-06 DIAGNOSIS — H25043 Posterior subcapsular polar age-related cataract, bilateral: Secondary | ICD-10-CM | POA: Diagnosis not present

## 2016-04-06 DIAGNOSIS — H25013 Cortical age-related cataract, bilateral: Secondary | ICD-10-CM | POA: Diagnosis not present

## 2016-04-06 DIAGNOSIS — H16143 Punctate keratitis, bilateral: Secondary | ICD-10-CM | POA: Diagnosis not present

## 2016-04-13 DIAGNOSIS — H25813 Combined forms of age-related cataract, bilateral: Secondary | ICD-10-CM | POA: Diagnosis not present

## 2016-04-13 DIAGNOSIS — E119 Type 2 diabetes mellitus without complications: Secondary | ICD-10-CM | POA: Diagnosis not present

## 2016-04-25 DIAGNOSIS — H2511 Age-related nuclear cataract, right eye: Secondary | ICD-10-CM | POA: Diagnosis not present

## 2016-04-25 DIAGNOSIS — H25811 Combined forms of age-related cataract, right eye: Secondary | ICD-10-CM | POA: Diagnosis not present

## 2016-05-10 ENCOUNTER — Encounter (HOSPITAL_COMMUNITY): Payer: Self-pay

## 2016-05-10 ENCOUNTER — Emergency Department (HOSPITAL_COMMUNITY)
Admission: EM | Admit: 2016-05-10 | Discharge: 2016-05-10 | Disposition: A | Discharge status: Home / Self Care | Payer: Medicare Other | Attending: Emergency Medicine | Admitting: Emergency Medicine

## 2016-05-10 DIAGNOSIS — R109 Unspecified abdominal pain: Secondary | ICD-10-CM | POA: Diagnosis not present

## 2016-05-10 DIAGNOSIS — I509 Heart failure, unspecified: Secondary | ICD-10-CM | POA: Insufficient documentation

## 2016-05-10 DIAGNOSIS — E119 Type 2 diabetes mellitus without complications: Secondary | ICD-10-CM | POA: Diagnosis not present

## 2016-05-10 DIAGNOSIS — I10 Essential (primary) hypertension: Secondary | ICD-10-CM | POA: Diagnosis not present

## 2016-05-10 DIAGNOSIS — R197 Diarrhea, unspecified: Secondary | ICD-10-CM | POA: Diagnosis not present

## 2016-05-10 DIAGNOSIS — R110 Nausea: Secondary | ICD-10-CM | POA: Insufficient documentation

## 2016-05-10 LAB — COMPREHENSIVE METABOLIC PANEL
ALK PHOS: 60 U/L (ref 38–126)
ALT: 46 U/L (ref 14–54)
AST: 45 U/L — ABNORMAL HIGH (ref 15–41)
Albumin: 4.2 g/dL (ref 3.5–5.0)
Anion gap: 10 (ref 5–15)
BILIRUBIN TOTAL: 1.1 mg/dL (ref 0.3–1.2)
BUN: 16 mg/dL (ref 6–20)
CALCIUM: 9.6 mg/dL (ref 8.9–10.3)
CO2: 26 mmol/L (ref 22–32)
CREATININE: 0.73 mg/dL (ref 0.44–1.00)
Chloride: 103 mmol/L (ref 101–111)
GFR calc Af Amer: >60 mL/min (ref >60)
Glucose, Bld: 183 mg/dL — ABNORMAL HIGH (ref 65–99)
Potassium: 4.2 mmol/L (ref 3.5–5.1)
SODIUM: 139 mmol/L (ref 135–145)
TOTAL PROTEIN: 7.5 g/dL (ref 6.5–8.1)

## 2016-05-10 LAB — CBG MONITORING, ED: Glucose-Capillary: 176 mg/dL — ABNORMAL HIGH (ref 65–99)

## 2016-05-10 LAB — URINALYSIS, ROUTINE W REFLEX MICROSCOPIC
GLUCOSE, UA: NEGATIVE mg/dL
Hgb urine dipstick: NEGATIVE
KETONES UR: 15 mg/dL — AB
LEUKOCYTES UA: NEGATIVE
NITRITE: NEGATIVE
PROTEIN: NEGATIVE mg/dL
Specific Gravity, Urine: 1.026 (ref 1.005–1.030)
pH: 5 (ref 5.0–8.0)

## 2016-05-10 LAB — LIPASE, BLOOD: LIPASE: 22 U/L (ref 11–51)

## 2016-05-10 LAB — CBC
HCT: 28.2 % — ABNORMAL LOW (ref 36.0–46.0)
Hemoglobin: 9.4 g/dL — ABNORMAL LOW (ref 12.0–15.0)
MCH: 34.6 pg — ABNORMAL HIGH (ref 26.0–34.0)
MCHC: 33.3 g/dL (ref 30.0–36.0)
MCV: 103.7 fL — AB (ref 78.0–100.0)
PLATELETS: 215 10*3/uL (ref 150–400)
RBC: 2.72 MIL/uL — ABNORMAL LOW (ref 3.87–5.11)
RDW: 16 % — AB (ref 11.5–15.5)
WBC: 6.1 10*3/uL (ref 4.0–10.5)

## 2016-05-10 NOTE — ED Notes (Signed)
CBG: 176 °

## 2016-05-10 NOTE — ED Notes (Signed)
Patient complains of intermittent abdominal pain and cramping x 2 weeks. States pain after eating with diarrhea and nausea. No distress on arrival

## 2016-05-10 NOTE — ED Notes (Signed)
Pt's name called to update vitals no answer 

## 2016-05-19 ENCOUNTER — Encounter (HOSPITAL_COMMUNITY): Payer: Self-pay | Admitting: Emergency Medicine

## 2016-05-19 ENCOUNTER — Other Ambulatory Visit: Payer: Self-pay

## 2016-05-19 ENCOUNTER — Emergency Department (HOSPITAL_COMMUNITY): Payer: Medicare Other

## 2016-05-19 ENCOUNTER — Emergency Department (HOSPITAL_COMMUNITY)
Admission: EM | Admit: 2016-05-19 | Discharge: 2016-05-20 | Disposition: A | Discharge status: Home / Self Care | Payer: Medicare Other | Attending: Emergency Medicine | Admitting: Emergency Medicine

## 2016-05-19 DIAGNOSIS — I110 Hypertensive heart disease with heart failure: Secondary | ICD-10-CM | POA: Diagnosis not present

## 2016-05-19 DIAGNOSIS — Z79899 Other long term (current) drug therapy: Secondary | ICD-10-CM | POA: Insufficient documentation

## 2016-05-19 DIAGNOSIS — R079 Chest pain, unspecified: Secondary | ICD-10-CM

## 2016-05-19 DIAGNOSIS — R1030 Lower abdominal pain, unspecified: Secondary | ICD-10-CM | POA: Diagnosis not present

## 2016-05-19 DIAGNOSIS — Z8719 Personal history of other diseases of the digestive system: Secondary | ICD-10-CM

## 2016-05-19 DIAGNOSIS — R1084 Generalized abdominal pain: Secondary | ICD-10-CM | POA: Diagnosis not present

## 2016-05-19 DIAGNOSIS — E119 Type 2 diabetes mellitus without complications: Secondary | ICD-10-CM | POA: Diagnosis not present

## 2016-05-19 DIAGNOSIS — M1990 Unspecified osteoarthritis, unspecified site: Secondary | ICD-10-CM | POA: Insufficient documentation

## 2016-05-19 DIAGNOSIS — E039 Hypothyroidism, unspecified: Secondary | ICD-10-CM | POA: Insufficient documentation

## 2016-05-19 DIAGNOSIS — Z7982 Long term (current) use of aspirin: Secondary | ICD-10-CM | POA: Insufficient documentation

## 2016-05-19 DIAGNOSIS — R0789 Other chest pain: Secondary | ICD-10-CM | POA: Insufficient documentation

## 2016-05-19 DIAGNOSIS — F329 Major depressive disorder, single episode, unspecified: Secondary | ICD-10-CM | POA: Insufficient documentation

## 2016-05-19 DIAGNOSIS — Z7984 Long term (current) use of oral hypoglycemic drugs: Secondary | ICD-10-CM | POA: Diagnosis not present

## 2016-05-19 DIAGNOSIS — I509 Heart failure, unspecified: Secondary | ICD-10-CM | POA: Insufficient documentation

## 2016-05-19 LAB — COMPREHENSIVE METABOLIC PANEL
ALBUMIN: 4.4 g/dL (ref 3.5–5.0)
ALK PHOS: 62 U/L (ref 38–126)
ALT: 39 U/L (ref 14–54)
ANION GAP: 9 (ref 5–15)
AST: 32 U/L (ref 15–41)
BILIRUBIN TOTAL: 0.9 mg/dL (ref 0.3–1.2)
BUN: 19 mg/dL (ref 6–20)
CO2: 24 mmol/L (ref 22–32)
Calcium: 8.9 mg/dL (ref 8.9–10.3)
Chloride: 101 mmol/L (ref 101–111)
Creatinine, Ser: 0.78 mg/dL (ref 0.44–1.00)
GFR calc Af Amer: >60 mL/min (ref >60)
GFR calc non Af Amer: >60 mL/min (ref >60)
GLUCOSE: 241 mg/dL — AB (ref 65–99)
Potassium: 4.1 mmol/L (ref 3.5–5.1)
Sodium: 134 mmol/L — ABNORMAL LOW (ref 135–145)
Total Protein: 7.4 g/dL (ref 6.5–8.1)

## 2016-05-19 LAB — PROTIME-INR
INR: 1 (ref 0.00–1.49)
Prothrombin Time: 13.4 seconds (ref 11.6–15.2)

## 2016-05-19 LAB — URINALYSIS, ROUTINE W REFLEX MICROSCOPIC
Bilirubin Urine: NEGATIVE
Glucose, UA: NEGATIVE mg/dL
HGB URINE DIPSTICK: NEGATIVE
Leukocytes, UA: NEGATIVE
Nitrite: NEGATIVE
PH: 5.5 (ref 5.0–8.0)
Protein, ur: NEGATIVE mg/dL

## 2016-05-19 LAB — CBC
HEMATOCRIT: 26.7 % — AB (ref 36.0–46.0)
HEMOGLOBIN: 9.4 g/dL — AB (ref 12.0–15.0)
MCH: 37.2 pg — AB (ref 26.0–34.0)
MCHC: 35.2 g/dL (ref 30.0–36.0)
MCV: 105.5 fL — AB (ref 78.0–100.0)
Platelets: 211 10*3/uL (ref 150–400)
RBC: 2.53 MIL/uL — AB (ref 3.87–5.11)
RDW: 17.1 % — ABNORMAL HIGH (ref 11.5–15.5)
WBC: 5.9 10*3/uL (ref 4.0–10.5)

## 2016-05-19 LAB — LIPASE, BLOOD: Lipase: 24 U/L (ref 11–51)

## 2016-05-19 LAB — APTT: APTT: 26 s (ref 24–37)

## 2016-05-19 LAB — POC OCCULT BLOOD, ED: FECAL OCCULT BLD: NEGATIVE

## 2016-05-19 MED ORDER — SODIUM CHLORIDE 0.9 % IV BOLUS (SEPSIS)
1000.0000 mL | Freq: Once | INTRAVENOUS | Status: AC
  Administered 2016-05-19: 1000 mL via INTRAVENOUS

## 2016-05-19 MED ORDER — KETOROLAC TROMETHAMINE 30 MG/ML IJ SOLN
30.0000 mg | Freq: Once | INTRAMUSCULAR | Status: AC
  Administered 2016-05-19: 30 mg via INTRAVENOUS
  Filled 2016-05-19: qty 1

## 2016-05-19 MED ORDER — DIATRIZOATE MEGLUMINE & SODIUM 66-10 % PO SOLN
ORAL | Status: AC
  Administered 2016-05-20: 30 mL
  Filled 2016-05-19: qty 30

## 2016-05-19 MED ORDER — IOPAMIDOL (ISOVUE-300) INJECTION 61%
100.0000 mL | Freq: Once | INTRAVENOUS | Status: AC | PRN
  Administered 2016-05-20: 100 mL via INTRAVENOUS

## 2016-05-19 NOTE — ED Provider Notes (Signed)
TIME SEEN: 11:30 PM  CHIEF COMPLAINT: Rash on my tongue, chest pain, abdominal pain, black stools, my hands are "drawing up"  HPI: Pt is a 55 y.o. female with history of hypertension, diabetes, hypothyroidism who presents to the emergency department with multiple different complaints. She reports she has had diffuse left-sided abdominal pain that she describes as a "raw" feeling. Pain radiates up into her chest as well. She states that the symptoms have been present for 2 weeks. No aggravating or relieving factors. She denies any fevers but states she has had intermittent chills. Has had nausea but no vomiting or diarrhea. Denies dysuria, hematuria, urinary frequency, vaginal bleeding or discharge. She is status post hysterectomy. States the past 2 days she has noticed black stools but has been taking Pepto-Bismol. Not on anticoagulation or antiplatelet agents. Not on iron supplements. Denies sick contacts or recent travel. Denies any shortness of breath, diaphoresis or dizziness. Also complains that she has noticed that recently her hands "drawing up". She also notices that she has a rash on her tongue for the past several days.  ROS: See HPI Constitutional: no fever  Eyes: no drainage  ENT: no runny nose   Cardiovascular:   chest pain  Resp: no SOB  GI: no vomiting GU: no dysuria Integumentary: no rash  Allergy: no hives  Musculoskeletal: no leg swelling  Neurological: no slurred speech ROS otherwise negative  PAST MEDICAL HISTORY/PAST SURGICAL HISTORY:  Past Medical History  Diagnosis Date  . CHF (congestive heart failure) (HCC)   . Chest pain 11/2012  . Hypertension   . Hypothyroidism   . Depression   . Anxiety   . Shortness of breath   . Diabetes mellitus without complication (HCC)   . Headache(784.0)   . Neuromuscular disorder (HCC)     DIABETIC NEUROPATHY  . Arthritis     MEDICATIONS:  Prior to Admission medications   Medication Sig Start Date End Date Taking?  Authorizing Provider  aspirin EC 325 MG tablet Take 325 mg by mouth daily.   Yes Historical Provider, MD  carvedilol (COREG) 12.5 MG tablet Take 12.5 mg by mouth 2 (two) times daily with a meal.   Yes Historical Provider, MD  furosemide (LASIX) 40 MG tablet Take 1 tablet (40 mg total) by mouth daily. Patient taking differently: Take 40 mg by mouth daily as needed for fluid.  08/24/15  Yes Antoine Poche, MD  glimepiride (AMARYL) 4 MG tablet Take 4 mg by mouth 2 (two) times daily.   Yes Historical Provider, MD  Insulin Detemir (LEVEMIR FLEXTOUCH) 100 UNIT/ML Pen Inject 30 Units into the skin at bedtime.    Yes Historical Provider, MD  levothyroxine (SYNTHROID, LEVOTHROID) 100 MCG tablet Take 100 mcg by mouth daily before breakfast.   Yes Historical Provider, MD  lisinopril (PRINIVIL,ZESTRIL) 40 MG tablet TAKE 1 TABLET BY MOUTH ONCE DAILY. PATIENT NEEDS TO BE SEEN. Patient taking differently: TAKE 1 TABLET BY MOUTH ONCE DAILY 10/24/14  Yes Antoine Poche, MD  metFORMIN (GLUCOPHAGE) 1000 MG tablet Take 1,000 mg by mouth 2 (two) times daily with a meal.   Yes Historical Provider, MD  Omega-3 Fatty Acids (FISH OIL) 1000 MG CAPS Take 1 capsule by mouth every morning.    Yes Historical Provider, MD  prednisoLONE acetate (PRED FORTE) 1 % ophthalmic suspension As directed pre-post cataract surgery 05/12/16   Historical Provider, MD  PROLENSA 0.07 % SOLN As directed pre-post cataract surgery 05/12/16   Historical Provider, MD  trimethoprim-polymyxin b (  POLYTRIM) ophthalmic solution As directed pre-post cataract surgery 05/12/16   Historical Provider, MD    ALLERGIES:  Allergies  Allergen Reactions  . Codeine Itching  . Morphine And Related Itching  . Hydrocodone Itching and Rash    SOCIAL HISTORY:  Social History  Substance Use Topics  . Smoking status: Never Smoker   . Smokeless tobacco: Never Used  . Alcohol Use: No    FAMILY HISTORY: History reviewed. No pertinent family  history.  EXAM: BP 163/85 mmHg  Pulse 99  Temp(Src) 98.7 F (37.1 C) (Oral)  Resp 20  Ht  (1.651 m)  Wt 204 lb (92.534 kg)  BMI 33.95 kg/m2  SpO2 99% CONSTITUTIONAL: Alert and oriented and responds appropriately to questions. Well-appearing; well-nourished HEAD: Normocephalic EYES: Conjunctivae clear, PERRL ENT: normal nose; no rhinorrhea; moist mucous membranes; No pharyngeal erythema or petechiae, no tonsillar hypertrophy or exudate, no uvular deviation, no trismus or drooling, normal phonation, no stridor, no dental caries or abscess noted, no Ludwig's angina, tongue sits flat in the bottom of the mouth; multiple aphthous ulcers noted on patient's tongue, no thrush, no other lesions noted to the tongue NECK: Supple, no meningismus, no LAD  CARD: RRR; S1 and S2 appreciated; no murmurs, no clicks, no rubs, no gallops RESP: Normal chest excursion without splinting or tachypnea; breath sounds clear and equal bilaterally; no wheezes, no rhonchi, no rales, no hypoxia or respiratory distress, speaking full sentences ABD/GI: Normal bowel sounds; non-distended; soft, tender throughout the left abdomen, no rebound, no guarding, no peritoneal signs RECTAL:  Normal rectal tone, no gross blood or melena, guaiac negative, nonbleeding and nonthrombosed external hemorrhoids appreciated, nontender rectal exam BACK:  The back appears normal and is non-tender to palpation, there is no CVA tenderness EXT: Normal ROM in all joints; non-tender to palpation; no edema; normal capillary refill; no cyanosis, no calf tenderness or swelling    SKIN: Normal color for age and race; warm; no rash NEURO: Moves all extremities equally, sensation to light touch intact diffusely, cranial nerves II through XII intact, normal gait PSYCH: The patient's mood and manner are appropriate. Grooming and personal hygiene are appropriate.  MEDICAL DECISION MAKING: Patient here with multiple different complaints. Labs ordered  in triage show mild anemia which was there last week. Hemoglobin is 9.4 and stable. This is down slightly from a her previous and December. She is Hemoccult negative. I suspect her black stools are from Pepto-Bismol. She is mildly tender throughout the left abdomen. She is requesting imaging. We'll proceed with CT scan. We'll also obtain troponin, EKG and chest x-ray because of her atypical chest pain. Describes the pain in her chest is constant for 2 weeks. It is not exertional or pleuritic.  ED PROGRESS: Patient's CT of her abdomen and pelvis, chest x-ray are normal. Troponin negative. Electrolytes are normal including potassium, calcium. I feel she is safe to be discharged. I do not feel she needs further emergent workup. She does have PCP follow-up scheduled next week. Discussed these findings with patient. She reports feeling better after Toradol and IV fluids. We'll discharge with ibuprofen, Zofran. Discussed return precautions. She is comfortable with this plan.    At this time, I do not feel there is any life-threatening condition present. I have reviewed and discussed all results (EKG, imaging, lab, urine as appropriate), exam findings with patient. I have reviewed nursing notes and appropriate previous records.  I feel the patient is safe to be discharged home without further emergent workup. Discussed  usual and customary return precautions. Patient and family (if present) verbalize understanding and are comfortable with this plan.  Patient will follow-up with their primary care provider. If they do not have a primary care provider, information for follow-up has been provided to them. All questions have been answered.     Date: 05/19/2016 23:40   Rate: 84  Rhythm: normal sinus rhythm  QRS Axis: normal  Intervals: normal  ST/T Wave abnormalities: normal  Conduction Disutrbances: none  Narrative Interpretation: unremarkable      Layla Maw Ebert Forrester, DO 05/20/16 0134

## 2016-05-19 NOTE — ED Notes (Signed)
Pt c/o multiple black stools to day and right upper abd pain.

## 2016-05-20 DIAGNOSIS — R1084 Generalized abdominal pain: Secondary | ICD-10-CM | POA: Diagnosis not present

## 2016-05-20 DIAGNOSIS — R079 Chest pain, unspecified: Secondary | ICD-10-CM | POA: Diagnosis not present

## 2016-05-20 DIAGNOSIS — R1030 Lower abdominal pain, unspecified: Secondary | ICD-10-CM | POA: Diagnosis not present

## 2016-05-20 LAB — TYPE AND SCREEN
ABO/RH(D): O NEG
Antibody Screen: NEGATIVE

## 2016-05-20 LAB — TROPONIN I: Troponin I: <0.03 ng/mL (ref <0.031)

## 2016-05-20 MED ORDER — ONDANSETRON 4 MG PO TBDP: 4.0000 mg | ORAL_TABLET | Freq: Three times a day (TID) | ORAL | Status: DC | PRN

## 2016-05-20 MED ORDER — IBUPROFEN 800 MG PO TABS: 800.0000 mg | ORAL_TABLET | Freq: Three times a day (TID) | ORAL | Status: DC | PRN

## 2016-05-20 NOTE — Discharge Instructions (Signed)
Abdominal Pain, Adult Many things can cause abdominal pain. Usually, abdominal pain is not caused by a disease and will improve without treatment. It can often be observed and treated at home. Your health care provider will do a physical exam and possibly order blood tests and X-rays to help determine the seriousness of your pain. However, in many cases, more time must pass before a clear cause of the pain can be found. Before that point, your health care provider may not know if you need more testing or further treatment. HOME CARE INSTRUCTIONS Monitor your abdominal pain for any changes. The following actions may help to alleviate any discomfort you are experiencing:  Only take over-the-counter or prescription medicines as directed by your health care provider.  Do not take laxatives unless directed to do so by your health care provider.  Try a clear liquid diet (broth, tea, or water) as directed by your health care provider. Slowly move to a bland diet as tolerated. SEEK MEDICAL CARE IF:  You have unexplained abdominal pain.  You have abdominal pain associated with nausea or diarrhea.  You have pain when you urinate or have a bowel movement.  You experience abdominal pain that wakes you in the night.  You have abdominal pain that is worsened or improved by eating food.  You have abdominal pain that is worsened with eating fatty foods.  You have a fever. SEEK IMMEDIATE MEDICAL CARE IF:  Your pain does not go away within 2 hours.  You keep throwing up (vomiting).  Your pain is felt only in portions of the abdomen, such as the right side or the left lower portion of the abdomen.  You pass bloody or black tarry stools. MAKE SURE YOU:  Understand these instructions.  Will watch your condition.  Will get help right away if you are not doing well or get worse.   This information is not intended to replace advice given to you by your health care provider. Make sure you discuss  any questions you have with your health care provider.   Document Released: 08/31/2005 Document Revised: 08/12/2015 Document Reviewed: 07/31/2013 Elsevier Interactive Patient Education 2016 [employer]  Nonspecific Chest Pain  Chest pain can be caused by many different conditions. There is always a chance that your pain could be related to something serious, such as a heart attack or a blood clot in your lungs. Chest pain can also be caused by conditions that are not life-threatening. If you have chest pain, it is very important to follow up with your health care provider. CAUSES  Chest pain can be caused by:  Heartburn.  Pneumonia or bronchitis.  Anxiety or stress.  Inflammation around your heart (pericarditis) or lung (pleuritis or pleurisy).  A blood clot in your lung.  A collapsed lung (pneumothorax). It can develop suddenly on its own (spontaneous pneumothorax) or from trauma to the chest.  Shingles infection (varicella-zoster virus).  Heart attack.  Damage to the bones, muscles, and cartilage that make up your chest wall. This can include:  Bruised bones due to injury.  Strained muscles or cartilage due to frequent or repeated coughing or overwork.  Fracture to one or more ribs.  Sore cartilage due to inflammation (costochondritis). RISK FACTORS  Risk factors for chest pain may include:  Activities that increase your risk for trauma or injury to your chest.  Respiratory infections or conditions that cause frequent coughing.  Medical conditions or overeating that can cause heartburn.  Heart disease or family  history of heart disease.  Conditions or health behaviors that increase your risk of developing a blood clot.  Having had chicken pox (varicella zoster). SIGNS AND SYMPTOMS Chest pain can feel like:  Burning or tingling on the surface of your chest or deep in your chest.  Crushing, pressure, aching, or squeezing pain.  Dull or sharp pain that is  worse when you move, cough, or take a deep breath.  Pain that is also felt in your back, neck, shoulder, or arm, or pain that spreads to any of these areas. Your chest pain may come and go, or it may stay constant. DIAGNOSIS Lab tests or other studies may be needed to find the cause of your pain. Your health care provider may have you take a test called an ambulatory ECG (electrocardiogram). An ECG records your heartbeat patterns at the time the test is performed. You may also have other tests, such as:  Transthoracic echocardiogram (TTE). During echocardiography, sound waves are used to create a picture of all of the heart structures and to look at how blood flows through your heart.  Transesophageal echocardiogram (TEE).This is a more advanced imaging test that obtains images from inside your body. It allows your health care provider to see your heart in finer detail.  Cardiac monitoring. This allows your health care provider to monitor your heart rate and rhythm in real time.  Holter monitor. This is a portable device that records your heartbeat and can help to diagnose abnormal heartbeats. It allows your health care provider to track your heart activity for several days, if needed.  Stress tests. These can be done through exercise or by taking medicine that makes your heart beat more quickly.  Blood tests.  Imaging tests. TREATMENT  Your treatment depends on what is causing your chest pain. Treatment may include:  Medicines. These may include:  Acid blockers for heartburn.  Anti-inflammatory medicine.  Pain medicine for inflammatory conditions.  Antibiotic medicine, if an infection is present.  Medicines to dissolve blood clots.  Medicines to treat coronary artery disease.  Supportive care for conditions that do not require medicines. This may include:  Resting.  Applying heat or cold packs to injured areas.  Limiting activities until pain decreases. HOME CARE  INSTRUCTIONS  If you were prescribed an antibiotic medicine, finish it all even if you start to feel better.  Avoid any activities that bring on chest pain.  Do not use any tobacco products, including cigarettes, chewing tobacco, or electronic cigarettes. If you need help quitting, ask your health care provider.  Do not drink alcohol.  Take medicines only as directed by your health care provider.  Keep all follow-up visits as directed by your health care provider. This is important. This includes any further testing if your chest pain does not go away.  If heartburn is the cause for your chest pain, you may be told to keep your head raised (elevated) while sleeping. This reduces the chance that acid will go from your stomach into your esophagus.  Make lifestyle changes as directed by your health care provider. These may include:  Getting regular exercise. Ask your health care provider to suggest some activities that are safe for you.  Eating a heart-healthy diet. A registered dietitian can help you to learn healthy eating options.  Maintaining a healthy weight.  Managing diabetes, if necessary.  Reducing stress. SEEK MEDICAL CARE IF:  Your chest pain does not go away after treatment.  You have a rash with  blisters on your chest.  You have a fever. SEEK IMMEDIATE MEDICAL CARE IF:   Your chest pain is worse.  You have an increasing cough, or you cough up blood.  You have severe abdominal pain.  You have severe weakness.  You faint.  You have chills.  You have sudden, unexplained chest discomfort.  You have sudden, unexplained discomfort in your arms, back, neck, or jaw.  You have shortness of breath at any time.  You suddenly start to sweat, or your skin gets clammy.  You feel nauseous or you vomit.  You suddenly feel light-headed or dizzy.  Your heart begins to beat quickly, or it feels like it is skipping beats. These symptoms may represent a serious  problem that is an emergency. Do not wait to see if the symptoms will go away. Get medical help right away. Call your local emergency services (911 in the [place] Do not drive yourself to the hospital.   This information is not intended to replace advice given to you by your health care provider. Make sure you discuss any questions you have with your health care provider.   Document Released: 08/31/2005 Document Revised: 12/12/2014 Document Reviewed: 06/27/2014 Elsevier Interactive Patient Education 2016 [employer] Canker Sores Canker sores are small, painful sores that develop inside your mouth. They may also be called aphthous ulcers. You can get canker sores on the inside of your lips or cheeks, on your tongue, or anywhere inside your mouth. You can have just one canker sore or several of them. Canker sores cannot be passed from one person to another (noncontagious). These sores are different than the sores that you may get on the outside of your lips (cold sores or fever blisters). Canker sores usually start as painful red bumps. Then they turn into small white, yellow, or gray ulcers that have red borders. The ulcers may be quite painful. The pain may be worse when you eat or drink. CAUSES The cause of this condition is not known. RISK FACTORS This condition is more likely to develop in:  Women.  People in their teens or 16s.  Women who are having their menstrual period.  People who are under a lot of emotional stress.  People who do not get enough iron or B vitamins.  People who have poor oral hygiene.  People who have an injury inside the mouth. This can happen after having dental work or from chewing something hard. SYMPTOMS Along with the canker sore, symptoms may also include:  Fever.  Fatigue.  Swollen lymph nodes in your neck. DIAGNOSIS This condition can be diagnosed based on your symptoms. Your health care provider will also examine your mouth. Your health care  provider may also do tests if you get canker sores often or if they are very bad. Tests may include:  Blood tests to rule out other causes of canker sores.  Taking swabs from the sore to check for infection.  Taking a small piece of skin from the sore (biopsy) to test it for cancer. TREATMENT Most canker sores clear up without treatment in about 10 days. Home care is usually the only treatment that you will need. Over-the-counter medicines can relieve discomfort.If you have severe canker sores, your health care provider may prescribe:  Numbing ointment to relieve pain.  Vitamins.  Steroid medicines. These may be given as:  Oral pills.  Mouth rinses.  Gels.  Antibiotic mouth rinse. HOME CARE INSTRUCTIONS  Apply, take, or use medicines only as directed  by your health care provider. These include vitamins.  If you were prescribed an antibiotic mouth rinse, finish all of it even if you start to feel better.  Until the sores are healed:  Do not drink coffee or citrus juices.  Do not eat spicy or salty foods.  Use a mild, over-the-counter mouth rinse as directed by your health care provider.  Practice good oral hygiene.  Floss your teeth every day.  Brush your teeth with a soft brush twice each day. SEEK MEDICAL CARE IF:  Your symptoms do not get better after two weeks.  You also have a fever or swollen glands.  You get canker sores often.  You have a canker sore that is getting larger.  You cannot eat or drink due to your canker sores.   This information is not intended to replace advice given to you by your health care provider. Make sure you discuss any questions you have with your health care provider.   Document Released: 03/18/2011 Document Revised: 04/07/2015 Document Reviewed: 10/22/2014 Elsevier Interactive Patient Education [employer]

## 2016-05-24 DIAGNOSIS — R202 Paresthesia of skin: Secondary | ICD-10-CM | POA: Diagnosis not present

## 2016-05-24 DIAGNOSIS — R1013 Epigastric pain: Secondary | ICD-10-CM | POA: Diagnosis not present

## 2016-05-24 DIAGNOSIS — J029 Acute pharyngitis, unspecified: Secondary | ICD-10-CM | POA: Diagnosis not present

## 2016-05-30 DIAGNOSIS — H25812 Combined forms of age-related cataract, left eye: Secondary | ICD-10-CM | POA: Diagnosis not present

## 2016-05-30 DIAGNOSIS — H2512 Age-related nuclear cataract, left eye: Secondary | ICD-10-CM | POA: Diagnosis not present

## 2016-06-09 DIAGNOSIS — E782 Mixed hyperlipidemia: Secondary | ICD-10-CM | POA: Diagnosis not present

## 2016-06-09 DIAGNOSIS — E1141 Type 2 diabetes mellitus with diabetic mononeuropathy: Secondary | ICD-10-CM | POA: Diagnosis not present

## 2016-06-09 DIAGNOSIS — E039 Hypothyroidism, unspecified: Secondary | ICD-10-CM | POA: Diagnosis not present

## 2016-06-20 DIAGNOSIS — E782 Mixed hyperlipidemia: Secondary | ICD-10-CM | POA: Diagnosis not present

## 2016-06-20 DIAGNOSIS — E1141 Type 2 diabetes mellitus with diabetic mononeuropathy: Secondary | ICD-10-CM | POA: Diagnosis not present

## 2016-06-20 DIAGNOSIS — D518 Other vitamin B12 deficiency anemias: Secondary | ICD-10-CM | POA: Diagnosis not present

## 2016-06-20 DIAGNOSIS — E039 Hypothyroidism, unspecified: Secondary | ICD-10-CM | POA: Diagnosis not present

## 2016-06-20 DIAGNOSIS — I10 Essential (primary) hypertension: Secondary | ICD-10-CM | POA: Diagnosis not present

## 2016-09-27 DIAGNOSIS — E119 Type 2 diabetes mellitus without complications: Secondary | ICD-10-CM | POA: Diagnosis not present

## 2016-09-27 DIAGNOSIS — D518 Other vitamin B12 deficiency anemias: Secondary | ICD-10-CM | POA: Diagnosis not present

## 2016-09-27 DIAGNOSIS — Z79899 Other long term (current) drug therapy: Secondary | ICD-10-CM | POA: Diagnosis not present

## 2016-09-27 DIAGNOSIS — E782 Mixed hyperlipidemia: Secondary | ICD-10-CM | POA: Diagnosis not present

## 2016-09-27 DIAGNOSIS — I10 Essential (primary) hypertension: Secondary | ICD-10-CM | POA: Diagnosis not present

## 2016-09-27 DIAGNOSIS — E039 Hypothyroidism, unspecified: Secondary | ICD-10-CM | POA: Diagnosis not present

## 2016-09-27 DIAGNOSIS — E1142 Type 2 diabetes mellitus with diabetic polyneuropathy: Secondary | ICD-10-CM | POA: Diagnosis not present

## 2016-09-27 DIAGNOSIS — E785 Hyperlipidemia, unspecified: Secondary | ICD-10-CM | POA: Diagnosis not present

## 2016-09-28 DIAGNOSIS — F331 Major depressive disorder, recurrent, moderate: Secondary | ICD-10-CM | POA: Diagnosis not present

## 2016-09-28 DIAGNOSIS — E1165 Type 2 diabetes mellitus with hyperglycemia: Secondary | ICD-10-CM | POA: Diagnosis not present

## 2016-09-28 DIAGNOSIS — E782 Mixed hyperlipidemia: Secondary | ICD-10-CM | POA: Diagnosis not present

## 2016-09-28 DIAGNOSIS — M542 Cervicalgia: Secondary | ICD-10-CM | POA: Diagnosis not present

## 2016-09-28 DIAGNOSIS — E039 Hypothyroidism, unspecified: Secondary | ICD-10-CM | POA: Diagnosis not present

## 2016-09-28 DIAGNOSIS — I10 Essential (primary) hypertension: Secondary | ICD-10-CM | POA: Diagnosis not present

## 2017-02-08 DIAGNOSIS — E039 Hypothyroidism, unspecified: Secondary | ICD-10-CM | POA: Diagnosis not present

## 2017-02-08 DIAGNOSIS — I10 Essential (primary) hypertension: Secondary | ICD-10-CM | POA: Diagnosis not present

## 2017-02-08 DIAGNOSIS — D518 Other vitamin B12 deficiency anemias: Secondary | ICD-10-CM | POA: Diagnosis not present

## 2017-02-08 DIAGNOSIS — E1165 Type 2 diabetes mellitus with hyperglycemia: Secondary | ICD-10-CM | POA: Diagnosis not present

## 2017-02-10 DIAGNOSIS — E039 Hypothyroidism, unspecified: Secondary | ICD-10-CM | POA: Diagnosis not present

## 2017-02-10 DIAGNOSIS — E782 Mixed hyperlipidemia: Secondary | ICD-10-CM | POA: Diagnosis not present

## 2017-02-10 DIAGNOSIS — E1165 Type 2 diabetes mellitus with hyperglycemia: Secondary | ICD-10-CM | POA: Diagnosis not present

## 2017-02-10 DIAGNOSIS — I10 Essential (primary) hypertension: Secondary | ICD-10-CM | POA: Diagnosis not present

## 2017-02-10 DIAGNOSIS — M542 Cervicalgia: Secondary | ICD-10-CM | POA: Diagnosis not present

## 2017-04-26 DIAGNOSIS — L298 Other pruritus: Secondary | ICD-10-CM | POA: Diagnosis not present

## 2017-04-26 DIAGNOSIS — S80861A Insect bite (nonvenomous), right lower leg, initial encounter: Secondary | ICD-10-CM | POA: Diagnosis not present

## 2017-04-26 DIAGNOSIS — Z6833 Body mass index (BMI) 33.0-33.9, adult: Secondary | ICD-10-CM | POA: Diagnosis not present

## 2017-05-13 DIAGNOSIS — E039 Hypothyroidism, unspecified: Secondary | ICD-10-CM | POA: Diagnosis not present

## 2017-05-13 DIAGNOSIS — N390 Urinary tract infection, site not specified: Secondary | ICD-10-CM | POA: Diagnosis not present

## 2017-06-09 DIAGNOSIS — E1165 Type 2 diabetes mellitus with hyperglycemia: Secondary | ICD-10-CM | POA: Diagnosis not present

## 2017-06-09 DIAGNOSIS — E039 Hypothyroidism, unspecified: Secondary | ICD-10-CM | POA: Diagnosis not present

## 2017-06-13 DIAGNOSIS — I10 Essential (primary) hypertension: Secondary | ICD-10-CM | POA: Diagnosis not present

## 2017-06-13 DIAGNOSIS — M542 Cervicalgia: Secondary | ICD-10-CM | POA: Diagnosis not present

## 2017-06-13 DIAGNOSIS — Z6835 Body mass index (BMI) 35.0-35.9, adult: Secondary | ICD-10-CM | POA: Diagnosis not present

## 2017-06-13 DIAGNOSIS — E782 Mixed hyperlipidemia: Secondary | ICD-10-CM | POA: Diagnosis not present

## 2017-06-13 DIAGNOSIS — D518 Other vitamin B12 deficiency anemias: Secondary | ICD-10-CM | POA: Diagnosis not present

## 2017-06-13 DIAGNOSIS — E1165 Type 2 diabetes mellitus with hyperglycemia: Secondary | ICD-10-CM | POA: Diagnosis not present

## 2017-06-13 DIAGNOSIS — F331 Major depressive disorder, recurrent, moderate: Secondary | ICD-10-CM | POA: Diagnosis not present

## 2017-06-13 DIAGNOSIS — E039 Hypothyroidism, unspecified: Secondary | ICD-10-CM | POA: Diagnosis not present

## 2017-06-13 DIAGNOSIS — Z0000 Encounter for general adult medical examination without abnormal findings: Secondary | ICD-10-CM | POA: Diagnosis not present

## 2017-06-13 DIAGNOSIS — Z7189 Other specified counseling: Secondary | ICD-10-CM | POA: Diagnosis not present

## 2017-06-26 ENCOUNTER — Other Ambulatory Visit (HOSPITAL_COMMUNITY): Payer: Self-pay | Admitting: Internal Medicine

## 2017-06-26 DIAGNOSIS — Z1231 Encounter for screening mammogram for malignant neoplasm of breast: Secondary | ICD-10-CM

## 2017-07-05 ENCOUNTER — Ambulatory Visit (HOSPITAL_COMMUNITY): Payer: Medicare Other

## 2017-07-12 ENCOUNTER — Ambulatory Visit (HOSPITAL_COMMUNITY)
Admission: RE | Admit: 2017-07-12 | Discharge: 2017-07-12 | Disposition: A | Discharge status: Home / Self Care | Payer: Medicare Other | Source: Ambulatory Visit | Attending: Internal Medicine | Admitting: Internal Medicine

## 2017-07-12 DIAGNOSIS — Z1231 Encounter for screening mammogram for malignant neoplasm of breast: Secondary | ICD-10-CM | POA: Insufficient documentation

## 2017-09-15 ENCOUNTER — Inpatient Hospital Stay (HOSPITAL_COMMUNITY)
Admission: AD | Admit: 2017-09-15 | Discharge: 2017-09-17 | DRG: 281 | Disposition: A | Discharge status: Home / Self Care | Payer: Medicare Other | Source: Other Acute Inpatient Hospital | Attending: Internal Medicine | Admitting: Internal Medicine

## 2017-09-15 DIAGNOSIS — Z743 Need for continuous supervision: Secondary | ICD-10-CM | POA: Diagnosis not present

## 2017-09-15 DIAGNOSIS — S299XXA Unspecified injury of thorax, initial encounter: Secondary | ICD-10-CM | POA: Diagnosis not present

## 2017-09-15 DIAGNOSIS — I2510 Atherosclerotic heart disease of native coronary artery without angina pectoris: Secondary | ICD-10-CM | POA: Diagnosis present

## 2017-09-15 DIAGNOSIS — Z23 Encounter for immunization: Secondary | ICD-10-CM

## 2017-09-15 DIAGNOSIS — R269 Unspecified abnormalities of gait and mobility: Secondary | ICD-10-CM | POA: Diagnosis present

## 2017-09-15 DIAGNOSIS — R7989 Other specified abnormal findings of blood chemistry: Secondary | ICD-10-CM

## 2017-09-15 DIAGNOSIS — Z8249 Family history of ischemic heart disease and other diseases of the circulatory system: Secondary | ICD-10-CM

## 2017-09-15 DIAGNOSIS — E1140 Type 2 diabetes mellitus with diabetic neuropathy, unspecified: Secondary | ICD-10-CM | POA: Diagnosis present

## 2017-09-15 DIAGNOSIS — Z79899 Other long term (current) drug therapy: Secondary | ICD-10-CM | POA: Diagnosis not present

## 2017-09-15 DIAGNOSIS — E118 Type 2 diabetes mellitus with unspecified complications: Secondary | ICD-10-CM | POA: Diagnosis not present

## 2017-09-15 DIAGNOSIS — Z7984 Long term (current) use of oral hypoglycemic drugs: Secondary | ICD-10-CM | POA: Diagnosis not present

## 2017-09-15 DIAGNOSIS — E039 Hypothyroidism, unspecified: Secondary | ICD-10-CM | POA: Diagnosis not present

## 2017-09-15 DIAGNOSIS — Z90710 Acquired absence of both cervix and uterus: Secondary | ICD-10-CM

## 2017-09-15 DIAGNOSIS — R279 Unspecified lack of coordination: Secondary | ICD-10-CM | POA: Diagnosis not present

## 2017-09-15 DIAGNOSIS — Z7982 Long term (current) use of aspirin: Secondary | ICD-10-CM | POA: Diagnosis not present

## 2017-09-15 DIAGNOSIS — F339 Major depressive disorder, recurrent, unspecified: Secondary | ICD-10-CM | POA: Diagnosis present

## 2017-09-15 DIAGNOSIS — M1990 Unspecified osteoarthritis, unspecified site: Secondary | ICD-10-CM | POA: Diagnosis present

## 2017-09-15 DIAGNOSIS — E785 Hyperlipidemia, unspecified: Secondary | ICD-10-CM | POA: Diagnosis present

## 2017-09-15 DIAGNOSIS — R0789 Other chest pain: Secondary | ICD-10-CM | POA: Diagnosis not present

## 2017-09-15 DIAGNOSIS — R748 Abnormal levels of other serum enzymes: Secondary | ICD-10-CM | POA: Diagnosis not present

## 2017-09-15 DIAGNOSIS — Z6833 Body mass index (BMI) 33.0-33.9, adult: Secondary | ICD-10-CM | POA: Diagnosis not present

## 2017-09-15 DIAGNOSIS — I429 Cardiomyopathy, unspecified: Secondary | ICD-10-CM | POA: Diagnosis not present

## 2017-09-15 DIAGNOSIS — R778 Other specified abnormalities of plasma proteins: Secondary | ICD-10-CM

## 2017-09-15 DIAGNOSIS — E119 Type 2 diabetes mellitus without complications: Secondary | ICD-10-CM

## 2017-09-15 DIAGNOSIS — I10 Essential (primary) hypertension: Secondary | ICD-10-CM

## 2017-09-15 DIAGNOSIS — R404 Transient alteration of awareness: Secondary | ICD-10-CM | POA: Diagnosis not present

## 2017-09-15 DIAGNOSIS — I214 Non-ST elevation (NSTEMI) myocardial infarction: Principal | ICD-10-CM | POA: Diagnosis present

## 2017-09-15 DIAGNOSIS — Z794 Long term (current) use of insulin: Secondary | ICD-10-CM

## 2017-09-15 DIAGNOSIS — E1159 Type 2 diabetes mellitus with other circulatory complications: Secondary | ICD-10-CM | POA: Diagnosis present

## 2017-09-15 DIAGNOSIS — Z886 Allergy status to analgesic agent status: Secondary | ICD-10-CM | POA: Diagnosis not present

## 2017-09-15 DIAGNOSIS — R109 Unspecified abdominal pain: Secondary | ICD-10-CM

## 2017-09-15 DIAGNOSIS — M546 Pain in thoracic spine: Secondary | ICD-10-CM | POA: Diagnosis not present

## 2017-09-15 DIAGNOSIS — I509 Heart failure, unspecified: Secondary | ICD-10-CM | POA: Diagnosis present

## 2017-09-15 DIAGNOSIS — F322 Major depressive disorder, single episode, severe without psychotic features: Secondary | ICD-10-CM | POA: Diagnosis not present

## 2017-09-15 DIAGNOSIS — I110 Hypertensive heart disease with heart failure: Secondary | ICD-10-CM | POA: Diagnosis present

## 2017-09-15 DIAGNOSIS — F329 Major depressive disorder, single episode, unspecified: Secondary | ICD-10-CM | POA: Diagnosis present

## 2017-09-15 DIAGNOSIS — I428 Other cardiomyopathies: Secondary | ICD-10-CM | POA: Diagnosis present

## 2017-09-15 DIAGNOSIS — Z885 Allergy status to narcotic agent status: Secondary | ICD-10-CM

## 2017-09-15 DIAGNOSIS — R531 Weakness: Secondary | ICD-10-CM | POA: Diagnosis not present

## 2017-09-15 DIAGNOSIS — R079 Chest pain, unspecified: Secondary | ICD-10-CM | POA: Diagnosis present

## 2017-09-15 DIAGNOSIS — D72829 Elevated white blood cell count, unspecified: Secondary | ICD-10-CM | POA: Diagnosis present

## 2017-09-15 LAB — TROPONIN I: TROPONIN I: 0.04 ng/mL — AB (ref <0.03)

## 2017-09-15 MED ORDER — INSULIN ASPART 100 UNIT/ML ~~LOC~~ SOLN
0.0000 [IU] | Freq: Every day | SUBCUTANEOUS | Status: DC
  Administered 2017-09-16: 3 [IU] via SUBCUTANEOUS

## 2017-09-15 MED ORDER — INSULIN ASPART 100 UNIT/ML ~~LOC~~ SOLN
0.0000 [IU] | Freq: Three times a day (TID) | SUBCUTANEOUS | Status: DC
  Administered 2017-09-16: 3 [IU] via SUBCUTANEOUS
  Administered 2017-09-16 – 2017-09-17 (×3): 5 [IU] via SUBCUTANEOUS
  Administered 2017-09-17: 11 [IU] via SUBCUTANEOUS

## 2017-09-15 MED ORDER — ACETAMINOPHEN 325 MG PO TABS
650.0000 mg | ORAL_TABLET | ORAL | Status: DC | PRN
  Administered 2017-09-16: 650 mg via ORAL
  Filled 2017-09-15: qty 2

## 2017-09-15 MED ORDER — INSULIN ASPART 100 UNIT/ML ~~LOC~~ SOLN: 0.0000 [IU] | Freq: Three times a day (TID) | SUBCUTANEOUS | Status: DC

## 2017-09-15 MED ORDER — ENOXAPARIN SODIUM 40 MG/0.4ML ~~LOC~~ SOLN
40.0000 mg | Freq: Every day | SUBCUTANEOUS | Status: DC
Start: 2017-09-16 — End: 2017-09-15

## 2017-09-15 MED ORDER — ONDANSETRON HCL 4 MG/2ML IJ SOLN: 4.0000 mg | Freq: Four times a day (QID) | INTRAMUSCULAR | Status: DC | PRN

## 2017-09-15 MED ORDER — NITROGLYCERIN 0.4 MG SL SUBL
0.4000 mg | SUBLINGUAL_TABLET | SUBLINGUAL | Status: DC | PRN
  Administered 2017-09-16 (×3): 0.4 mg via SUBLINGUAL
  Filled 2017-09-15: qty 1

## 2017-09-15 MED ORDER — GI COCKTAIL ~~LOC~~
30.0000 mL | Freq: Four times a day (QID) | ORAL | Status: DC | PRN
  Administered 2017-09-16: 30 mL via ORAL
  Filled 2017-09-15: qty 30

## 2017-09-15 MED ORDER — MORPHINE SULFATE (PF) 2 MG/ML IV SOLN
2.0000 mg | INTRAVENOUS | Status: DC | PRN
  Filled 2017-09-15: qty 1

## 2017-09-15 MED ORDER — INSULIN ASPART 100 UNIT/ML ~~LOC~~ SOLN: 0.0000 [IU] | Freq: Every day | SUBCUTANEOUS | Status: DC
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
## 2017-09-16 ENCOUNTER — Observation Stay (HOSPITAL_BASED_OUTPATIENT_CLINIC_OR_DEPARTMENT_OTHER): Payer: Medicare Other

## 2017-09-16 ENCOUNTER — Observation Stay (HOSPITAL_COMMUNITY): Payer: Medicare Other

## 2017-09-16 ENCOUNTER — Encounter (HOSPITAL_COMMUNITY): Payer: Self-pay

## 2017-09-16 DIAGNOSIS — Z23 Encounter for immunization: Secondary | ICD-10-CM | POA: Diagnosis not present

## 2017-09-16 DIAGNOSIS — R0789 Other chest pain: Secondary | ICD-10-CM | POA: Diagnosis not present

## 2017-09-16 DIAGNOSIS — I10 Essential (primary) hypertension: Secondary | ICD-10-CM | POA: Diagnosis not present

## 2017-09-16 DIAGNOSIS — Z794 Long term (current) use of insulin: Secondary | ICD-10-CM | POA: Diagnosis not present

## 2017-09-16 DIAGNOSIS — I2510 Atherosclerotic heart disease of native coronary artery without angina pectoris: Secondary | ICD-10-CM | POA: Diagnosis present

## 2017-09-16 DIAGNOSIS — I214 Non-ST elevation (NSTEMI) myocardial infarction: Secondary | ICD-10-CM | POA: Diagnosis present

## 2017-09-16 DIAGNOSIS — Z8249 Family history of ischemic heart disease and other diseases of the circulatory system: Secondary | ICD-10-CM | POA: Diagnosis not present

## 2017-09-16 DIAGNOSIS — D72829 Elevated white blood cell count, unspecified: Secondary | ICD-10-CM | POA: Diagnosis present

## 2017-09-16 DIAGNOSIS — Z7982 Long term (current) use of aspirin: Secondary | ICD-10-CM | POA: Diagnosis not present

## 2017-09-16 DIAGNOSIS — R079 Chest pain, unspecified: Secondary | ICD-10-CM

## 2017-09-16 DIAGNOSIS — Z7984 Long term (current) use of oral hypoglycemic drugs: Secondary | ICD-10-CM | POA: Diagnosis not present

## 2017-09-16 DIAGNOSIS — E1140 Type 2 diabetes mellitus with diabetic neuropathy, unspecified: Secondary | ICD-10-CM | POA: Diagnosis not present

## 2017-09-16 DIAGNOSIS — I509 Heart failure, unspecified: Secondary | ICD-10-CM | POA: Diagnosis not present

## 2017-09-16 DIAGNOSIS — Z885 Allergy status to narcotic agent status: Secondary | ICD-10-CM | POA: Diagnosis not present

## 2017-09-16 DIAGNOSIS — R748 Abnormal levels of other serum enzymes: Secondary | ICD-10-CM

## 2017-09-16 DIAGNOSIS — R109 Unspecified abdominal pain: Secondary | ICD-10-CM | POA: Diagnosis not present

## 2017-09-16 DIAGNOSIS — M1990 Unspecified osteoarthritis, unspecified site: Secondary | ICD-10-CM | POA: Diagnosis present

## 2017-09-16 DIAGNOSIS — R778 Other specified abnormalities of plasma proteins: Secondary | ICD-10-CM

## 2017-09-16 DIAGNOSIS — R7989 Other specified abnormal findings of blood chemistry: Secondary | ICD-10-CM

## 2017-09-16 DIAGNOSIS — E785 Hyperlipidemia, unspecified: Secondary | ICD-10-CM | POA: Diagnosis not present

## 2017-09-16 DIAGNOSIS — I110 Hypertensive heart disease with heart failure: Secondary | ICD-10-CM | POA: Diagnosis not present

## 2017-09-16 DIAGNOSIS — E039 Hypothyroidism, unspecified: Secondary | ICD-10-CM | POA: Diagnosis not present

## 2017-09-16 DIAGNOSIS — I428 Other cardiomyopathies: Secondary | ICD-10-CM | POA: Diagnosis not present

## 2017-09-16 DIAGNOSIS — F329 Major depressive disorder, single episode, unspecified: Secondary | ICD-10-CM | POA: Diagnosis present

## 2017-09-16 DIAGNOSIS — Z90710 Acquired absence of both cervix and uterus: Secondary | ICD-10-CM | POA: Diagnosis not present

## 2017-09-16 LAB — CBC WITH DIFFERENTIAL/PLATELET
Basophils Absolute: 0.1 10*3/uL (ref 0.0–0.1)
Basophils Relative: 0 %
Eosinophils Absolute: 0.7 10*3/uL (ref 0.0–0.7)
Eosinophils Relative: 6 %
HEMATOCRIT: 34.5 % — AB (ref 36.0–46.0)
HEMOGLOBIN: 11.2 g/dL — AB (ref 12.0–15.0)
LYMPHS ABS: 3.9 10*3/uL (ref 0.7–4.0)
LYMPHS PCT: 35 %
MCH: 25.6 pg — ABNORMAL LOW (ref 26.0–34.0)
MCHC: 32.5 g/dL (ref 30.0–36.0)
MCV: 78.8 fL (ref 78.0–100.0)
Monocytes Absolute: 0.6 10*3/uL (ref 0.1–1.0)
Monocytes Relative: 5 %
NEUTROS ABS: 6 10*3/uL (ref 1.7–7.7)
NEUTROS PCT: 54 %
Platelets: 309 10*3/uL (ref 150–400)
RBC: 4.38 MIL/uL (ref 3.87–5.11)
RDW: 14.4 % (ref 11.5–15.5)
WBC: 11.1 10*3/uL — AB (ref 4.0–10.5)

## 2017-09-16 LAB — BASIC METABOLIC PANEL
ANION GAP: 10 (ref 5–15)
Anion gap: 11 (ref 5–15)
BUN: 16 mg/dL (ref 6–20)
BUN: 19 mg/dL (ref 6–20)
CHLORIDE: 99 mmol/L — AB (ref 101–111)
CO2: 23 mmol/L (ref 22–32)
CO2: 24 mmol/L (ref 22–32)
Calcium: 8.6 mg/dL — ABNORMAL LOW (ref 8.9–10.3)
Calcium: 9 mg/dL (ref 8.9–10.3)
Chloride: 97 mmol/L — ABNORMAL LOW (ref 101–111)
Creatinine, Ser: 0.9 mg/dL (ref 0.44–1.00)
Creatinine, Ser: 0.9 mg/dL (ref 0.44–1.00)
GFR calc Af Amer: >60 mL/min (ref >60)
GFR calc non Af Amer: >60 mL/min (ref >60)
GLUCOSE: 235 mg/dL — AB (ref 65–99)
Glucose, Bld: 205 mg/dL — ABNORMAL HIGH (ref 65–99)
POTASSIUM: 3.6 mmol/L (ref 3.5–5.1)
POTASSIUM: 3.7 mmol/L (ref 3.5–5.1)
SODIUM: 130 mmol/L — AB (ref 135–145)
SODIUM: 134 mmol/L — AB (ref 135–145)

## 2017-09-16 LAB — GLUCOSE, CAPILLARY
GLUCOSE-CAPILLARY: 202 mg/dL — AB (ref 65–99)
GLUCOSE-CAPILLARY: 212 mg/dL — AB (ref 65–99)
GLUCOSE-CAPILLARY: 271 mg/dL — AB (ref 65–99)
Glucose-Capillary: 239 mg/dL — ABNORMAL HIGH (ref 65–99)
Glucose-Capillary: 160 mg/dL — ABNORMAL HIGH (ref 65–99)

## 2017-09-16 LAB — ECHOCARDIOGRAM COMPLETE
E decel time: 250 msec
EERAT: 6.49
FS: 28 % (ref 28–44)
Height: 65 in
IVS/LV PW RATIO, ED: 1.01
LA diam end sys: 44 mm
LADIAMINDEX: 2.18 cm/m2
LASIZE: 44 mm
LAVOL: 48 mL
LAVOLA4C: 48.8 mL
LAVOLIN: 23.8 mL/m2
LV E/e' medial: 6.49
LV E/e'average: 6.49
LV TDI E'MEDIAL: 5.55
LVELAT: 9.57 cm/s
LVOT VTI: 18.7 cm
LVOT area: 3.8 cm2
LVOT peak grad rest: 3 mmHg
LVOT peak vel: 82.2 cm/s
LVOTD: 22 mm
LVOTSV: 71 mL
Lateral S' vel: 12.3 cm/s
MV Dec: 250
MV pk E vel: 62.1 m/s
MVPKAVEL: 72 m/s
PW: 9.81 mm — AB (ref 0.6–1.1)
RV TAPSE: 20 mm
TDI e' lateral: 9.57
Weight: 3344 oz

## 2017-09-16 LAB — TROPONIN I
TROPONIN I: 0.03 ng/mL — AB (ref <0.03)
TROPONIN I: 0.03 ng/mL — AB (ref <0.03)
TROPONIN I: 0.05 ng/mL — AB (ref <0.03)
Troponin I: <0.03 ng/mL (ref <0.03)
Troponin I: <0.03 ng/mL (ref <0.03)

## 2017-09-16 LAB — MAGNESIUM: Magnesium: 1.3 mg/dL — ABNORMAL LOW (ref 1.7–2.4)

## 2017-09-16 LAB — TSH: TSH: 1.701 u[IU]/mL (ref 0.350–4.500)

## 2017-09-16 LAB — LIPID PANEL
CHOL/HDL RATIO: 5.5 ratio
CHOLESTEROL: 138 mg/dL (ref 0–200)
HDL: 25 mg/dL — ABNORMAL LOW (ref >40)
LDL CALC: UNDETERMINED mg/dL (ref 0–99)
Triglycerides: 530 mg/dL — ABNORMAL HIGH (ref <150)
VLDL: UNDETERMINED mg/dL (ref 0–40)

## 2017-09-16 LAB — HEPARIN LEVEL (UNFRACTIONATED)
HEPARIN UNFRACTIONATED: 0.1 [IU]/mL — AB (ref 0.30–0.70)
Heparin Unfractionated: 0.25 IU/mL — ABNORMAL LOW (ref 0.30–0.70)
Heparin Unfractionated: 0.25 IU/mL — ABNORMAL LOW (ref 0.30–0.70)

## 2017-09-16 MED ORDER — SIMVASTATIN 10 MG PO TABS
10.0000 mg | ORAL_TABLET | Freq: Every day | ORAL | Status: DC
  Administered 2017-09-16: 10 mg via ORAL
  Filled 2017-09-16: qty 1

## 2017-09-16 MED ORDER — SODIUM CHLORIDE 0.9 % IV BOLUS (SEPSIS)
250.0000 mL | Freq: Once | INTRAVENOUS | Status: AC
  Administered 2017-09-16: 250 mL via INTRAVENOUS

## 2017-09-16 MED ORDER — IOPAMIDOL (ISOVUE-370) INJECTION 76%
INTRAVENOUS | Status: AC
  Administered 2017-09-16: 80 mL
  Filled 2017-09-16: qty 100

## 2017-09-16 MED ORDER — DULOXETINE HCL 60 MG PO CPEP: 60.0000 mg | ORAL_CAPSULE | Freq: Every day | ORAL | Status: DC | PRN

## 2017-09-16 MED ORDER — METOPROLOL TARTRATE 5 MG/5ML IV SOLN
INTRAVENOUS | Status: AC
  Filled 2017-09-16: qty 5

## 2017-09-16 MED ORDER — CARVEDILOL 12.5 MG PO TABS
12.5000 mg | ORAL_TABLET | Freq: Two times a day (BID) | ORAL | Status: DC
  Administered 2017-09-16 – 2017-09-17 (×3): 12.5 mg via ORAL
  Filled 2017-09-16 (×3): qty 1

## 2017-09-16 MED ORDER — PNEUMOCOCCAL VAC POLYVALENT 25 MCG/0.5ML IJ INJ
0.5000 mL | INJECTION | INTRAMUSCULAR | Status: AC
  Administered 2017-09-17: 0.5 mL via INTRAMUSCULAR
  Filled 2017-09-16: qty 0.5

## 2017-09-16 MED ORDER — FENTANYL CITRATE (PF) 100 MCG/2ML IJ SOLN: 12.5000 ug | INTRAMUSCULAR | Status: DC | PRN

## 2017-09-16 MED ORDER — ASPIRIN EC 81 MG PO TBEC
81.0000 mg | DELAYED_RELEASE_TABLET | Freq: Every day | ORAL | Status: DC
Start: 2017-09-16 — End: 2017-09-17
  Administered 2017-09-16 – 2017-09-17 (×2): 81 mg via ORAL
  Filled 2017-09-16 (×2): qty 1

## 2017-09-16 MED ORDER — NITROGLYCERIN 0.4 MG SL SUBL
SUBLINGUAL_TABLET | SUBLINGUAL | Status: AC
  Filled 2017-09-16: qty 2

## 2017-09-16 MED ORDER — GEMFIBROZIL 600 MG PO TABS
600.0000 mg | ORAL_TABLET | Freq: Two times a day (BID) | ORAL | Status: DC
  Administered 2017-09-16: 600 mg via ORAL
  Filled 2017-09-16 (×2): qty 1

## 2017-09-16 MED ORDER — PANTOPRAZOLE SODIUM 40 MG IV SOLR
40.0000 mg | Freq: Two times a day (BID) | INTRAVENOUS | Status: DC
  Administered 2017-09-16 (×2): 40 mg via INTRAVENOUS
  Filled 2017-09-16 (×2): qty 40

## 2017-09-16 MED ORDER — HEPARIN (PORCINE) IN NACL 100-0.45 UNIT/ML-% IJ SOLN
1550.0000 [IU]/h | INTRAMUSCULAR | Status: DC
  Administered 2017-09-16: 1350 [IU]/h via INTRAVENOUS
  Administered 2017-09-17: 1550 [IU]/h via INTRAVENOUS
  Filled 2017-09-16 (×2): qty 250

## 2017-09-16 MED ORDER — INFLUENZA VAC SPLIT QUAD 0.5 ML IM SUSY
0.5000 mL | PREFILLED_SYRINGE | INTRAMUSCULAR | Status: AC
  Administered 2017-09-17: 0.5 mL via INTRAMUSCULAR
  Filled 2017-09-16: qty 0.5

## 2017-09-16 MED ORDER — NITROGLYCERIN 0.4 MG SL SUBL: 0.4000 mg | SUBLINGUAL_TABLET | SUBLINGUAL | Status: DC | PRN

## 2017-09-16 MED ORDER — LEVOTHYROXINE SODIUM 75 MCG PO TABS
150.0000 ug | ORAL_TABLET | Freq: Every day | ORAL | Status: DC
  Administered 2017-09-16 – 2017-09-17 (×2): 150 ug via ORAL
  Filled 2017-09-16 (×2): qty 2

## 2017-09-16 MED ORDER — LISINOPRIL 40 MG PO TABS
40.0000 mg | ORAL_TABLET | Freq: Every day | ORAL | Status: DC
  Administered 2017-09-16 – 2017-09-17 (×2): 40 mg via ORAL
  Filled 2017-09-16 (×2): qty 1

## 2017-09-16 MED ORDER — FENOFIBRATE 54 MG PO TABS
54.0000 mg | ORAL_TABLET | Freq: Every day | ORAL | Status: DC
  Administered 2017-09-17: 54 mg via ORAL
  Filled 2017-09-16: qty 1

## 2017-09-16 MED ORDER — METOPROLOL TARTRATE 5 MG/5ML IV SOLN
5.0000 mg | Freq: Once | INTRAVENOUS | Status: AC
  Administered 2017-09-16: 5 mg via INTRAVENOUS

## 2017-09-16 NOTE — Progress Notes (Addendum)
ANTICOAGULATION CONSULT NOTE - Initial Consult  Pharmacy Consult for Heparin  Indication: chest pain/ACS  Allergies  Allergen Reactions  . Codeine Itching  . Morphine And Related Itching  . Hydrocodone Itching and Rash   Patient Measurements: Height: 5\' 5"  (165.1 cm) Weight: 209 lb 4.8 oz (94.9 kg) IBW/kg (Calculated) : 57 Vital Signs: Temp: 98.4 F (36.9 C) (10/12 2140) Temp Source: Oral (10/12 2140) BP: 174/87 (10/12 2140) Pulse Rate: 100 (10/12 2140)  Labs:  Recent Labs  09/15/17 2216 09/16/17 0009 09/16/17 0011  HGB  --   --  11.2*  HCT  --   --  34.5*  PLT  --   --  309  CREATININE  --   --  0.90  TROPONINI 0.04* 0.05*  --     Estimated Creatinine Clearance: 82.4 mL/min (by C-G formula based on SCr of 0.9 mg/dL).  Assessment: 55 y/o F transfer from outside hospital for CP, pt arrived on heparin drip at 1000 units/hr, no issues per RN.   Goal of Therapy:  Heparin level 0.3-0.7 units/ml Monitor platelets by anticoagulation protocol: Yes   Plan:  -Cont heparin at 1000 units/hr -0500 HL  Kian Ottaviano 09/16/2017,1:44 AM   ================== Addendum 6:02 AM Heparin level low this AM, no issues per RN -Inc heparin to 1200 units/hr -1400 HL Abran Duke, PharmD, BCPS Clinical Pharmacist Phone: [phone] ===================

## 2017-09-16 NOTE — Progress Notes (Signed)
ANTICOAGULATION CONSULT NOTE  Pharmacy Consult for Heparin  Indication: chest pain/ACS  Allergies  Allergen Reactions  . Codeine Itching  . Morphine And Related Itching  . Hydrocodone Itching and Rash   Patient Measurements: Height: 5\' 5"  (165.1 cm) Weight: 209 lb (94.8 kg) IBW/kg (Calculated) : 57  HDWt: 78.4 kg  Vital Signs: Temp: 98.2 F (36.8 C) (10/13 0456) Temp Source: Oral (10/13 0456) BP: 134/82 (10/13 1400) Pulse Rate: 84 (10/13 0456)  Labs:  Recent Labs  09/16/17 0009 09/16/17 0011 09/16/17 0424 09/16/17 0929 09/16/17 1515  HGB  --  11.2*  --   --   --   HCT  --  34.5*  --   --   --   PLT  --  309  --   --   --   HEPARINUNFRC  --   --  0.10*  --  0.25*  CREATININE  --  0.90  --  0.90  --   TROPONINI 0.05*  --  0.03* 0.03*  --     Estimated Creatinine Clearance: 82.3 mL/min (by C-G formula based on SCr of 0.9 mg/dL).  Assessment: 56 y/o F continuing on heparin for ACS. Heparin level improved but remains low at 0.25 after rate increase. Hg 11.2, plt wnl. No bleed or IV line issues per RN. Not on anticoagulation PTA.  Confirmed with RN - not charted but drip has been running at 1200 units/hr all shift today  Goal of Therapy:  Heparin level 0.3-0.7 units/ml Monitor platelets by anticoagulation protocol: Yes   Plan:  Increase heparin to 1350 units/h 6h heparin level Daily heparin level/CBC Monitor for s/sx bleeding F/u Cardiology plans   Babs Bertin, PharmD, BCPS Clinical Pharmacist 09/16/2017 4:15 PM
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
## 2017-09-16 NOTE — Consult Note (Signed)
Cardiology Consultation:   Patient ID: [name]; [id number]; [birth year]   Admit date: 09/15/2017 Date of Consult: 09/16/2017  Primary Care Provider: Benita Stabile, MD Primary Cardiologist: Dr. Dina Rich [facility]) - last seen in 2015   Patient Profile:   Ashley Watts is a 56 y.o. female with a hx of non-ischemic cardiomyopathy (EF was 20-25 but improved to normal), hypertension, hyperlipidemia, diabetes who is being seen today for the evaluation of chest pain at the request of Dr. Hartley Barefoot.  History of Present Illness:   Ashley Watts underwent Cardiac Catheterization in 2013 that demonstrated normal coronary arteries.  Her last echocardiogram in 2015 demonstrated normal ejection fraction and mild diastolic dysfunction.  She was last seen by Dr. Dina Rich in 2015.  She presented to [facility] with chest pain and was transferred to [facility] for further evaluation.  She has had chest pain for the last 4 days.  She has had some relief with antacids.  She was given nitroglycerin here with some relief as well.  She denies any worsening with exertion, lying flat or taking a deep breath.  She denies any shortness of breath, orthopnea, paroxysmal nocturnal dyspnea.  She denies syncope.  She has chronic pedal edema without significant change.  She has had recent back pain.  She does have frequent urination but this is chronic since her hysterectomy.  She denies melena or hematochezia.  She has a chronic nonproductive cough. She notes significant fatigue.   Data since admit: Troponin 0.04 - 0.05 - 0.03 K 3.6, SCr 0.90 Trigs 530 Hgb 11.2 EKG not acute Echo pending  Past Medical History:  Diagnosis Date  . Anxiety   . Arthritis   . Chest pain 11/2012  . CHF (congestive heart failure) (HCC)   . Depression   . Diabetes mellitus without complication (HCC)   . Headache(784.0)   . Hypertension   . Hypothyroidism   . Neuromuscular disorder (HCC)    DIABETIC NEUROPATHY  . Shortness of breath     Past Surgical History:  Procedure Laterality Date  . ABDOMINAL HYSTERECTOMY    . LEFT AND RIGHT HEART CATHETERIZATION WITH CORONARY ANGIOGRAM N/A 11/20/2012   Procedure: LEFT AND RIGHT HEART CATHETERIZATION WITH CORONARY ANGIOGRAM;  Surgeon: Peter M Swaziland, MD;  Location: [facility] CATH LAB;  Service: Cardiovascular;  Laterality: N/A;       Inpatient Medications: Scheduled Meds: . aspirin EC  81 mg Oral Daily  . carvedilol  12.5 mg Oral BID WC  . gemfibrozil  600 mg Oral BID AC  . [START ON 09/17/2017] Influenza vac split quadrivalent PF  0.5 mL Intramuscular Tomorrow-1000  . insulin aspart  0-15 Units Subcutaneous TID WC  . insulin aspart  0-5 Units Subcutaneous QHS  . levothyroxine  150 mcg Oral QAC breakfast  . lisinopril  40 mg Oral Daily  . pantoprazole (PROTONIX) IV  40 mg Intravenous Q12H  . [START ON 09/17/2017] pneumococcal 23 valent vaccine  0.5 mL Intramuscular Tomorrow-1000  . simvastatin  10 mg Oral q1800   Continuous Infusions: . heparin 1,000 Units/hr (09/16/17 0200)   PRN Meds: acetaminophen, DULoxetine, fentaNYL (SUBLIMAZE) injection, gi cocktail, nitroGLYCERIN, ondansetron (ZOFRAN) IV  Allergies:    Allergies  Allergen Reactions  . Codeine Itching  . Morphine And Related Itching  . Hydrocodone Itching and Rash    Social History:   Social History   Social History  . Marital status: Divorced    Spouse name: N/A  . Number of  children: N/A  . Years of education: N/A   Occupational History  . Not on file.   Social History Main Topics  . Smoking status: Never Smoker  . Smokeless tobacco: Never Used  . Alcohol use No  . Drug use: No  . Sexual activity: Not Currently   Other Topics Concern  . Not on file   Social History Narrative  . No narrative on file    Family History:    Family History  Problem Relation Age of Onset  . Heart attack Father 75     ROS:  Please see the history of present  illness.  ROS  All other ROS reviewed and negative.     Physical Exam/Data:   Vitals:   09/16/17 0854 09/16/17 0924 09/16/17 0938 09/16/17 1100  BP: (!) 146/91 (!) 141/75 118/69 135/77  Pulse:      Resp:  15 18 (!) 24  Temp:      TempSrc:      SpO2:      Weight:      Height:        Intake/Output Summary (Last 24 hours) at 09/16/17 1139 Last data filed at 09/16/17 0900  Gross per 24 hour  Intake                0 ml  Output                0 ml  Net                0 ml   Filed Weights   09/15/17 2140 09/16/17 0456  Weight: 209 lb 4.8 oz (94.9 kg) 209 lb (94.8 kg)   Body mass index is 34.78 kg/m.  General:  Well nourished, well developed, in no acute distress  HEENT: normal Lymph: no adenopathy Neck: no JVD Endocrine:  No thryomegaly Vascular: No carotid bruits   Cardiac:  normal S1, S2; RRR; no murmur   Lungs:  clear to auscultation bilaterally, no wheezing, rhonchi or rales  Abd: soft, nontender, no hepatomegaly  Ext: no pitting edema Musculoskeletal:  No deformities  Skin: warm and dry  Neuro:  CNs 2-12 intact, no focal abnormalities noted Psych:  Normal affect   EKG:  The EKG was personally reviewed and demonstrates:  normal sinus rhythm, HR 77, LAD, QTc 488 ms  Telemetry:  Telemetry was personally reviewed and demonstrates:  normal sinus rhythm   Relevant CV Studies:  Echo 04/14/14 Mild LVH, EF 60-65, no RWMA, Gr 1 DD  Echo 3/14 Mild LVH, EF 55-60, Gr 1 DD  Cardiac Catheterization 11/20/12 Left mainstem: Normal.  Left anterior descending (LAD): Normal.  Left circumflex (LCx): Normal.  Right coronary artery (RCA): Normal.  Left ventriculography: Left ventricular size is mildly increased. There is severe global hypokinesis with ejection fraction of 20-25%. There is minimal mitral insufficiency.  Final Conclusions:  1. Normal coronary anatomy.  2. Severe left ventricular dysfunction.  3. Mild to moderate pulmonary hypertension.  Recommendations: We'll  optimize her medical therapy for treatment of her congestive heart failure. We'll ask heart failure team to evaluate.   Laboratory Data:  Chemistry  Recent Labs Lab 09/16/17 0011 09/16/17 0929  NA 134* 130*  K 3.6 3.7  CL 99* 97*  CO2 24 23  GLUCOSE 205* 235*  BUN 19 16  CREATININE 0.90 0.90  CALCIUM 9.0 8.6*  GFRNONAA >60 >60  GFRAA >60 >60  ANIONGAP 11 10    No results for input(s): PROT, ALBUMIN,  AST, ALT, ALKPHOS, BILITOT in the last 168 hours. Hematology  Recent Labs Lab 09/16/17 0011  WBC 11.1*  RBC 4.38  HGB 11.2*  HCT 34.5*  MCV 78.8  MCH 25.6*  MCHC 32.5  RDW 14.4  PLT 309   Cardiac Enzymes  Recent Labs Lab 09/15/17 2216 09/16/17 0009 09/16/17 0424 09/16/17 0929  TROPONINI 0.04* 0.05* 0.03* 0.03*   No results for input(s): TROPIPOC in the last 168 hours.  BNPNo results for input(s): BNP, PROBNP in the last 168 hours.  DDimer No results for input(s): DDIMER in the last 168 hours.  Radiology/Studies:  Dg Abd 1 View  Result Date: 09/16/2017 CLINICAL DATA:  Bilateral flank pain for 2 days. EXAM: ABDOMEN - 1 VIEW COMPARISON:  CT abdomen and pelvis 05/20/2016. FINDINGS: The bowel gas pattern is normal. No radio-opaque calculi or other significant radiographic abnormality are seen. IMPRESSION: Negative exam. Electronically Signed   By: Drusilla Kanner M.D.   On: 09/16/2017 10:44   Dg Chest Port 1 View  Result Date: 09/16/2017 CLINICAL DATA:  Bilateral upper chest pain for 3 days. EXAM: PORTABLE CHEST 1 VIEW COMPARISON:  Single-view of the chest 09/15/2017. PA and lateral chest 05/20/2016. FINDINGS: Lungs clear. Heart size normal. No pneumothorax or pleural fluid. No bony abnormality. IMPRESSION: Negative chest. Electronically Signed   By: Drusilla Kanner M.D.   On: 09/16/2017 10:43    Assessment and Plan:   1. Chest pain Minimally elevated Troponin levels without clear trend. Her ECG is without ischemic changes.  She had normal coronary arteries  by Cardiac Catheterization in 2013.  Last echo in 2015 showed normal ejection fraction.  Symptoms are atypical for ischemia.  Echocardiogram is currently pending.  Since she has had minimally elevated troponin levels, we will proceed with coronary CTA to definitively rule out CAD.  2. Non-Ischemic CM Prior EF 20-25.  This improved to normal by echocardiogram in 2015.  Echocardiogram pending.  Continue ACE inhibitor, beta-blocker.   3. Hypertension  Continue beta-blocker, ACE inhibitor  4. Hyperlipidemia Triglycerides are too high.  Continue gemfibrozil, statin  5. Diabetes Mellitus Per primary care  6. Fatigue TSH pending  For questions or updates, please contact [facility] Please consult [email or web address] for contact info under Cardiology/STEMI.   Signed, Tereso Newcomer, PA-C  09/16/2017 11:39 AM

## 2017-09-16 NOTE — Progress Notes (Addendum)
ANTICOAGULATION CONSULT NOTE   Pharmacy Consult for Heparin  Indication: chest pain/ACS  Allergies  Allergen Reactions  . Codeine Itching  . Morphine And Related Itching  . Hydrocodone Itching and Rash   Patient Measurements: Height: 5\' 5"  (165.1 cm) Weight: 209 lb (94.8 kg) IBW/kg (Calculated) : 57 Vital Signs: Temp: 99.2 F (37.3 C) (10/13 2006) Temp Source: Oral (10/13 2006) BP: 128/66 (10/13 2006) Pulse Rate: 72 (10/13 2006)  Labs:  Recent Labs  09/16/17 0011 09/16/17 0424 09/16/17 0929 09/16/17 1515 09/16/17 2152  HGB 11.2*  --   --   --   --   HCT 34.5*  --   --   --   --   PLT 309  --   --   --   --   HEPARINUNFRC  --  0.10*  --  0.25* 0.25*  CREATININE 0.90  --  0.90  --   --   TROPONINI  --  0.03* 0.03* <0.03 <0.03    Estimated Creatinine Clearance: 82.3 mL/min (by C-G formula based on SCr of 0.9 mg/dL).  Assessment: 56 y/o F transfer from outside hospital for CP, heparin level sub-therapeutic on 1350 units/hr of heparin, no issues per RN.   Goal of Therapy:  Heparin level 0.3-0.7 units/ml Monitor platelets by anticoagulation protocol: Yes   Plan:  -Inc to heparin to 1550 units/hr -0600 HL  Deniz Hannan 09/16/2017,11:37 PM   ================================ Addendum 6:12 AM Heparin level therapeutic x 1 this AM -Cont heparin at 1550 units/hr -1200 HL Abran Duke, PharmD, BCPS Clinical Pharmacist Phone: [phone] =================================

## 2017-09-16 NOTE — Progress Notes (Signed)
  Echocardiogram 2D Echocardiogram has been performed.  Ashley Watts 09/16/2017, 1:44 PM

## 2017-09-17 ENCOUNTER — Encounter (HOSPITAL_COMMUNITY): Payer: Self-pay | Admitting: *Deleted

## 2017-09-17 DIAGNOSIS — R0789 Other chest pain: Secondary | ICD-10-CM

## 2017-09-17 LAB — HEMOGLOBIN A1C
Hgb A1c MFr Bld: 10.5 % — ABNORMAL HIGH (ref 4.8–5.6)
MEAN PLASMA GLUCOSE: 255 mg/dL

## 2017-09-17 LAB — CBC
HEMATOCRIT: 33.2 % — AB (ref 36.0–46.0)
Hemoglobin: 10.6 g/dL — ABNORMAL LOW (ref 12.0–15.0)
MCH: 25.5 pg — AB (ref 26.0–34.0)
MCHC: 31.9 g/dL (ref 30.0–36.0)
MCV: 79.8 fL (ref 78.0–100.0)
PLATELETS: 298 10*3/uL (ref 150–400)
RBC: 4.16 MIL/uL (ref 3.87–5.11)
RDW: 14.4 % (ref 11.5–15.5)
WBC: 7.3 10*3/uL (ref 4.0–10.5)

## 2017-09-17 LAB — HEPARIN LEVEL (UNFRACTIONATED): Heparin Unfractionated: 0.54 IU/mL (ref 0.30–0.70)

## 2017-09-17 LAB — URINALYSIS, ROUTINE W REFLEX MICROSCOPIC
Bilirubin Urine: NEGATIVE
Glucose, UA: >=500 mg/dL — AB
Hgb urine dipstick: NEGATIVE
Ketones, ur: NEGATIVE mg/dL
Leukocytes, UA: NEGATIVE
Nitrite: NEGATIVE
PROTEIN: NEGATIVE mg/dL
SPECIFIC GRAVITY, URINE: 1.024 (ref 1.005–1.030)
pH: 6 (ref 5.0–8.0)

## 2017-09-17 LAB — HIV ANTIBODY (ROUTINE TESTING W REFLEX): HIV SCREEN 4TH GENERATION: NONREACTIVE

## 2017-09-17 LAB — GLUCOSE, CAPILLARY
GLUCOSE-CAPILLARY: 331 mg/dL — AB (ref 65–99)
Glucose-Capillary: 236 mg/dL — ABNORMAL HIGH (ref 65–99)

## 2017-09-17 MED ORDER — PANTOPRAZOLE SODIUM 40 MG PO TBEC
40.0000 mg | DELAYED_RELEASE_TABLET | Freq: Two times a day (BID) | ORAL | Status: DC
  Administered 2017-09-17: 40 mg via ORAL
  Filled 2017-09-17: qty 1

## 2017-09-17 MED ORDER — FENOFIBRATE 54 MG PO TABS: 54.0000 mg | ORAL_TABLET | Freq: Every day | ORAL | 0 refills | Status: DC

## 2017-09-17 MED ORDER — ASPIRIN 81 MG PO TBEC: 81.0000 mg | DELAYED_RELEASE_TABLET | Freq: Every day | ORAL | 0 refills | Status: AC

## 2017-09-17 MED ORDER — PANTOPRAZOLE SODIUM 40 MG PO TBEC: 40.0000 mg | DELAYED_RELEASE_TABLET | Freq: Two times a day (BID) | ORAL | 0 refills | Status: DC

## 2017-09-17 MED ORDER — SIMVASTATIN 10 MG PO TABS: 10.0000 mg | ORAL_TABLET | Freq: Every day | ORAL | 0 refills | Status: DC

## 2017-09-17 NOTE — Progress Notes (Addendum)
Coronary CTA with mild non obstructive CAD 25-50% prox LAD and LCX.  Calcium score 64 which is 95th % for age and sex matched controls.   Needs aggressive risk factor modification.   No further cardiac workup recommended.  Will sign off.

## 2017-09-17 NOTE — Progress Notes (Signed)
I reviewed d/c instructions with patient and she had no further questions. I asked the tech to get her a wheelchair. Pt stated son was outside in his car in the parking lot waiting for her to be sent home. I did not review any instructions with son as he wasn't at bedside. When tech returned to room with wheelchair, patient appeared to have already left her room
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
## 2017-11-21 DIAGNOSIS — G589 Mononeuropathy, unspecified: Secondary | ICD-10-CM | POA: Diagnosis not present

## 2017-11-21 DIAGNOSIS — D518 Other vitamin B12 deficiency anemias: Secondary | ICD-10-CM | POA: Diagnosis not present

## 2017-11-21 DIAGNOSIS — E039 Hypothyroidism, unspecified: Secondary | ICD-10-CM | POA: Diagnosis not present

## 2017-11-21 DIAGNOSIS — R42 Dizziness and giddiness: Secondary | ICD-10-CM | POA: Diagnosis not present

## 2017-11-21 DIAGNOSIS — R0789 Other chest pain: Secondary | ICD-10-CM | POA: Diagnosis not present

## 2017-11-21 DIAGNOSIS — F331 Major depressive disorder, recurrent, moderate: Secondary | ICD-10-CM | POA: Diagnosis not present

## 2017-11-21 DIAGNOSIS — E782 Mixed hyperlipidemia: Secondary | ICD-10-CM | POA: Diagnosis not present

## 2017-11-21 DIAGNOSIS — E1165 Type 2 diabetes mellitus with hyperglycemia: Secondary | ICD-10-CM | POA: Diagnosis not present

## 2017-11-21 DIAGNOSIS — I10 Essential (primary) hypertension: Secondary | ICD-10-CM | POA: Diagnosis not present

## 2017-11-21 DIAGNOSIS — Z6834 Body mass index (BMI) 34.0-34.9, adult: Secondary | ICD-10-CM | POA: Diagnosis not present

## 2017-11-21 DIAGNOSIS — M542 Cervicalgia: Secondary | ICD-10-CM | POA: Diagnosis not present

## 2018-04-05 DIAGNOSIS — Z760 Encounter for issue of repeat prescription: Secondary | ICD-10-CM | POA: Diagnosis not present

## 2018-05-10 DIAGNOSIS — E1165 Type 2 diabetes mellitus with hyperglycemia: Secondary | ICD-10-CM | POA: Diagnosis not present

## 2018-05-10 DIAGNOSIS — E039 Hypothyroidism, unspecified: Secondary | ICD-10-CM | POA: Diagnosis not present

## 2018-05-14 DIAGNOSIS — Z6835 Body mass index (BMI) 35.0-35.9, adult: Secondary | ICD-10-CM | POA: Diagnosis not present

## 2018-05-14 DIAGNOSIS — I10 Essential (primary) hypertension: Secondary | ICD-10-CM | POA: Diagnosis not present

## 2018-05-14 DIAGNOSIS — E782 Mixed hyperlipidemia: Secondary | ICD-10-CM | POA: Diagnosis not present

## 2018-05-14 DIAGNOSIS — D518 Other vitamin B12 deficiency anemias: Secondary | ICD-10-CM | POA: Diagnosis not present

## 2018-05-14 DIAGNOSIS — E039 Hypothyroidism, unspecified: Secondary | ICD-10-CM | POA: Diagnosis not present

## 2018-05-14 DIAGNOSIS — F331 Major depressive disorder, recurrent, moderate: Secondary | ICD-10-CM | POA: Diagnosis not present

## 2018-05-14 DIAGNOSIS — E1165 Type 2 diabetes mellitus with hyperglycemia: Secondary | ICD-10-CM | POA: Diagnosis not present

## 2018-05-14 DIAGNOSIS — M545 Low back pain: Secondary | ICD-10-CM | POA: Diagnosis not present

## 2018-05-22 DIAGNOSIS — R300 Dysuria: Secondary | ICD-10-CM | POA: Diagnosis not present

## 2018-05-22 DIAGNOSIS — N390 Urinary tract infection, site not specified: Secondary | ICD-10-CM | POA: Diagnosis not present

## 2018-09-01 DIAGNOSIS — Z7984 Long term (current) use of oral hypoglycemic drugs: Secondary | ICD-10-CM | POA: Diagnosis not present

## 2018-09-01 DIAGNOSIS — K219 Gastro-esophageal reflux disease without esophagitis: Secondary | ICD-10-CM | POA: Diagnosis not present

## 2018-09-01 DIAGNOSIS — Z79890 Hormone replacement therapy (postmenopausal): Secondary | ICD-10-CM | POA: Diagnosis not present

## 2018-09-01 DIAGNOSIS — Z79899 Other long term (current) drug therapy: Secondary | ICD-10-CM | POA: Diagnosis not present

## 2018-09-01 DIAGNOSIS — Z794 Long term (current) use of insulin: Secondary | ICD-10-CM | POA: Diagnosis not present

## 2018-09-01 DIAGNOSIS — K760 Fatty (change of) liver, not elsewhere classified: Secondary | ICD-10-CM | POA: Diagnosis not present

## 2018-09-01 DIAGNOSIS — K5900 Constipation, unspecified: Secondary | ICD-10-CM | POA: Diagnosis not present

## 2018-09-01 DIAGNOSIS — I10 Essential (primary) hypertension: Secondary | ICD-10-CM | POA: Diagnosis not present

## 2018-09-01 DIAGNOSIS — E1165 Type 2 diabetes mellitus with hyperglycemia: Secondary | ICD-10-CM | POA: Diagnosis not present

## 2018-09-21 DIAGNOSIS — H6983 Other specified disorders of Eustachian tube, bilateral: Secondary | ICD-10-CM | POA: Diagnosis not present

## 2018-09-21 DIAGNOSIS — J00 Acute nasopharyngitis [common cold]: Secondary | ICD-10-CM | POA: Diagnosis not present

## 2018-12-17 ENCOUNTER — Ambulatory Visit: Payer: Medicare Other | Admitting: Family Medicine

## 2018-12-18 ENCOUNTER — Ambulatory Visit: Payer: Self-pay | Admitting: Licensed Clinical Social Worker

## 2018-12-18 ENCOUNTER — Ambulatory Visit (INDEPENDENT_AMBULATORY_CARE_PROVIDER_SITE_OTHER): Payer: Medicare Other | Admitting: Pediatrics

## 2018-12-18 ENCOUNTER — Encounter: Payer: Self-pay | Admitting: Pediatrics

## 2018-12-18 VITALS — BP 139/89 | HR 84 | Temp 98.0°F | Ht 65.0 in | Wt 207.4 lb

## 2018-12-18 DIAGNOSIS — E1169 Type 2 diabetes mellitus with other specified complication: Secondary | ICD-10-CM

## 2018-12-18 DIAGNOSIS — Z794 Long term (current) use of insulin: Secondary | ICD-10-CM | POA: Diagnosis not present

## 2018-12-18 DIAGNOSIS — F339 Major depressive disorder, recurrent, unspecified: Secondary | ICD-10-CM

## 2018-12-18 DIAGNOSIS — K219 Gastro-esophageal reflux disease without esophagitis: Secondary | ICD-10-CM

## 2018-12-18 DIAGNOSIS — I10 Essential (primary) hypertension: Secondary | ICD-10-CM | POA: Diagnosis not present

## 2018-12-18 DIAGNOSIS — E039 Hypothyroidism, unspecified: Secondary | ICD-10-CM

## 2018-12-18 DIAGNOSIS — E785 Hyperlipidemia, unspecified: Secondary | ICD-10-CM

## 2018-12-18 DIAGNOSIS — E1159 Type 2 diabetes mellitus with other circulatory complications: Secondary | ICD-10-CM | POA: Diagnosis not present

## 2018-12-18 DIAGNOSIS — R300 Dysuria: Secondary | ICD-10-CM

## 2018-12-18 LAB — BAYER DCA HB A1C WAIVED: HB A1C: 10.5 % — AB (ref <7.0)

## 2018-12-18 LAB — URINALYSIS, COMPLETE
Bilirubin, UA: NEGATIVE
LEUKOCYTES UA: NEGATIVE
Nitrite, UA: NEGATIVE
PROTEIN UA: NEGATIVE
RBC, UA: NEGATIVE
Specific Gravity, UA: 1.015 (ref 1.005–1.030)
UUROB: 0.2 mg/dL (ref 0.2–1.0)
pH, UA: 5.5 (ref 5.0–7.5)

## 2018-12-18 LAB — MICROSCOPIC EXAMINATION: Renal Epithel, UA: NONE SEEN /hpf

## 2018-12-18 MED ORDER — LEVOTHYROXINE SODIUM 150 MCG PO TABS: 150.0000 ug | ORAL_TABLET | Freq: Every day | ORAL | 1 refills | Status: DC

## 2018-12-18 MED ORDER — PANTOPRAZOLE SODIUM 40 MG PO TBEC: 40.0000 mg | DELAYED_RELEASE_TABLET | Freq: Every day | ORAL | 1 refills | Status: DC

## 2018-12-18 MED ORDER — CARVEDILOL 12.5 MG PO TABS: 12.5000 mg | ORAL_TABLET | Freq: Two times a day (BID) | ORAL | 1 refills | Status: DC

## 2018-12-18 MED ORDER — ACCU-CHEK SOFT TOUCH LANCETS MISC: 12 refills | Status: AC

## 2018-12-18 MED ORDER — SIMVASTATIN 10 MG PO TABS: 10.0000 mg | ORAL_TABLET | Freq: Every day | ORAL | 1 refills | Status: DC

## 2018-12-18 MED ORDER — LISINOPRIL 40 MG PO TABS: 40.0000 mg | ORAL_TABLET | Freq: Every day | ORAL | 1 refills | Status: DC

## 2018-12-18 MED ORDER — DULOXETINE HCL 20 MG PO CPEP: 20.0000 mg | ORAL_CAPSULE | Freq: Every day | ORAL | 1 refills | Status: DC

## 2018-12-18 MED ORDER — ONETOUCH VERIO W/DEVICE KIT: 1.0000 | PACK | Freq: Four times a day (QID) | 0 refills | Status: AC

## 2018-12-18 MED ORDER — FENOFIBRATE 54 MG PO TABS: 54.0000 mg | ORAL_TABLET | Freq: Every day | ORAL | 1 refills | Status: DC

## 2018-12-18 MED ORDER — BASAGLAR KWIKPEN 100 UNIT/ML ~~LOC~~ SOPN: 40.0000 [IU] | PEN_INJECTOR | Freq: Every day | SUBCUTANEOUS | 1 refills | Status: DC

## 2018-12-18 MED ORDER — GLUCOSE BLOOD VI STRP: ORAL_STRIP | 12 refills | Status: DC

## 2018-12-18 MED ORDER — GLUCOPHAGE 1000 MG PO TABS: 1000.0000 mg | ORAL_TABLET | Freq: Two times a day (BID) | ORAL | 1 refills | Status: DC

## 2018-12-18 MED ORDER — DULOXETINE HCL 20 MG PO CPEP: 60.0000 mg | ORAL_CAPSULE | Freq: Every day | ORAL | 1 refills | Status: DC | PRN

## 2018-12-18 MED ORDER — BLOOD GLUCOSE METER KIT: PACK | 0 refills | Status: AC

## 2018-12-18 NOTE — Chronic Care Management (AMB) (Signed)
  Chronic Care Management    Clinical Social Work Introduction Note  12/18/2018 Name: CERITA RABELO MRN: [id number] DOB: 04/25/1961  LCSW spoke via phone with Thressa Sheller who is a 58 y.o. year old female who sees Eustaquio Maize, MD for primary care. Eustaquio Maize, MD asked LCSW to contact Ms. Theissen to provide information about CCM program services and LCSW support to address psychosocial needs of client.    Ms. Scheller was given information about Chronic Care Management services today including:  1. CCM service includes personalized support from designated clinical staff supervised by her physician, including individualized plan of care and coordination with other care providers 2. 24/7 contact phone numbers for assistance for urgent and routine care needs. 3. Service will only be billed when office clinical staff spend 20 minutes or more in a month to coordinate care. 4. Only one practitioner may furnish and bill the service in a calendar month. 5. The patient may stop CCM services at any time (effective at the end of the month) by phone call to the office staff. 6. The patient will be responsible for cost sharing (co-pay) of up to 20% of the service fee (after annual deductible is met).  Patient  did not agree to services but wishes to consider information provided before deciding about enrollment in CCM services.  Plan:   LCSW will reach out to patient by phone to follow up regarding patient decision about enrollment in CCM services.    [name].Sonam Wandel MSW, LCSW Licensed Clinical Social Worker [facility] [phone]

## 2018-12-18 NOTE — Patient Instructions (Signed)
Check blood sugars at home regularly before meals. Bring numbers to next clinic visit.

## 2018-12-18 NOTE — Patient Instructions (Signed)
Licensed Clinical Social Worker Visit Information  Ms. Bou was given information about Chronic Care Management services today including:  1. CCM service includes personalized support from designated clinical staff supervised by her physician, including individualized plan of care and coordination with other care providers 2. 24/7 contact phone numbers for assistance for urgent and routine care needs. 3. Service will only be billed when office clinical staff spend 20 minutes or more in a month to coordinate care. 4. Only one practitioner may furnish and bill the service in a calendar month. 5. The patient may stop CCM services at any time (effective at the end of the month) by phone call to the office staff. 6. The patient will be responsible for cost sharing (co-pay) of up to 20% of the service fee (after annual deductible is met).  Patient did not agree to services but is considering and agreed to LCSW follow up call in 2 weeks.   The patient verbalized understanding of instructions provided today and declined a print copy of patient instruction materials.   Plan:  LCSW will reach out to patient by phone to follow up regarding patient decision about enrollment in CCM services.    [name].Roxine Whittinghill MSW, LCSW Licensed Clinical Social Worker [facility] [phone]

## 2018-12-18 NOTE — Progress Notes (Signed)
Subjective:   Patient ID: Ashley Watts, female    DOB: 1961/11/28, 58 y.o.   MRN: [id number] CC: New Patient (Initial Visit) Follow-up multiple medical problems and dysuria  HPI: Ashley Watts is a 58 y.o. female   Diabetes: Ran out of insulin 2 months ago.  Usually takes 45 to 50 units at night.  Has been taking glimepiride and metformin.  Blood sugars have been 200s to 300s in the morning when she checks.  Usually checks 3-4 times a day.  Depression: Mood usually goes down in the fall and winter.  Struggles with relationship with her mother.  Son lives in the area, he checks on her sometimes, she says she does more checking on him probably.  Hypertension: Taking medicine regularly.  Coronary artery disease: History of NSTEMI a couple years ago.  No recent chest pain or shortness of breath  Hypothyroid: Taking medicine first thing in the morning on an empty stomach  Hyperlipidemia: Tolerating statin, no side effects  GERD: Taking Protonix regularly.  Has symptoms if she misses days.  Dysuria: Started in the last 2 days.  Every time she empties her bladder has burning pain.  No fevers.  Appetite is been okay.  Sometimes has swelling in her feet.  Takes Lasix about twice a week.  Past Medical History:  Diagnosis Date  . Anxiety   . Arthritis   . Chest pain 11/2012  . CHF (congestive heart failure) ([place])   . Depression   . Diabetes mellitus without complication ([place])   . Headache(784.0)   . Hypertension   . Hypothyroidism   . Neuromuscular disorder ([place])    DIABETIC NEUROPATHY  . Shortness of breath    Family History  Problem Relation Age of Onset  . Heart attack Father 86   Social History   Socioeconomic History  . Marital status: Divorced    Spouse name: Not on file  . Number of children: Not on file  . Years of education: Not on file  . Highest education level: Not on file  Occupational History  . Not on file  Social Needs  . Financial resource  strain: Not on file  . Food insecurity:    Worry: Not on file    Inability: Not on file  . Transportation needs:    Medical: Not on file    Non-medical: Not on file  Tobacco Use  . Smoking status: Never Smoker  . Smokeless tobacco: Never Used  Substance and Sexual Activity  . Alcohol use: No  . Drug use: No  . Sexual activity: Not Currently  Lifestyle  . Physical activity:    Days per week: Not on file    Minutes per session: Not on file  . Stress: Not on file  Relationships  . Social connections:    Talks on phone: Not on file    Gets together: Not on file    Attends religious service: Not on file    Active member of club or organization: Not on file    Attends meetings of clubs or organizations: Not on file    Relationship status: Not on file  Other Topics Concern  . Not on file  Social History Narrative  . Not on file   ROS: All systems negative other than what is in HPI  Objective:    BP 139/89   Pulse 84   Temp 98 F (36.7 C) (Oral)   Ht '5\' 5"'  (1.651 m)   Wt 207 lb 6.4  oz (94.1 kg)   BMI 34.51 kg/m   Wt Readings from Last 3 Encounters:  12/18/18 207 lb 6.4 oz (94.1 kg)  09/17/17 206 lb 3.2 oz (93.5 kg)  05/19/16 204 lb (92.5 kg)    Gen: NAD, alert, cooperative with exam, NCAT EYES: EOMI, no conjunctival injection, or no icterus ENT:   OP without erythema LYMPH: no cervical LAD CV: NRRR, normal S1/S2, no murmur, distal pulses 2+ b/l Resp: CTABL, no wheezes, normal WOB Abd: +BS, soft, NTND.  Ext: No edema, warm Neuro: Alert and oriented, strength equal b/l UE and LE, coordination grossly normal Psych: Flat affect, tearful at times.  No thoughts of self-harm.  Sometimes thinks it would be better if she were not here anymore.  Feels safe at home.   Assessment & Plan:  [name] was seen today for new patient (initial visit), follow-up multiple medical problems and dysuria.  Diagnoses and all orders for this visit:  Depression, recurrent ([place]) Ongoing  symptoms.  Duloxetine is on her medicine list but she says she has not taken it recently.  Will restart at lower dose.  Will refer for counseling.  Return precautions discussed.  Patient feels comfortable letting us know if anything gets worse.  Feels safe at home now. -     DULoxetine (CYMBALTA) 20 MG capsule; take once a day in the morning -     Referral to Chronic Care Management Services  Type 2 diabetes mellitus with other circulatory complication, with long-term current use of insulin (HCC) A1c likely to be up.  Will check today.  Check blood sugars before meals.  Follow-up in 3 to 4 weeks.  Stop glimepiride, restarting insulin. -     Insulin Glargine (BASAGLAR KWIKPEN) 100 UNIT/ML SOPN; Inject 0.4 mLs (40 Units total) into the skin at bedtime. -     Lancets (ACCU-CHEK SOFT TOUCH) lancets; Use as instructed -     glucose blood (ACCU-CHEK AVIVA) test strip; Use as instructed -     blood glucose meter kit and supplies; Dispense based on patient and insurance preference. Use up to four times daily as directed. (FOR ICD-10 E10.9, E11.9). -     Bayer DCA Hb A1c Waived -     GLUCOPHAGE 1000 MG tablet; Take 1 tablet (1,000 mg total) by mouth 2 (two) times daily with a meal.  Essential hypertension Stable, continue current medicines -     CMP14+EGFR -     lisinopril (PRINIVIL,ZESTRIL) 40 MG tablet; Take 1 tablet (40 mg total) by mouth daily. -     carvedilol (COREG) 12.5 MG tablet; Take 1 tablet (12.5 mg total) by mouth 2 (two) times daily with a meal.  Hypothyroidism, unspecified type Stable, continue current medicine -     TSH -     levothyroxine (SYNTHROID, LEVOTHROID) 150 MCG tablet; Take 1 tablet (150 mcg total) by mouth daily before breakfast.  Dysuria -     Urinalysis, Complete -     Urine Culture  Hyperlipidemia associated with type 2 diabetes mellitus (HCC) Stable, continue below -     simvastatin (ZOCOR) 10 MG tablet; Take 1 tablet (10 mg total) by mouth daily at 6 PM. -      fenofibrate 54 MG tablet; Take 1 tablet (54 mg total) by mouth daily.  Gastroesophageal reflux disease, esophagitis presence not specified Stable, continue below -     pantoprazole (PROTONIX) 40 MG tablet; Take 1 tablet (40 mg total) by mouth daily.  Other orders -  Blood Glucose Monitoring Suppl (ONETOUCH VERIO) w/Device KIT; 1 kit by Does not apply route 4 (four) times daily.   Follow up plan: Return in about 3 weeks (around 01/08/2019). Assunta Found, MD [place]

## 2018-12-19 ENCOUNTER — Other Ambulatory Visit: Payer: Self-pay | Admitting: Pediatrics

## 2018-12-19 ENCOUNTER — Other Ambulatory Visit: Payer: Self-pay | Admitting: *Deleted

## 2018-12-19 DIAGNOSIS — B379 Candidiasis, unspecified: Secondary | ICD-10-CM

## 2018-12-19 DIAGNOSIS — B49 Unspecified mycosis: Principal | ICD-10-CM

## 2018-12-19 DIAGNOSIS — E039 Hypothyroidism, unspecified: Secondary | ICD-10-CM

## 2018-12-19 LAB — CMP14+EGFR
ALK PHOS: 99 IU/L (ref 39–117)
ALT: 34 IU/L — ABNORMAL HIGH (ref 0–32)
AST: 27 IU/L (ref 0–40)
Albumin/Globulin Ratio: 1.7 (ref 1.2–2.2)
Albumin: 4.5 g/dL (ref 3.5–5.5)
BUN/Creatinine Ratio: 18 (ref 9–23)
BUN: 15 mg/dL (ref 6–24)
Bilirubin Total: 0.3 mg/dL (ref 0.0–1.2)
CHLORIDE: 97 mmol/L (ref 96–106)
CO2: 22 mmol/L (ref 20–29)
CREATININE: 0.82 mg/dL (ref 0.57–1.00)
Calcium: 10.3 mg/dL — ABNORMAL HIGH (ref 8.7–10.2)
GFR calc Af Amer: 93 mL/min/{1.73_m2} (ref >59)
GFR calc non Af Amer: 81 mL/min/{1.73_m2} (ref >59)
GLOBULIN, TOTAL: 2.6 g/dL (ref 1.5–4.5)
GLUCOSE: 234 mg/dL — AB (ref 65–99)
Potassium: 4.8 mmol/L (ref 3.5–5.2)
SODIUM: 137 mmol/L (ref 134–144)
Total Protein: 7.1 g/dL (ref 6.0–8.5)

## 2018-12-19 LAB — TSH: TSH: 0.819 u[IU]/mL (ref 0.450–4.500)

## 2018-12-19 MED ORDER — SYNTHROID 150 MCG PO TABS: 150.0000 ug | ORAL_TABLET | Freq: Every day | ORAL | 1 refills | Status: DC

## 2018-12-19 MED ORDER — FLUCONAZOLE 150 MG PO TABS: 150.0000 mg | ORAL_TABLET | ORAL | 0 refills | Status: DC | PRN

## 2018-12-21 LAB — URINE CULTURE

## 2019-01-02 ENCOUNTER — Ambulatory Visit (INDEPENDENT_AMBULATORY_CARE_PROVIDER_SITE_OTHER): Payer: Medicare Other | Admitting: Licensed Clinical Social Worker

## 2019-01-02 DIAGNOSIS — F339 Major depressive disorder, recurrent, unspecified: Secondary | ICD-10-CM | POA: Diagnosis not present

## 2019-01-02 DIAGNOSIS — I10 Essential (primary) hypertension: Secondary | ICD-10-CM

## 2019-01-02 DIAGNOSIS — Z794 Long term (current) use of insulin: Secondary | ICD-10-CM

## 2019-01-02 DIAGNOSIS — E1159 Type 2 diabetes mellitus with other circulatory complications: Secondary | ICD-10-CM

## 2019-01-02 DIAGNOSIS — E785 Hyperlipidemia, unspecified: Secondary | ICD-10-CM | POA: Diagnosis not present

## 2019-01-02 DIAGNOSIS — E1169 Type 2 diabetes mellitus with other specified complication: Secondary | ICD-10-CM | POA: Diagnosis not present

## 2019-01-02 NOTE — Chronic Care Management (AMB) (Signed)
  Chronic Care Management    Clinical Social Work General Note  01/02/2019 Name: JACYLN CARMER MRN: [id number] DOB: 07/09/1961   Referred by: PCP, Dr. Evette Doffing , for psychosocial assessment.   Ms. Humm was given information about Chronic Care Management services today including:  1. CCM service includes personalized support from designated clinical staff supervised by her physician, including individualized plan of care and coordination with other care providers 2. 24/7 contact phone numbers for assistance for urgent and routine care needs. 3. Service will only be billed when office clinical staff spend 20 minutes or more in a month to coordinate care. 4. Only one practitioner may furnish and bill the service in a calendar month. 5. The patient may stop CCM services at any time (effective at the end of the month) by phone call to the office staff. 6. The patient will be responsible for cost sharing (co-pay) of up to 20% of the service fee (after annual deductible is met).  Patient agreed to services and verbal consent obtained.   Review of patient status, including review of consultants reports, relevant laboratory and other test results, and collaboration with appropriate care team members and the patient's provider was performed as part of comprehensive patient evaluation and provision of chronic care management services.    Last CCM Appointment: 01/02/2019  Depression screen PHQ 2/9 12/18/2018  Decreased Interest 3  Down, Depressed, Hopeless 3  PHQ - 2 Score 6  Altered sleeping 2  Tired, decreased energy 3  Change in appetite 2  Feeling bad or failure about yourself  2  Trouble concentrating 3  Moving slowly or fidgety/restless 2  Suicidal thoughts 1  PHQ-9 Score 21  Difficult doing work/chores Somewhat difficult     Current Barriers:  [name] Mental Health Concerns   . Car needs repairs . Financial issues (difficulty paying monthly bills) . Decreased Social Support    Goal: Patient Stated:  "I want help in managing my depression"  Clinical Social Work Clinical Goal(s): Over the next 30 days, client will work with LCSW to address concerns related to depression issues of client  Interventions: . Client interviewed and appropriate assessments performed  . LCSW informed client about CCM program services. [name]  LCSW encouraged client to care for her pet and walk with pet outdoors as a means of relaxation.  LCSW encouraged client to listen to her favorite music as way to help her relax  LCSW encouraged client to talk with RNCM, Chong Sicilian, to discuss nursing needs of client.  Patient Self Care Activities:  . Attends all scheduled provider appointments  . Takes prescribed medications as scheduled . Cares for pet dog as means of relaxation  Plan:  . LCSW to call client in two weeks to further discuss management of depression issues of client . Client to call LCSW as needed to discuss depression issues of client. . Client to communicate as needed with RNCM Chong Sicilian, to discuss nursing needs of client.  *initial goal documentation    Follow Up Plan: LCSW to call client in 2 weeks to discuss management of depression issues of client.       [name].Perle Gibbon MSW, LCSW Licensed Clinical Social Worker [facility] [phone]

## 2019-01-02 NOTE — Patient Instructions (Signed)
Licensed Clinical Social Worker Visit Information  Materials provided: No  Ashley Watts was given information about Chronic Care Management services today including:  1. CCM service includes personalized support from designated clinical staff supervised by her physician, including individualized plan of care and coordination with other care providers 2. 24/7 contact phone numbers for assistance for urgent and routine care needs. 3. Service will only be billed when office clinical staff spend 20 minutes or more in a month to coordinate care. 4. Only one practitioner may furnish and bill the service in a calendar month. 5. The patient may stop CCM services at any time (effective at the end of the month) by phone call to the office staff. 6. The patient will be responsible for cost sharing (co-pay) of up to 20% of the service fee (after annual deductible is met).  Patient agreed to services and verbal consent obtained.    Current Barriers:   Mental Health Concerns    Car needs repairs  Financial issues (difficulty paying monthly bills)  Decreased Social Support   Goal: Patient Stated:  "I want help in managing my depression"  Clinical Social Work Clinical Goal(s): Over the next 30 days, client will work with LCSW to address concerns related to depression issues of client  Interventions:  Client interviewed and appropriate assessments performed   LCSW informed client about CCM program services.   LCSW encouraged client to care for her pet and walk with pet outdoors as a means of relaxation.  LCSW encouraged client to listen to her favorite music as way to help her relax  LCSW encouraged client to talk with RNCM, Chong Sicilian, to discuss nursing needs of client.  Patient Self Care Activities:   Attends all scheduled provider appointments   Takes prescribed medications as scheduled  Cares for pet dog as means of relaxation  Plan:   LCSW to call client in two weeks to  further discuss management of depression issues of client  Client to call LCSW as needed to discuss depression issues of client.  Client to communicate as needed with RNCM Chong Sicilian, to discuss nursing needs of client.  *initial goal documentation    Follow Up Plan: LCSW to call client in 2 weeks to discuss management of depression issues of client.     The patient verbalized understanding of instructions provided today and declined a print copy of patient instruction materials.   [name].[name] MSW, LCSW Licensed Clinical Social Worker [facility] [phone]

## 2019-01-03 ENCOUNTER — Telehealth: Payer: Self-pay | Admitting: Family Medicine

## 2019-01-03 NOTE — Chronic Care Management (AMB) (Addendum)
  Chronic Care Management    Clinical Social Work General Note  Note addended 01/03/2019 to reflect a change in PCP/enrolling provider due to Dr Arbie Cookey Vincent's current medical leave of absence and her pending resignation from [facility] [employer] once the medical leave has ended.   01/02/2019 Name: Ashley Watts MRN: [id number] DOB: 1961/11/11   Referred by: PCP, Dr. Evette Doffing , for psychosocial assessment.   [name] was given information about Chronic Care Management services today including:  1. CCM service includes personalized support from designated clinical staff supervised by her physician, including individualized plan of care and coordination with other care providers 2. 24/7 contact phone numbers for assistance for urgent and routine care needs. 3. Service will only be billed when office clinical staff spend 20 minutes or more in a month to coordinate care. 4. Only one practitioner may furnish and bill the service in a calendar month. 5. The patient may stop CCM services at any time (effective at the end of the month) by phone call to the office staff. 6. The patient will be responsible for cost sharing (co-pay) of up to 20% of the service fee (after annual deductible is met).  Patient agreed to services and verbal consent obtained.   Review of patient status, including review of consultants reports, relevant laboratory and other test results, and collaboration with appropriate care team members and the patient's provider was performed as part of comprehensive patient evaluation and provision of chronic care management services.    Last CCM Appointment: 01/02/2019  Depression screen PHQ 2/9 12/18/2018  Decreased Interest 3  Down, Depressed, Hopeless 3  PHQ - 2 Score 6  Altered sleeping 2  Tired, decreased energy 3  Change in appetite 2  Feeling bad or failure about yourself  2  Trouble concentrating 3  Moving slowly or fidgety/restless 2  Suicidal thoughts 1  PHQ-9 Score 21    Difficult doing work/chores Somewhat difficult     Current Barriers:  [name] Mental Health Concerns   . Car needs repairs . Financial issues (difficulty paying monthly bills) . Decreased Social Support   Goal: Patient Stated:  "I want help in managing my depression"  Clinical Social Work Clinical Goal(s): Over the next 30 days, client will work with LCSW to address concerns related to depression issues of client  Interventions: . Client interviewed and appropriate assessments performed  . LCSW informed client about CCM program services. [name]  LCSW encouraged client to care for her pet and walk with pet outdoors as a means of relaxation.  LCSW encouraged client to listen to her favorite music as way to help her relax  LCSW encouraged client to talk with RNCM, Chong Sicilian, to discuss nursing needs of client.  Patient Self Care Activities:  . Attends all scheduled provider appointments  . Takes prescribed medications as scheduled . Cares for pet dog as means of relaxation  Plan:  . LCSW to call client in two weeks to further discuss management of depression issues of client . Client to call LCSW as needed to discuss depression issues of client. . Client to communicate as needed with RNCM Chong Sicilian, to discuss nursing needs of client.  *initial goal documentation    Follow Up Plan: LCSW to call client in 2 weeks to discuss management of depression issues of client.       [name].Forrest MSW, LCSW Licensed Clinical Social Worker [facility] [phone]

## 2019-01-03 NOTE — Chronic Care Management (AMB) (Addendum)
  Chronic Care Management    Clinical Social Work Introduction Note  Note addended 01/03/2019 to reflect a change in PCP/enrolling provider due to Dr Arbie Cookey Vincent's current medical leave of absence and her pending resignation from [facility] once the medical leave has ended.   12/18/2018 Name: Ashley Watts MRN: [id number] DOB: 19-Aug-1961  LCSW spoke via phone with Ashley Watts who is a 58 y.o. year old female who sees Ashley Watts, Ashley Burkitt, [name] for primary care. Ashley Gouty, [name] asked LCSW to contact Ms. Alber to provide information about CCM program services and LCSW support to address psychosocial needs of client.    Ashley Watts was given information about Chronic Care Management services today including:  1. CCM service includes personalized support from designated clinical staff supervised by her physician, including individualized plan of care and coordination with other care providers 2. 24/7 contact phone numbers for assistance for urgent and routine care needs. 3. Service will only be billed when office clinical staff spend 20 minutes or more in a month to coordinate care. 4. Only one practitioner may furnish and bill the service in a calendar month. 5. The patient may stop CCM services at any time (effective at the end of the month) by phone call to the office staff. 6. The patient will be responsible for cost sharing (co-pay) of up to 20% of the service fee (after annual deductible is met).  Patient  did not agree to services but wishes to consider information provided before deciding about enrollment in CCM services.  Plan:   LCSW will reach out to patient by phone to follow up regarding patient decision about enrollment in CCM services.    [name].Forrest MSW, LCSW Licensed Clinical Social Worker [facility] [phone]

## 2019-01-08 ENCOUNTER — Encounter: Payer: Self-pay | Admitting: Family Medicine

## 2019-01-08 ENCOUNTER — Ambulatory Visit (INDEPENDENT_AMBULATORY_CARE_PROVIDER_SITE_OTHER): Payer: Medicare Other | Admitting: Family Medicine

## 2019-01-08 VITALS — BP 169/83 | HR 80 | Temp 97.4°F | Ht 65.0 in | Wt 207.0 lb

## 2019-01-08 DIAGNOSIS — H66001 Acute suppurative otitis media without spontaneous rupture of ear drum, right ear: Secondary | ICD-10-CM

## 2019-01-08 DIAGNOSIS — J0100 Acute maxillary sinusitis, unspecified: Secondary | ICD-10-CM

## 2019-01-08 MED ORDER — FLUTICASONE PROPIONATE 50 MCG/ACT NA SUSP: 2.0000 | Freq: Every day | NASAL | 6 refills | Status: DC

## 2019-01-08 MED ORDER — AMOXICILLIN-POT CLAVULANATE 875-125 MG PO TABS: 1.0000 | ORAL_TABLET | Freq: Two times a day (BID) | ORAL | 0 refills | Status: AC

## 2019-01-08 NOTE — Patient Instructions (Signed)

## 2019-01-08 NOTE — Progress Notes (Signed)
    Subjective:     Ashley Watts is a 58 y.o. female who presents for evaluation of sinus pain. Symptoms include: congestion, cough, facial pain, fevers, headaches, nasal congestion, post nasal drip, purulent rhinorrhea, sinus pressure, sore throat and right otalgia. Onset of symptoms was 7 days ago. Symptoms have been gradually worsening since that time. Past history is significant for no history of pneumonia or bronchitis. Patient is a non-smoker.  The following portions of the patient's history were reviewed and updated as appropriate: allergies, current medications, past family history, past medical history, past social history, past surgical history and problem list.  Review of Systems Constitutional: positive for chills, fatigue and fevers Eyes: negative Ears, nose, mouth, throat, and face: positive for earaches, nasal congestion, sore throat and sinus pressure Respiratory: positive for cough Cardiovascular: negative Gastrointestinal: negative Musculoskeletal:positive for myalgias Neurological: positive for headaches   Objective:    BP (!) 169/83   Pulse 80   Temp (!) 97.4 F (36.3 C) (Oral)   Ht 5\' 5"  (1.651 m)   Wt 207 lb (93.9 kg)   BMI 34.45 kg/m  General appearance: alert, cooperative and mild distress Head: Normocephalic, without obvious abnormality, atraumatic Eyes: negative Ears: normal TM and external ear canal left ear and abnormal TM right ear - erythematous and bulging Nose: scant and yellow discharge, moderate congestion, turbinates red, swollen, moderate maxillary sinus tenderness bilateral, no frontal sinus tenderness bilateral Throat: abnormal findings: mild oropharyngeal erythema Neck: no adenopathy, no carotid bruit, no JVD, supple, symmetrical, trachea midline and thyroid not enlarged, symmetric, no tenderness/mass/nodules Lungs: clear to auscultation bilaterally Heart: regular rate and rhythm, S1, S2 normal, no murmur, click, rub or gallop Skin:  Skin color, texture, turgor normal. No rashes or lesions Neurologic: Grossly normal    Assessment:  Ashley Watts was seen today for right ear pain, sinus congesetion, headache.  Diagnoses and all orders for this visit:  Acute non-recurrent maxillary sinusitis -     amoxicillin-clavulanate (AUGMENTIN) 875-125 MG tablet; Take 1 tablet by mouth 2 (two) times daily for 10 days. -     fluticasone (FLONASE) 50 MCG/ACT nasal spray; Place 2 sprays into both nostrils daily.  Non-recurrent acute suppurative otitis media of right ear without spontaneous rupture of tympanic membrane -     amoxicillin-clavulanate (AUGMENTIN) 875-125 MG tablet; Take 1 tablet by mouth 2 (two) times daily for 10 days.  Symptomatic care discussed. Medications as prescribed. Report any new or worsening symptoms.    Plan:    Nasal saline sprays. Nasal steroids per medication orders. Augmentin per medication orders.   Return in about 2 weeks (around 01/22/2019), or if symptoms worsen or fail to improve.     The above assessment and management plan was discussed with the patient. The patient verbalized understanding of and has agreed to the management plan. Patient is aware to call the clinic if symptoms fail to improve or worsen. Patient is aware when to return to the clinic for a follow-up visit. Patient educated on when it is appropriate to go to the emergency department.   Kari Baars, [name]-C [facility] [address] [place], [place] [address] [phone]

## 2019-01-14 ENCOUNTER — Other Ambulatory Visit: Payer: Self-pay | Admitting: Family Medicine

## 2019-01-14 ENCOUNTER — Telehealth: Payer: Self-pay | Admitting: Family Medicine

## 2019-01-14 DIAGNOSIS — Z8619 Personal history of other infectious and parasitic diseases: Secondary | ICD-10-CM

## 2019-01-14 MED ORDER — FLUCONAZOLE 150 MG PO TABS: 150.0000 mg | ORAL_TABLET | Freq: Once | ORAL | 0 refills | Status: AC

## 2019-01-14 NOTE — Telephone Encounter (Signed)
What symptoms do you have? Has a yeast infection from antibotics. Wants 2 diflucan  How long have you been sick? One week  Have you been seen for this problem? Last week  If your provider decides to give you a prescription, which pharmacy would you like for it to be sent to? [facility]   Patient informed that this information will be sent to the clinical staff for review and that they should receive a follow up call.

## 2019-01-14 NOTE — Telephone Encounter (Signed)
Sent to [facility] 

## 2019-01-14 NOTE — Telephone Encounter (Signed)
Patient aware.

## 2019-01-18 ENCOUNTER — Ambulatory Visit: Payer: Self-pay | Admitting: Licensed Clinical Social Worker

## 2019-01-18 DIAGNOSIS — F339 Major depressive disorder, recurrent, unspecified: Secondary | ICD-10-CM

## 2019-01-18 DIAGNOSIS — I10 Essential (primary) hypertension: Secondary | ICD-10-CM

## 2019-01-18 DIAGNOSIS — Z794 Long term (current) use of insulin: Secondary | ICD-10-CM

## 2019-01-18 DIAGNOSIS — E785 Hyperlipidemia, unspecified: Secondary | ICD-10-CM

## 2019-01-18 DIAGNOSIS — E1159 Type 2 diabetes mellitus with other circulatory complications: Secondary | ICD-10-CM

## 2019-01-18 DIAGNOSIS — E1169 Type 2 diabetes mellitus with other specified complication: Secondary | ICD-10-CM

## 2019-01-18 NOTE — Chronic Care Management (AMB) (Signed)
  Chronic Care Management    Clinical Social Work General Follow Up Note  01/18/2019 Name: Ashley Watts MRN: [id number] DOB: Sep 28, 1961  Referred by: PCP, Ashley Masters, [name] for psychosocial assessment  Review of patient status, including review of consultants reports, relevant laboratory and other test results, and collaboration with appropriate care team members and the patient's provider was performed as part of comprehensive patient evaluation and provision of chronic care management services.    Last CCM Appointment: 01/18/2019  Depression screen PHQ 2/9 01/08/2019  Decreased Interest 3  Down, Depressed, Hopeless 2  PHQ - 2 Score 5  Altered sleeping 0  Tired, decreased energy 1  Change in appetite 0  Feeling bad or failure about yourself  1  Trouble concentrating 0  Moving slowly or fidgety/restless 0  Suicidal thoughts 0  PHQ-9 Score 7  Difficult doing work/chores -     Current Barriers:   Mental Health Concerns    Car needs repairs  Financial issues (difficulty paying monthly bills)  Decreased Social Support   Goal: Patient Stated:  "I want help in managing my depression"  Clinical Social Work Clinical Goal(s): Over the next 30 days, client will work with LCSW to address concerns related to depression issues of client  Interventions:  LCSW informed client about CCM program services.   LCSW encouraged client to care for her pet and walk with pet outdoors as a means of relaxation.  LCSW encouraged client to listen to her favorite music as way to help her relax  LCSW encouraged client to talk with RNCM, Ashley Watts, to discuss nursing needs of client.  Patient Self Care Activities:   Attends all scheduled provider appointments   Takes prescribed medications as scheduled  Cares for pet dog as means of relaxation  Plan:   LCSW to call client in three weeks to further discuss management of depression issues of client  Client to call LCSW as  needed to discuss depression issues of client.  Client to communicate as needed with RNCM Ashley Watts, to discuss nursing needs of client.  Client to use relaxation techniques of choice to help her manage depression issues faced by client  *initial goal documentation    Follow Up Plan: LCSW to call client in 3 weeks to discuss management of depression issues of client.       Ashley Watts.Ashley Watts MSW, LCSW Licensed Clinical Social Worker [facility] Care [facility] [phone]

## 2019-01-18 NOTE — Patient Instructions (Addendum)
Licensed Clinical Actuary Provided:  No  Goals we discussed today:   Current Barriers:  Mental Health Concerns  Car needs repairs  Financial issues (difficulty paying monthly bills)  Decreased Social Support  Goal:Patient Stated: "I want help in managing my depression"  Clinical Social Work Clinical Goal(s):Over the next30days, client will work with LCSW to address concerns related to depression issues of client  Interventions:  LCSW informed client about CCM program services.  LCSW encouraged client to care for her pet and walk with pet outdoors as a means of relaxation.  LCSW encouraged client to listen to her favorite music as way to help her relax  LCSW encouraged client to talk with RNCM, Demetrios Loll, to discuss nursing needs of client.  Patient Self Care Activities:  Attends all scheduled provider appointments  Takes prescribed medications as scheduled  Cares for pet dog as means of relaxation  Plan:  LCSW to call client in three weeks to further discuss management of depression issues of client  Client to call LCSW as needed to discuss depression issues of client.  Client to communicate as needed with RNCM Demetrios Loll, to discuss nursing needs of client.  Client to use relaxation techniques of choice to help her manage depression issues faced by client  *initial goal documentation  Follow Up Plan:LCSW to call client in 3 weeks to discuss managementof depression issues of client.  The patient verbalized understanding of instructions provided today and declined a print copy of patient instruction materials.   [name].Danyela Posas MSW, LCSW Licensed Clinical Social Worker [facility] [phone]

## 2019-01-21 ENCOUNTER — Ambulatory Visit (INDEPENDENT_AMBULATORY_CARE_PROVIDER_SITE_OTHER): Payer: Medicare Other | Admitting: Licensed Clinical Social Worker

## 2019-01-21 DIAGNOSIS — E1169 Type 2 diabetes mellitus with other specified complication: Secondary | ICD-10-CM | POA: Diagnosis not present

## 2019-01-21 DIAGNOSIS — E1159 Type 2 diabetes mellitus with other circulatory complications: Secondary | ICD-10-CM | POA: Diagnosis not present

## 2019-01-21 DIAGNOSIS — I10 Essential (primary) hypertension: Secondary | ICD-10-CM | POA: Diagnosis not present

## 2019-01-21 DIAGNOSIS — E785 Hyperlipidemia, unspecified: Secondary | ICD-10-CM | POA: Diagnosis not present

## 2019-01-21 DIAGNOSIS — F339 Major depressive disorder, recurrent, unspecified: Secondary | ICD-10-CM | POA: Diagnosis not present

## 2019-01-21 DIAGNOSIS — Z794 Long term (current) use of insulin: Secondary | ICD-10-CM

## 2019-01-21 NOTE — Patient Instructions (Addendum)
Licensed Clinical Actuary Provided: No  Goals we discussed today:    Current Barriers:  Mental Health Concerns  Car needs repairs  Financial issues (difficulty paying monthly bills)  Decreased Social Support  Goal:Patient Stated: "I want help in managing my depression"  Clinical Social Work Clinical Goal(s):Over the next30days, client will work with LCSW to address concerns related to depression issues of client  Interventions:  LCSW reminded client about CCM program services.   LCSW encouraged client to talk with RNCM, Demetrios Loll, to discuss nursing needs of client.  LCSW talked with client about family stress issues for client  LCSW provided counseling support for client    Patient Self Care Activities:  Attends all scheduled provider appointments  Takes prescribed medications as scheduled  Cares for pet dog as means of relaxation  Plan:  LCSW to call client inthreeweeks to further discuss management of depression issues of client  Client to call LCSW as needed to discuss depression issues of client.  Client to communicate as needed with RNCM Demetrios Loll, to discuss nursing needs of client.  Client to use relaxation techniques of choice to help her manage depression issues faced by client  *initial goal documentation  Follow Up Plan:LCSW to call client in3weeks to discuss managementof depression issues of client.     The patient verbalized understanding of instructions provided today and declined a print copy of patient instruction materials.   [name].Griffin Dewilde MSW, LCSW Licensed Clinical Social Worker [facility] [phone]

## 2019-01-21 NOTE — Chronic Care Management (AMB) (Signed)
  Chronic Care Management    Clinical Social Work General Follow Up Note  01/21/2019 Name: Ashley Watts MRN: [id number] DOB: 11-Aug-1961  Referred by: PCP, [name], [name] for psychosocial assessment and counseling support  Review of patient status, including review of consultants reports, relevant laboratory and other test results, and collaboration with appropriate care team members and the patient's provider was performed as part of comprehensive patient evaluation and provision of chronic care management services.    Last CCM Appointment: 01/21/2019  Depression screen PHQ 2/9 01/08/2019  Decreased Interest 3  Down, Depressed, Hopeless 2  PHQ - 2 Score 5  Altered sleeping 0  Tired, decreased energy 1  Change in appetite 0  Feeling bad or failure about yourself  1  Trouble concentrating 0  Moving slowly or fidgety/restless 0  Suicidal thoughts 0  PHQ-9 Score 7  Difficult doing work/chores -      Current Barriers:  Mental Health Concerns  Car needs repairs  Financial issues (difficulty paying monthly bills)  Decreased Social Support  Goal:Patient Stated: "I want help in managing my depression"  Clinical Social Work Clinical Goal(s):Over the next30days, client will work with LCSW to address concerns related to depression issues of client  Interventions:  LCSW reminded client about CCM program services.   LCSW encouraged client to talk with RNCM, Demetrios Loll, to discuss nursing needs of client.  LCSW talked with client about family stress issues for client  LCSW provided counseling support for client    Patient Self Care Activities:  Attends all scheduled provider appointments  Takes prescribed medications as scheduled  Cares for pet dog as means of relaxation  Plan:  LCSW to call client in three weeks to further discuss management of depression issues of client  Client to call LCSW as needed to discuss depression issues of  client.  Client to communicate as needed with RNCM Demetrios Loll, to discuss nursing needs of client.  Client to use relaxation techniques of choice to help her manage depression issues faced by client  *initial goal documentation  Follow Up Plan:LCSW to call client in 3 weeks to discuss managementof depression issues of client.  [name].Salam Micucci MSW, LCSW Licensed Clinical Social Worker [facility] [phone]

## 2019-01-22 ENCOUNTER — Ambulatory Visit (INDEPENDENT_AMBULATORY_CARE_PROVIDER_SITE_OTHER): Payer: Medicare Other | Admitting: Family Medicine

## 2019-01-22 ENCOUNTER — Encounter: Payer: Self-pay | Admitting: Family Medicine

## 2019-01-22 VITALS — BP 153/86 | HR 77 | Temp 98.3°F | Ht 65.0 in | Wt 207.0 lb

## 2019-01-22 DIAGNOSIS — S40921A Unspecified superficial injury of right upper arm, initial encounter: Secondary | ICD-10-CM

## 2019-01-22 DIAGNOSIS — Z8669 Personal history of other diseases of the nervous system and sense organs: Secondary | ICD-10-CM | POA: Diagnosis not present

## 2019-01-22 DIAGNOSIS — T148XXA Other injury of unspecified body region, initial encounter: Secondary | ICD-10-CM

## 2019-01-22 DIAGNOSIS — Z09 Encounter for follow-up examination after completed treatment for conditions other than malignant neoplasm: Secondary | ICD-10-CM | POA: Diagnosis not present

## 2019-01-22 NOTE — Progress Notes (Signed)
Subjective:  Patient ID: Ashley Watts, female    DOB: 10-Feb-1961, 58 y.o.   MRN: [id number]  Chief Complaint:  Follow up ear infection and Knot on right upper arm   HPI: [name] is a 58 y.o. female presenting on 01/22/2019 for Follow up ear infection and Knot on right upper arm  Pt presents today for otitis media follow up. Pt has been taking antibiotics as prescribed. Pt states she is no longer symptomatic. States she has a few antibiotics left. Pt states she also has a a knot on her right upper arm. States the knot has been there for several weeks and is painful to touch.   Relevant past medical, surgical, family, and social history reviewed and updated as indicated.  Allergies and medications reviewed and updated.   Past Medical History:  Diagnosis Date  . Anxiety   . Arthritis   . Chest pain 11/2012  . CHF (congestive heart failure) ([place])   . Depression   . Diabetes mellitus without complication ([place])   . Headache(784.0)   . Hypertension   . Hypothyroidism   . Neuromuscular disorder ([place])    DIABETIC NEUROPATHY  . Shortness of breath     Past Surgical History:  Procedure Laterality Date  . ABDOMINAL HYSTERECTOMY    . LEFT AND RIGHT HEART CATHETERIZATION WITH CORONARY ANGIOGRAM N/A 11/20/2012   Procedure: LEFT AND RIGHT HEART CATHETERIZATION WITH CORONARY ANGIOGRAM;  Surgeon: Peter M Martinique, MD;  Location: [facility] CATH LAB;  Service: Cardiovascular;  Laterality: N/A;    Social History   Socioeconomic History  . Marital status: Divorced    Spouse name: Not on file  . Number of children: Not on file  . Years of education: Not on file  . Highest education level: Not on file  Occupational History  . Not on file  Social Needs  . Financial resource strain: Not on file  . Food insecurity:    Worry: Not on file    Inability: Not on file  . Transportation needs:    Medical: Not on file    Non-medical: Not on file  Tobacco Use  . Smoking status: Never  Smoker  . Smokeless tobacco: Never Used  Substance and Sexual Activity  . Alcohol use: No  . Drug use: No  . Sexual activity: Not Currently  Lifestyle  . Physical activity:    Days per week: Not on file    Minutes per session: Not on file  . Stress: Not on file  Relationships  . Social connections:    Talks on phone: Not on file    Gets together: Not on file    Attends religious service: Not on file    Active member of club or organization: Not on file    Attends meetings of clubs or organizations: Not on file    Relationship status: Not on file  . Intimate partner violence:    Fear of current or ex partner: Not on file    Emotionally abused: Not on file    Physically abused: Not on file    Forced sexual activity: Not on file  Other Topics Concern  . Not on file  Social History Narrative  . Not on file    Outpatient Encounter Medications as of 01/22/2019  Medication Sig  . aspirin EC 81 MG EC tablet Take 1 tablet (81 mg total) by mouth daily.  . blood glucose meter kit and supplies Dispense based on patient and insurance  preference. Use up to four times daily as directed. (FOR ICD-10 E10.9, E11.9).  [name] Blood Glucose Monitoring Suppl (ONETOUCH VERIO) w/Device KIT 1 kit by Does not apply route 4 (four) times daily.  . carvedilol (COREG) 12.5 MG tablet Take 1 tablet (12.5 mg total) by mouth 2 (two) times daily with a meal.  . DULoxetine (CYMBALTA) 20 MG capsule Take 1 capsule (20 mg total) by mouth daily.  . fenofibrate 54 MG tablet Take 1 tablet (54 mg total) by mouth daily.  . fluticasone (FLONASE) 50 MCG/ACT nasal spray Place 2 sprays into both nostrils daily.  . furosemide (LASIX) 40 MG tablet Take 1 tablet (40 mg total) by mouth daily. (Patient taking differently: Take 40 mg by mouth daily as needed for fluid. )  . GLUCOPHAGE 1000 MG tablet Take 1 tablet (1,000 mg total) by mouth 2 (two) times daily with a meal.  . glucose blood (ACCU-CHEK AVIVA) test strip Use as instructed    . Insulin Glargine (BASAGLAR KWIKPEN) 100 UNIT/ML SOPN Inject 0.4 mLs (40 Units total) into the skin at bedtime.  . Lancets (ACCU-CHEK SOFT TOUCH) lancets Use as instructed  . lisinopril (PRINIVIL,ZESTRIL) 40 MG tablet Take 1 tablet (40 mg total) by mouth daily.  . pantoprazole (PROTONIX) 40 MG tablet Take 1 tablet (40 mg total) by mouth daily.  . simvastatin (ZOCOR) 10 MG tablet Take 1 tablet (10 mg total) by mouth daily at 6 PM.  . SYNTHROID 150 MCG tablet Take 1 tablet (150 mcg total) by mouth daily before breakfast.   No facility-administered encounter medications on file as of 01/22/2019.     Allergies  Allergen Reactions  . Codeine Itching  . Morphine And Related Itching  . Hydrocodone Itching and Rash    Review of Systems  Constitutional: Negative for chills, fatigue and fever.  HENT: Positive for postnasal drip (minimal) and rhinorrhea (minimal). Negative for congestion and ear pain.   Respiratory: Negative for cough and shortness of breath.   Cardiovascular: Negative for chest pain, palpitations and leg swelling.  Musculoskeletal: Negative for arthralgias and myalgias.  Skin: Negative for wound.       Knot to right upper arm  Neurological: Negative for dizziness, tremors and headaches.  Psychiatric/Behavioral: Negative for confusion.  All other systems reviewed and are negative.       Objective:  BP (!) 153/86   Pulse 77   Temp 98.3 F (36.8 C) (Oral)   Ht '5\' 5"'  (1.651 m)   Wt 207 lb (93.9 kg)   BMI 34.45 kg/m    Wt Readings from Last 3 Encounters:  01/22/19 207 lb (93.9 kg)  01/08/19 207 lb (93.9 kg)  12/18/18 207 lb 6.4 oz (94.1 kg)    Physical Exam Vitals signs and nursing note reviewed.  Constitutional:      Appearance: Normal appearance.  HENT:     Head: Normocephalic and atraumatic.     Right Ear: Tympanic membrane, ear canal and external ear normal.     Left Ear: Tympanic membrane, ear canal and external ear normal.     Nose: Nose normal. No  congestion or rhinorrhea.     Mouth/Throat:     Mouth: Mucous membranes are moist.     Pharynx: Oropharynx is clear. No oropharyngeal exudate or posterior oropharyngeal erythema.  Eyes:     Extraocular Movements: Extraocular movements intact.     Conjunctiva/sclera: Conjunctivae normal.     Pupils: Pupils are equal, round, and reactive to light.  Cardiovascular:  Rate and Rhythm: Normal rate and regular rhythm.     Heart sounds: Normal heart sounds. No murmur. No friction rub. No gallop.   Pulmonary:     Effort: Pulmonary effort is normal. No respiratory distress.     Breath sounds: Normal breath sounds.  Musculoskeletal: Normal range of motion.  Skin:    General: Skin is warm and dry.     Capillary Refill: Capillary refill takes less than 2 seconds.       Neurological:     General: No focal deficit present.     Mental Status: She is alert and oriented to person, place, and time.  Psychiatric:        Mood and Affect: Mood normal.        Behavior: Behavior normal.        Thought Content: Thought content normal.        Judgment: Judgment normal.     Results for orders placed or performed in visit on 12/18/18  Urine Culture  Result Value Ref Range   Urine Culture, Routine Final report    Organism ID, Bacteria Comment   Microscopic Examination  Result Value Ref Range   WBC, UA 0-5 0 - 5 /hpf   RBC, UA 0-2 0 - 2 /hpf   Epithelial Cells (non renal) 0-10 0 - 10 /hpf   Renal Epithel, UA None seen None seen /hpf   Bacteria, UA Few (A) None seen/Few   Yeast, UA Present None seen  CMP14+EGFR  Result Value Ref Range   Glucose 234 (H) 65 - 99 mg/dL   BUN 15 6 - 24 mg/dL   Creatinine, Ser 0.82 0.57 - 1.00 mg/dL   GFR calc non Af Amer 81 >59 mL/min/1.73   GFR calc Af Amer 93 >59 mL/min/1.73   BUN/Creatinine Ratio 18 9 - 23   Sodium 137 134 - 144 mmol/L   Potassium 4.8 3.5 - 5.2 mmol/L   Chloride 97 96 - 106 mmol/L   CO2 22 20 - 29 mmol/L   Calcium 10.3 (H) 8.7 - 10.2 mg/dL    Total Protein 7.1 6.0 - 8.5 g/dL   Albumin 4.5 3.5 - 5.5 g/dL   Globulin, Total 2.6 1.5 - 4.5 g/dL   Albumin/Globulin Ratio 1.7 1.2 - 2.2   Bilirubin Total 0.3 0.0 - 1.2 mg/dL   Alkaline Phosphatase 99 39 - 117 IU/L   AST 27 0 - 40 IU/L   ALT 34 (H) 0 - 32 IU/L  TSH  Result Value Ref Range   TSH 0.819 0.450 - 4.500 uIU/mL  Bayer DCA Hb A1c Waived  Result Value Ref Range   HB A1C (BAYER DCA - WAIVED) 10.5 (H) <7.0 %  Urinalysis, Complete  Result Value Ref Range   Specific Gravity, UA 1.015 1.005 - 1.030   pH, UA 5.5 5.0 - 7.5   Color, UA Yellow Yellow   Appearance Ur Clear Clear   Leukocytes, UA Negative Negative   Protein, UA Negative Negative/Trace   Glucose, UA 3+ (A) Negative   Ketones, UA Trace (A) Negative   RBC, UA Negative Negative   Bilirubin, UA Negative Negative   Urobilinogen, Ur 0.2 0.2 - 1.0 mg/dL   Nitrite, UA Negative Negative   Microscopic Examination See below:        Pertinent labs & imaging results that were available during my care of the patient were reviewed by me and considered in my medical decision making.  Assessment & Plan:  Abbygael was seen  today for follow up ear infection and knot on right upper arm.  Diagnoses and all orders for this visit:  Hematoma Symptomatic care discussed. Report any new or worsening symptoms.   Otitis media follow-up, infection resolved Resolved. Complete current course of antibiotics.     Continue all other maintenance medications.  Follow up plan: Return if symptoms worsen or fail to improve, for CPE.  Educational handout given for hematoma  The above assessment and management plan was discussed with the patient. The patient verbalized understanding of and has agreed to the management plan. Patient is aware to call the clinic if symptoms persist or worsen. Patient is aware when to return to the clinic for a follow-up visit. Patient educated on when it is appropriate to go to the emergency department.    Monia Pouch, [name]-C [facility] [phone]

## 2019-01-22 NOTE — Patient Instructions (Signed)
Hematoma  A hematoma is a collection of blood. A hematoma can happen:  · Under the skin.  · In an organ.  · In a body space.  · In a joint space.  · In other tissues.  The blood can thicken (clot) to form a lump that you can see and feel. The lump is often hard and may become sore and tender. The lump can be very small or very big. Most hematomas get better in a few days to weeks. However, some hematomas may be serious and need medical care.  What are the causes?  This condition is caused by:  · An injury.  · Blood that leaks under the skin.  · Problems from surgeries.  · Medical conditions that cause bleeding or bruising.  What increases the risk?  You are more likely to develop this condition if:  · You are an older adult.  · You use medicines that thin your blood.  What are the signs or symptoms?  Symptoms depend on where the hematoma is in your body.  · If the hematoma is under the skin, there is:  ? A firm lump on the body.  ? Pain and tenderness in the area.  ? Bruising. The skin above the lump may be blue, dark blue, purple-red, or yellowish.  · If the hematoma is deep in the tissues or body spaces, there may be:  ? Blood in the stomach. This may cause pain in the belly (abdomen), weakness, passing out (fainting), and shortness of breath.  ? Blood in the head. This may cause a headache, weakness, trouble speaking or understanding speech, or passing out.  How is this diagnosed?  This condition is diagnosed based on:  · Your medical history.  · A physical exam.  · Imaging tests, such as ultrasound or CT scan.  · Blood tests.  How is this treated?  Treatment depends on the cause, size, and location of the hematoma. Treatment may include:  · Doing nothing. Many hematomas go away on their own without treatment.  · Surgery or close monitoring. This may be needed for large hematomas or hematomas that affect the body's organs.  · Medicines. These may be given if a medical condition caused the hematoma.  Follow these  instructions at home:  Managing pain, stiffness, and swelling    · If told, put ice on the area.  ? Put ice in a plastic bag.  ? Place a towel between your skin and the bag.  ? Leave the ice on for 20 minutes, 2-3 times a day for the first two days.  · If told, put heat on the affected area after putting ice on the area for two days. Use the heat source that your doctor tells you to use. This could be a moist heat pack or a heating pad. To do this:  ? Place a towel between your skin and the heat source.  ? Leave the heat on for 20-30 minutes.  ? Remove the heat if your skin turns bright red. This is very important if you are unable to feel pain, heat, or cold. You may have a greater risk of getting burned.  · Raise (elevate) the affected area above the level of your heart while you are sitting or lying down.  · Wrap the affected area with an elastic bandage, if told by your doctor. Do not wrap the bandage too tightly.  · If your hematoma is on a leg or foot and   is painful, your doctor may give you crutches. Use them as told by your doctor.  General instructions  · Take over-the-counter and prescription medicines only as told by your doctor.  · Keep all follow-up visits as told by your doctor. This is important.  Contact a doctor if:  · You have a fever.  · The swelling or bruising gets worse.  · You start to get more hematomas.  Get help right away if:  · Your pain gets worse.  · Your pain is not getting better with medicine.  · Your skin over the hematoma breaks or starts to bleed.  · Your hematoma is in your chest or belly and you:  ? Pass out.  ? Feel weak.  ? Become short of breath.  · You have a hematoma on your scalp that is caused by a fall or injury, and you:  ? Have a headache that gets worse.  ? Have trouble speaking or understanding speech.  ? Become less alert or you pass out.  Summary  · A hematoma is a collection of blood in any part of your body.  · Most hematomas get better on their own in a few days  to weeks. Some may need medical care.  · Follow instructions from your doctor about how to care for your hematoma.  · Contact a doctor if the swelling or bruising gets worse, or if you are short of breath.  This information is not intended to replace advice given to you by your health care provider. Make sure you discuss any questions you have with your health care provider.  Document Released: 12/29/2004 Document Revised: 04/26/2018 Document Reviewed: 04/26/2018  Elsevier Interactive Patient Education © 2019 [employer]

## 2019-01-25 ENCOUNTER — Ambulatory Visit: Payer: Medicare Other | Admitting: Pediatrics

## 2019-01-28 ENCOUNTER — Ambulatory Visit: Payer: Medicare Other | Admitting: Family Medicine

## 2019-02-05 DIAGNOSIS — E113393 Type 2 diabetes mellitus with moderate nonproliferative diabetic retinopathy without macular edema, bilateral: Secondary | ICD-10-CM | POA: Diagnosis not present

## 2019-02-05 DIAGNOSIS — Z7984 Long term (current) use of oral hypoglycemic drugs: Secondary | ICD-10-CM | POA: Diagnosis not present

## 2019-02-05 DIAGNOSIS — H43813 Vitreous degeneration, bilateral: Secondary | ICD-10-CM | POA: Diagnosis not present

## 2019-02-05 DIAGNOSIS — Z794 Long term (current) use of insulin: Secondary | ICD-10-CM | POA: Diagnosis not present

## 2019-02-05 LAB — HM DIABETES EYE EXAM

## 2019-02-07 ENCOUNTER — Ambulatory Visit (INDEPENDENT_AMBULATORY_CARE_PROVIDER_SITE_OTHER): Payer: Medicare Other | Admitting: Licensed Clinical Social Worker

## 2019-02-07 DIAGNOSIS — I10 Essential (primary) hypertension: Secondary | ICD-10-CM

## 2019-02-07 DIAGNOSIS — E1169 Type 2 diabetes mellitus with other specified complication: Secondary | ICD-10-CM

## 2019-02-07 DIAGNOSIS — E785 Hyperlipidemia, unspecified: Secondary | ICD-10-CM | POA: Diagnosis not present

## 2019-02-07 DIAGNOSIS — F339 Major depressive disorder, recurrent, unspecified: Secondary | ICD-10-CM | POA: Diagnosis not present

## 2019-02-07 DIAGNOSIS — Z794 Long term (current) use of insulin: Secondary | ICD-10-CM

## 2019-02-07 DIAGNOSIS — E1159 Type 2 diabetes mellitus with other circulatory complications: Secondary | ICD-10-CM

## 2019-02-07 NOTE — Patient Instructions (Addendum)
Licensed Clinical Actuary Provided: No  Goals we discussed today:   Current Barriers:  M ental Health Concerns  Car needs repairs  Financial issues (difficulty paying monthly bills)  Decreased Social Support  Goal:Patient Stated: "I want help in managing my depression"  Clinical Social Work Clinical Goal(s):Over the next30days, client will work with LCSW to address concerns related to depression issues of client  Interventions:  LCSW reminded client about CCM program services.   LCSW encouraged client to talk with RNCM, Demetrios Loll, to discuss nursing needs of client.  LCSW talked with client about family stress issues for client  Patient Self Care Activities:  Attends all scheduled provider appointments  Takes prescribed medications as scheduled  Cares for pet dog as means of relaxation  Plan:  LCSW to call client inthreeweeks to further discuss management of depression issues of client  Client to call LCSW as needed to discuss depression issues of client.  Client to communicate as needed with RNCM Demetrios Loll, to discuss nursing needs of client.  Client to use relaxation techniques of choice to help her manage depression issues faced by client  *initial goal documentation  Follow Up Plan:LCSW to call client in3weeks to discuss managementof depression issues of client.  The patient verbalized understanding of instructions provided today and declined a print copy of patient instruction materials.   [name].Kyndall Amero MSW, LCSW Licensed Clinical Social Worker [facility] [phone]

## 2019-02-07 NOTE — Chronic Care Management (AMB) (Signed)
  Chronic Care Management    Clinical Social Work General Follow Up Note  02/07/2019 Name: Ashley Watts MRN: [id number] DOB: December 17, 1960  Ashley Watts is a 58 y.o. year old female who is a primary care patient of [name], [name], [name]. Ashley Masters, [name] asked the CCM team to consult the patient for assistance with psychosocial assessment and counseling support  Review of patient status, including review of consultants reports, relevant laboratory and other test results, and collaboration with appropriate care team members and the patient's provider was performed as part of comprehensive patient evaluation and provision of chronic care management services.    Depression screen PHQ 2/9 01/22/2019  Decreased Interest 2  Down, Depressed, Hopeless 2  PHQ - 2 Score 4  Altered sleeping 2  Tired, decreased energy 2  Change in appetite 1  Feeling bad or failure about yourself  2  Trouble concentrating 3  Moving slowly or fidgety/restless 3  Suicidal thoughts 2  PHQ-9 Score 19  Difficult doing work/chores -     Social Determinants of Health: At risk for Depression  Current Barriers:  M ental Health Concerns  Car needs repairs  Financial issues (difficulty paying monthly bills)  Decreased Social Support  Goal:Patient Stated: "I want help in managing my depression"  Clinical Social Work Clinical Goal(s):Over the next30days, client will work with LCSW to address concerns related to depression issues of client  Interventions:  LCSW reminded client about CCM program services.   LCSW encouraged client to talk with RNCM, Ashley Watts, to discuss nursing needs of client.  LCSW talked with client about family stress issues for client  Patient Self Care Activities:  Attends all scheduled provider appointments  Takes prescribed medications as scheduled  Cares for pet dog as means of relaxation  Plan:  LCSW to call client inthreeweeks to further discuss  management of depression issues of client  Client to call LCSW as needed to discuss depression issues of client.  Client to communicate as needed with RNCM Ashley Watts, to discuss nursing needs of client.  Client to use relaxation techniques of choice to help her manage depression issues faced by client  *initial goal documentation  Follow Up Plan:LCSW to call client in3weeks to discuss managementof depression issues of client.  [name].Arlo Buffone MSW, LCSW Licensed Clinical Social Worker [facility] [phone]

## 2019-02-11 DIAGNOSIS — H5213 Myopia, bilateral: Secondary | ICD-10-CM | POA: Diagnosis not present

## 2019-02-14 ENCOUNTER — Other Ambulatory Visit: Payer: Self-pay | Admitting: Family Medicine

## 2019-02-14 ENCOUNTER — Telehealth: Payer: Self-pay | Admitting: Family Medicine

## 2019-02-14 DIAGNOSIS — Z8742 Personal history of other diseases of the female genital tract: Secondary | ICD-10-CM

## 2019-02-14 MED ORDER — FLUCONAZOLE 150 MG PO TABS: 150.0000 mg | ORAL_TABLET | Freq: Once | ORAL | 0 refills | Status: AC

## 2019-02-14 NOTE — Telephone Encounter (Signed)
Pt aware.

## 2019-02-14 NOTE — Telephone Encounter (Signed)
RX sent to [facility]

## 2019-02-15 ENCOUNTER — Ambulatory Visit (INDEPENDENT_AMBULATORY_CARE_PROVIDER_SITE_OTHER): Payer: Medicare Other | Admitting: Nurse Practitioner

## 2019-02-15 ENCOUNTER — Other Ambulatory Visit: Payer: Self-pay

## 2019-02-15 ENCOUNTER — Encounter: Payer: Self-pay | Admitting: Nurse Practitioner

## 2019-02-15 VITALS — BP 151/85 | HR 79 | Temp 97.0°F | Resp 22 | Ht 65.0 in | Wt 200.2 lb

## 2019-02-15 DIAGNOSIS — J069 Acute upper respiratory infection, unspecified: Secondary | ICD-10-CM | POA: Diagnosis not present

## 2019-02-15 MED ORDER — BENZONATATE 100 MG PO CAPS: 100.0000 mg | ORAL_CAPSULE | Freq: Three times a day (TID) | ORAL | 0 refills | Status: DC | PRN

## 2019-02-15 MED ORDER — AMOXICILLIN-POT CLAVULANATE 875-125 MG PO TABS: 1.0000 | ORAL_TABLET | Freq: Two times a day (BID) | ORAL | 0 refills | Status: DC

## 2019-02-15 NOTE — Patient Instructions (Signed)

## 2019-02-15 NOTE — Progress Notes (Signed)
Subjective:    Patient ID: Ashley Watts, female    DOB: 05/13/1961, 58 y.o.   MRN: [id number]   Chief Complaint: Cough (2 weeks); Nasal Congestion; and Fever   HPI Patient comes in today c/o cough and congestion for over 2 weeks. Slight fever. She has been taking nyquil which helps her sleep   Review of Systems  Constitutional: Positive for fever (fever 2 weeks ago but not now.). Negative for chills.  HENT: Positive for congestion, postnasal drip, rhinorrhea and sore throat. Negative for sinus pressure, sneezing, trouble swallowing and voice change.   Respiratory: Positive for cough (nonproductive).   Cardiovascular: Negative.   Gastrointestinal: Negative.   Genitourinary: Negative.   Musculoskeletal: Positive for myalgias (slight).  Neurological: Negative for headaches.  Psychiatric/Behavioral: Negative.   All other systems reviewed and are negative.      Objective:   Physical Exam Vitals signs and nursing note reviewed.  Constitutional:      Appearance: Normal appearance.  HENT:     Right Ear: Hearing, tympanic membrane, ear canal and external ear normal.     Left Ear: Hearing, tympanic membrane, ear canal and external ear normal.     Nose: Mucosal edema, congestion and rhinorrhea present.     Right Sinus: Maxillary sinus tenderness present. No frontal sinus tenderness.     Left Sinus: Maxillary sinus tenderness present. No frontal sinus tenderness.     Mouth/Throat:     Lips: Pink.     Pharynx: Oropharynx is clear.     Tonsils: No tonsillar exudate. Swelling: 0 on the right. 0 on the left.  Neck:     Musculoskeletal: Normal range of motion and neck supple.  Cardiovascular:     Rate and Rhythm: Normal rate and regular rhythm.  Pulmonary:     Effort: Pulmonary effort is normal. No respiratory distress.     Breath sounds: Normal breath sounds. No wheezing.     Comments: Deep wet cough  Lymphadenopathy:     Cervical: No cervical adenopathy.  Skin:  General: Skin is warm.  Neurological:     General: No focal deficit present.     Mental Status: She is alert and oriented to person, place, and time.  Psychiatric:        Mood and Affect: Mood normal.        Behavior: Behavior normal.    BP (!) 151/85   Pulse 79   Temp (!) 97 F (36.1 C) (Oral)   Resp (!) 22   Ht 5\' 5"  (1.651 m)   Wt 200 lb 3.2 oz (90.8 kg)   SpO2 98%   BMI 33.32 kg/m        Assessment & Plan:  [name] in today with chief complaint of Cough (2 weeks); Nasal Congestion; and Fever   1. Upper respiratory infection with cough and congestion 1. Take meds as prescribed 2. Use a cool mist humidifier especially during the winter months and when heat has been humid. 3. Use saline nose sprays frequently 4. Saline irrigations of the nose can be very helpful if done frequently.  * 4X daily for 1 week*  * Use of a nettie pot can be helpful with this. Follow directions with this* 5. Drink plenty of fluids 6. Keep thermostat turn down low 7.For any cough or congestion  Use plain Mucinex- regular strength or max strength is fine   * Children- consult with Pharmacist for dosing 8. For fever or aces or pains- take tylenol  or ibuprofen appropriate for age and weight.  * for fevers greater than 101 orally you may alternate ibuprofen and tylenol every  3 hours.    - amoxicillin-clavulanate (AUGMENTIN) 875-125 MG tablet; Take 1 tablet by mouth 2 (two) times daily.  Dispense: 14 tablet; Refill: 0 - benzonatate (TESSALON PERLES) 100 MG capsule; Take 1 capsule (100 mg total) by mouth 3 (three) times daily as needed for cough.  Dispense: 20 capsule; Refill: 0  Mary-Margaret Daphine Deutscher, [name]

## 2019-02-20 ENCOUNTER — Telehealth: Payer: Self-pay | Admitting: Family Medicine

## 2019-02-20 ENCOUNTER — Other Ambulatory Visit: Payer: Self-pay | Admitting: Family Medicine

## 2019-02-20 DIAGNOSIS — J069 Acute upper respiratory infection, unspecified: Secondary | ICD-10-CM

## 2019-02-20 MED ORDER — BENZONATATE 100 MG PO CAPS: 100.0000 mg | ORAL_CAPSULE | Freq: Three times a day (TID) | ORAL | 0 refills | Status: DC | PRN

## 2019-02-20 NOTE — Telephone Encounter (Signed)
One refill. If symptoms persist, she needs to be reevaluated.

## 2019-02-20 NOTE — Telephone Encounter (Signed)
I will send in RX

## 2019-02-20 NOTE — Telephone Encounter (Signed)
Patient aware.

## 2019-02-21 ENCOUNTER — Encounter: Payer: Medicare Other | Admitting: Family Medicine

## 2019-02-27 ENCOUNTER — Ambulatory Visit: Payer: Self-pay | Admitting: Licensed Clinical Social Worker

## 2019-02-27 DIAGNOSIS — Z794 Long term (current) use of insulin: Secondary | ICD-10-CM

## 2019-02-27 DIAGNOSIS — I10 Essential (primary) hypertension: Secondary | ICD-10-CM

## 2019-02-27 DIAGNOSIS — E1169 Type 2 diabetes mellitus with other specified complication: Secondary | ICD-10-CM

## 2019-02-27 DIAGNOSIS — E785 Hyperlipidemia, unspecified: Secondary | ICD-10-CM

## 2019-02-27 DIAGNOSIS — E1159 Type 2 diabetes mellitus with other circulatory complications: Secondary | ICD-10-CM

## 2019-02-27 DIAGNOSIS — F339 Major depressive disorder, recurrent, unspecified: Secondary | ICD-10-CM

## 2019-02-27 NOTE — Patient Instructions (Signed)
Licensed Clinical Social Worker Visit Information  Goals we discussed today:  Goals Addressed            This Visit's Progress   . Client stated: "I want help in managing my depression" (pt-stated)       Current Barriers:  [name] Mental Health Concerns   . Financial challenges . Car needs repair . Social isolation occasionally  Clinical Social Work Clinical Goal(s):  [name] Over the next 30 days, client will work with SW to address depression issues of client and managing depression symptoms  Interventions: . Provided patient with information about CCM program services  . Talked with client about family stress issues . Encouraged client to talk with RN CM regarding nursing needs of client  Patient Self Care Activities:  . Self administers medications as prescribed . Attends all scheduled provider appointments  . Cares for pet dog as means of relaxation  Plan  LCSW to call client in 3 weeks to discuss client management of depression symptoms of client Client to attend scheduled medical appointments Client to communicate with RN CM related to nursing issues of client Client to use relaxation techniques to help her manage depression symptoms  Please see past updates related to this goal by clicking on the "Past Updates" button in the selected goal      Materials Provided: No  Follow Up Plan: LCSW to call client in 3 weeks to discuss client management of depression symptoms of client  The patient verbalized understanding of instructions provided today and declined a print copy of patient instruction materials.   [name].Clayborn Milnes MSW, LCSW Licensed Clinical Social Worker [facility] [phone]

## 2019-02-27 NOTE — Chronic Care Management (AMB) (Signed)
  Care Management Note   Ashley Watts is a 58 y.o. year old female who is a primary care patient of [name], [name], [name]. The CM team was consulted for assistance with chronic disease management and care coordination.   I reached out to Ashley Watts by phone today. .  Review of patient status, including review of consultants reports, relevant laboratory and other test results, and collaboration with appropriate care team members and the patient's provider was performed as part of comprehensive patient evaluation and provision of chronic care management services.   Social Determinants of Health: Risk for Depression    Office Visit from 02/15/2019 in [facility]  PHQ-9 Total Score  10      Goals Addressed            This Visit's Progress   . Client stated: "I want help in managing my depression"       Current Barriers:  [name] Mental Health Concerns   . Financial challenges . Car needs repair . Social isolation occasionally  Clinical Social Work Clinical Goal(s):  [name] Over the next 30 days, client will work with SW to address depression issues of client and managing depression symptoms  Interventions: . Provided patient with information about CCM program services  . Talked with client about family stress issues . Encouraged client to talk with RN CM regarding nursing needs of client  Patient Self Care Activities:  . Self administers medications as prescribed . Attends all scheduled provider appointments  . Cares for pet dog as means of relaxation  Plan  LCSW to call client in 3 weeks to discuss client management of depression symptoms of client Client to attend scheduled medical appointments Client to communicate with RN CM related to nursing issues of client Client to use relaxation techniques to help her manage depression symptoms  Please see past updates related to this goal by clicking on the "Past Updates" button in the selected goal       Follow Up Plan: LCSW to call client in 3 weeks to discuss client management of depression symptoms.   [name].Abcde Oneil MSW, LCSW Licensed Clinical Social Worker [facility] [phone]

## 2019-03-07 ENCOUNTER — Telehealth: Payer: Self-pay | Admitting: Family Medicine

## 2019-03-08 ENCOUNTER — Other Ambulatory Visit: Payer: Self-pay | Admitting: Family Medicine

## 2019-03-08 DIAGNOSIS — Z8619 Personal history of other infectious and parasitic diseases: Secondary | ICD-10-CM

## 2019-03-08 MED ORDER — FLUCONAZOLE 150 MG PO TABS: 150.0000 mg | ORAL_TABLET | Freq: Once | ORAL | 0 refills | Status: AC

## 2019-03-08 NOTE — Telephone Encounter (Signed)
RX sent to [facility] pharmacy

## 2019-03-08 NOTE — Telephone Encounter (Signed)
Pt aware.

## 2019-03-19 ENCOUNTER — Ambulatory Visit (INDEPENDENT_AMBULATORY_CARE_PROVIDER_SITE_OTHER): Payer: Medicare Other | Admitting: Licensed Clinical Social Worker

## 2019-03-19 DIAGNOSIS — E1169 Type 2 diabetes mellitus with other specified complication: Secondary | ICD-10-CM

## 2019-03-19 DIAGNOSIS — Z794 Long term (current) use of insulin: Secondary | ICD-10-CM

## 2019-03-19 DIAGNOSIS — E1159 Type 2 diabetes mellitus with other circulatory complications: Secondary | ICD-10-CM

## 2019-03-19 DIAGNOSIS — I10 Essential (primary) hypertension: Secondary | ICD-10-CM | POA: Diagnosis not present

## 2019-03-19 DIAGNOSIS — F339 Major depressive disorder, recurrent, unspecified: Secondary | ICD-10-CM

## 2019-03-19 DIAGNOSIS — E785 Hyperlipidemia, unspecified: Secondary | ICD-10-CM

## 2019-03-19 NOTE — Patient Instructions (Signed)
Licensed Clinical Social Worker Visit Information  Goals we discussed today:  Goals Addressed            This Visit's Progress   . Client stated: "I want help in managing my depression" (pt-stated)       Current Barriers:  [name] Mental Health Concerns   . Financial challenges . Car needs repair . Social isolation occasionally  Clinical Social Work Clinical Goal(s):  [name] Over the next 30 days, client will work with SW to address depression issues of client and managing depression symptoms  Interventions: . Talked with client about family stress issues . Encouraged client to talk with RN CM regarding nursing needs of client . Talked with client about depression issues of client and managing depression symptoms . Encouraged client to use relaxation techniques to help her manage depression symptoms faced (client likes to walk outdoors, care for pet dog,listen to music, watch TV)  Patient Self Care Activities:  . Self administers medications as prescribed . Attends all scheduled provider appointments  . Cares for pet dog as means of relaxation  Plan  LCSW to call client in 3 weeks to discuss client management of depression symptoms of client Client to attend scheduled medical appointments Client to communicate with RN CM related to nursing issues of client Client to use relaxation techniques to help her manage depression symptoms  Please see past updates related to this goal by clicking on the "Past Updates" button in the selected goal     Materials Provided: No  Follow Up Plan: LCSW to call client in next 3 weeks to discuss client management of depression symptoms of client  The patient verbalized understanding of instructions provided today and declined a print copy of patient instruction materials.   [name].Talina Pleitez MSW, LCSW Licensed Clinical Social Worker [facility] [phone]

## 2019-03-19 NOTE — Chronic Care Management (AMB) (Signed)
  Care Management Note   Ashley Watts is a 58 y.o. year old female who is a primary care patient of [name], [name], [name]. The CM team was consulted for assistance with chronic disease management and care coordination.   I reached out to [name] by phone today.   Review of patient status, including review of consultants reports, relevant laboratory and other test results, and collaboration with appropriate care team members and the patient's provider was performed as part of comprehensive patient evaluation and provision of chronic care management services.   Social Determinants of Health:  At risk for Depression; has financial challenges    Office Visit from 02/15/2019 in [facility]  PHQ-9 Total Score  10     Goals Addressed            This Visit's Progress   . Client stated: "I want help in managing my depression" (pt-stated)       Current Barriers:  [name] Mental Health Concerns   . Financial challenges . Car needs repair . Social isolation occasionally  Clinical Social Work Clinical Goal(s):  [name] Over the next 30 days, client will work with SW to address depression issues of client and managing depression symptoms  Interventions: . Talked with client about family stress issues . Encouraged client to talk with RN CM regarding nursing needs of client . Talked with client about depression issues of client and managing depression symptoms  Provided counseling support with client   Encouraged client to use relaxation techniques to manage symptoms faced (listen to music, going on errands with son in community, walks around home, enjoys caring for her pet dog)     Patient Self Care Activities:  . Self administers medications as prescribed . Attends all scheduled provider appointments  . Cares for pet dog as means of relaxation  Plan  LCSW to call client in 3 weeks to discuss client management of depression symptoms of client Client to attend  scheduled medical appointments Client to communicate with RN CM related to nursing issues of client Client to use relaxation techniques to help her manage depression symptoms  Please see past updates related to this goal by clicking on the "Past Updates" button in the selected goal     Client discussed the current needs of her niece. Client said she (client) has had some difficulty sleeping since she is concerned over the needs of her niece. Client spoke of occasional pain issues. Client said she has financial challenges as well. Son of client helps transport client to grocery store, pharmacy or medical appointments.  Follow Up Plan: LCSW to call client in next 3 weeks to discuss client management of depression symptoms faced  [name].Kymora Sciara MSW, LCSW Licensed Clinical Social Worker [facility] [phone]

## 2019-03-20 ENCOUNTER — Ambulatory Visit (INDEPENDENT_AMBULATORY_CARE_PROVIDER_SITE_OTHER): Payer: Medicare Other | Admitting: Family Medicine

## 2019-03-20 ENCOUNTER — Ambulatory Visit: Payer: Self-pay | Admitting: Licensed Clinical Social Worker

## 2019-03-20 ENCOUNTER — Other Ambulatory Visit: Payer: Self-pay

## 2019-03-20 ENCOUNTER — Encounter: Payer: Self-pay | Admitting: Family Medicine

## 2019-03-20 ENCOUNTER — Ambulatory Visit: Payer: Self-pay | Admitting: *Deleted

## 2019-03-20 DIAGNOSIS — F339 Major depressive disorder, recurrent, unspecified: Secondary | ICD-10-CM

## 2019-03-20 DIAGNOSIS — E1169 Type 2 diabetes mellitus with other specified complication: Secondary | ICD-10-CM

## 2019-03-20 DIAGNOSIS — E1159 Type 2 diabetes mellitus with other circulatory complications: Secondary | ICD-10-CM

## 2019-03-20 DIAGNOSIS — Z794 Long term (current) use of insulin: Secondary | ICD-10-CM

## 2019-03-20 DIAGNOSIS — E785 Hyperlipidemia, unspecified: Secondary | ICD-10-CM

## 2019-03-20 DIAGNOSIS — G8929 Other chronic pain: Secondary | ICD-10-CM | POA: Diagnosis not present

## 2019-03-20 DIAGNOSIS — M546 Pain in thoracic spine: Secondary | ICD-10-CM | POA: Diagnosis not present

## 2019-03-20 DIAGNOSIS — I10 Essential (primary) hypertension: Secondary | ICD-10-CM

## 2019-03-20 MED ORDER — DICLOFENAC SODIUM 1 % TD GEL: 2.0000 g | Freq: Four times a day (QID) | TRANSDERMAL | 3 refills | Status: AC

## 2019-03-20 NOTE — Patient Instructions (Signed)
Licensed Clinical Social Worker Visit Information  Goals we discussed today:  Goals    . Client stated: "I want help in managing my depression" (pt-stated)     Current Barriers:  [name] Mental Health Concerns   . Financial challenges . Car needs repair . Social isolation occasionally  Clinical Social Work Clinical Goal(s):  [name] Over the next 30 days, client will work with SW to address depression issues of client and managing depression symptoms  Interventions: . Talked with client about family stress issues (niece of client has been hospitalized in [place] and niece of client is now on life support assistance) . Encouraged client to talk with RN CM regarding nursing needs of client . Talked with client about depression issues of client and managing depression symptoms  Patient Self Care Activities:  . Self administers medications as prescribed . Attends all scheduled provider appointments  . Cares for pet dog as means of relaxation  Plan  LCSW to call client in 3 weeks to discuss client management of depression symptoms of client Client to attend scheduled medical appointments Client to communicate with RN CM related to nursing issues of client Client to use relaxation techniques to help her manage depression symptoms  Please see past updates related to this goal by clicking on the "Past Updates" button in the selected goal     Materials Provided: No  Follow Up Plan: LCSW to call client in next 3 weeks to discuss with client her management of depression  symptoms faced.  The patient verbalized understanding of instructions provided today and declined a print copy of patient instruction materials.   [name].Isel Skufca MSW, LCSW Licensed Clinical Social Worker [facility] [phone]

## 2019-03-20 NOTE — Chronic Care Management (AMB) (Signed)
  Care Management Note   Ashley Watts is a 58 y.o. year old female who is a primary care patient of [name], [name], [name]. The CM team was consulted for assistance with chronic disease management and care coordination.   I reached out to [name] by phone today.   Review of patient status, including review of consultants reports, relevant laboratory and other test results, and collaboration with appropriate care team members and the patient's provider was performed as part of comprehensive patient evaluation and provision of chronic care management services.   Social Determinants of Health:At risk for Depression    Office Visit from 02/15/2019 in [facility]  PHQ-9 Total Score  10     Goals    . Client stated: "I want help in managing my depression" (pt-stated)     Current Barriers:  [name] Mental Health Concerns   . Financial challenges . Car needs repair . Social isolation occasionally  Clinical Social Work Clinical Goal(s):  [name] Over the next 30 days, client will work with SW to address depression issues of client and managing depression symptoms  Interventions: . Talked with client about family stress issues (her niece has been recently hospitalized in [place] and her niece is now on life support assistance in the hospital) . Encouraged client to talk with RN CM regarding nursing needs of client . Talked with client about depression issues of client and managing depression symptoms  Patient Self Care Activities:  . Self administers medications as prescribed . Attends all scheduled provider appointments  . Cares for pet dog as means of relaxation  Plan  LCSW to call client in 3 weeks to discuss client management of depression symptoms of client Client to attend scheduled medical appointments Client to communicate with RN CM related to nursing issues of client Client to use relaxation techniques to help her manage depression symptoms  Please  see past updates related to this goal by clicking on the "Past Updates" button in the selected goal     Client said she was concerned about medical needs of her niece who is now hospitalized in [place] and on life support. Client said she has been worried about her niece and client said she (client) has not been sleeping well. Client spoke of neck pain and back pain.  LCSW called RNCM on 4/152020 and informed RN CM of above information and informed RNCM of client pain issues.  Follow Up Plan: LCSW to call client in next 3 weeks to discuss client management of depression symptoms faced  [name].Aolani Piggott MSW, LCSW Licensed Clinical Social Worker [facility] Care [facility] [phone]

## 2019-03-20 NOTE — Patient Instructions (Signed)
Moist heat Tylenol three times daily Voltaren gel

## 2019-03-20 NOTE — Chronic Care Management (AMB) (Signed)
  Chronic Care Management   RN Case Manager Telephone Note     03/20/2019 Name: Ashley Watts MRN: [id number] DOB: 06/30/1961  Referred by: Ashley Masters, [name] Reason for referral : Chronic Care Management (RNCM telephone follow up)   Ashley Watts is a 58 y.o. year old female who is a primary care patient of Ashley Masters, [name]. The CCM team was consulted for assistance with chronic disease management and care coordination needs.    Review of patient status, including review of consultants reports, relevant laboratory and other test results, and collaboration with appropriate care team members and the patient's provider was performed as part of comprehensive patient evaluation and provision of chronic care management services.   I spoke with Ashley Watts by telephone at the request of Ashley Needle "Ashley Watts" Forrest, LCSW who has been talking with Ashley Watts about her psychosocial needs. Ashley Watts reached out to Ashley Watts today to discuss a recent traumatic family experience and she also complained of neck/back pain.     Goals Addressed    . "I need help with my back pain" (pt-stated)       Nurse Case Manager Clinical Goal(s):  [name] Over the next 14 days, patient will verbalize understanding of plan for treatment of neck/back pain.  . Over the next 2 days, patient will work with Ashley Silvius, [name] (PCP) to address needs related to neck/back pain.  Interventions:  . Reviewed medications with patient and discussed Tramadol. Patient states that she has been prescribed 50mg  in the past and that it does not help.  [name] Appointment scheduled with PCP, Ashley Silvius, [name] for this afternoon  Patient Self Care Activities:  . Attends all scheduled provider appointments . Calls pharmacy for medication refills . Performs ADL's independently         Follow Up Plan  RNCM will reach out to patient over the next 14 days  Reassess neck/back pain  Address other nursing care needs: diabetes, hypertension, CHF    Ashley Loll, RN-BC, BSN Nurse Case Manager [facility] [phone]
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
## 2019-03-20 NOTE — Patient Instructions (Signed)
Visit Information  Goals Addressed            This Visit's Progress   . "I need help with my back pain" (pt-stated)       Nurse Case Manager Clinical Goal(s):  [name] Over the next 14 days, patient will verbalize understanding of plan for treatment of neck/back pain.  . Over the next 2 days, patient will work with Gilford Silvius, [name] (PCP) to address needs related to neck/back pain.  Interventions:  . Reviewed medications with patient and discussed Tramadol. Patient states that she has been prescribed 50mg  in the past and that it does not help.  [name] Appointment scheduled with PCP, Gilford Silvius, [name] for this afternoon  Patient Self Care Activities:  . Attends all scheduled provider appointments . Calls pharmacy for medication refills . Performs ADL's independently  Initial goal documentation         The patient verbalized understanding of instructions provided today and declined a print copy of patient instruction materials.   Next PCP appointment scheduled for: today at 2:35 RNCM will reach out over the next 14 days  Demetrios Loll, RN-BC, BSN Nurse Case Manager [facility] [phone]

## 2019-04-05 ENCOUNTER — Telehealth: Payer: Self-pay | Admitting: *Deleted

## 2019-04-05 NOTE — Telephone Encounter (Signed)
[facility] pharmacy aware to change to lantus

## 2019-04-05 NOTE — Telephone Encounter (Signed)
Can switch to lantus

## 2019-04-05 NOTE — Telephone Encounter (Signed)
TC from [facility] pharmacy Pt has made her Rx for Basaglar last till now & is getting it refilled. Rx is non-preferred on her insurance.  Preferred are Lantus, Toujeo, or Evaristo Bury Please advise

## 2019-04-09 ENCOUNTER — Ambulatory Visit: Payer: Self-pay | Admitting: Licensed Clinical Social Worker

## 2019-04-09 DIAGNOSIS — I10 Essential (primary) hypertension: Secondary | ICD-10-CM

## 2019-04-09 DIAGNOSIS — E1159 Type 2 diabetes mellitus with other circulatory complications: Secondary | ICD-10-CM

## 2019-04-09 DIAGNOSIS — E1169 Type 2 diabetes mellitus with other specified complication: Secondary | ICD-10-CM

## 2019-04-09 DIAGNOSIS — E785 Hyperlipidemia, unspecified: Secondary | ICD-10-CM

## 2019-04-09 DIAGNOSIS — Z794 Long term (current) use of insulin: Secondary | ICD-10-CM

## 2019-04-09 DIAGNOSIS — F339 Major depressive disorder, recurrent, unspecified: Secondary | ICD-10-CM

## 2019-04-09 DIAGNOSIS — K219 Gastro-esophageal reflux disease without esophagitis: Secondary | ICD-10-CM

## 2019-04-09 NOTE — Patient Instructions (Signed)
Licensed Clinical Social Worker Visit Information  Goals we discussed today:  Goals Addressed            This Visit's Progress   . Client stated: "I want help in managing my depression" (pt-stated)       Current Barriers:  [name] Mental Health Concerns   . Financial challenges . Car needs repair . Social isolation occasionally  Clinical Social Work Clinical Goal(s):  [name] Over the next 30 days, client will work with LCSW to address depression issues of client and managing depression symptoms  Interventions: . Talked with client about family stress issues . Encouraged client to talk with RN CM regarding nursing needs of client . Talked with client about depression issues of client and managing depression symptoms . Talked with client about managing health issues faced by client  Patient Self Care Activities:  . Self administers medications as prescribed . Attends all scheduled provider appointments  . Cares for pet dog as means of relaxation  Plan  LCSW to call client in 3 weeks to discuss client management of depression symptoms of client Client to attend scheduled medical appointments Client to communicate with RN CM related to nursing issues of client Client to use relaxation techniques to help her manage depression symptoms  Please see past updates related to this goal by clicking on the "Past Updates" button in the selected goal       Materials Provided: No  Follow Up Plan: LCSW to call client in next 3 weeks to discuss client management of depression symptoms of client  The patient verbalized understanding of instructions provided today and declined a print copy of patient instruction materials.   [name].Aurelius Gildersleeve MSW, LCSW Licensed Clinical Social Worker [facility] [phone]

## 2019-04-09 NOTE — Chronic Care Management (AMB) (Signed)
  Care Management Note   Ashley Watts is a 58 y.o. year old female who is a primary care patient of [name], [name], [name]. The CM team was consulted for assistance with chronic disease management and care coordination.   I reached out to [name] by phone today.   Review of patient status, including review of consultants reports, relevant laboratory and other test results, and collaboration with appropriate care team members and the patient's provider was performed as part of comprehensive patient evaluation and provision of chronic care management services.   Social Determinants of Health:Risk for Depression   Office Visit from 02/15/2019 in [facility]  PHQ-9 Total Score  10     Goals Addressed            This Visit's Progress   . Client stated: "I want help in managing my depression" (pt-stated)       Current Barriers:  [name] Mental Health Concerns   . Financial challenges . Car needs repair . Social isolation occasionally  Clinical Social Work Clinical Goal(s):  [name] Over the next 30 days, client will work with LCSW to address depression issues of client and managing depression symptoms  Interventions: . Talked with client about family stress issues . Encouraged client to talk with RN CM regarding nursing needs of client . Talked with client about depression issues of client and managing depression symptoms . Talked with client about managing health issues faced by client  Patient Self Care Activities:  . Self administers medications as prescribed . Attends all scheduled provider appointments  . Cares for pet dog as means of relaxation  Plan  LCSW to call client in 3 weeks to discuss client management of depression symptoms of client Client to attend scheduled medical appointments Client to communicate with RN CM related to nursing issues of client Client to use relaxation techniques to help her manage depression symptoms  Please see past  updates related to this goal by clicking on the "Past Updates" button in the selected goal     Follow Up Plan: LCSW to call Kyrra in next 3 weeks to discuss client management of depression symptoms.  [name].Lior Hoen MSW, LCSW Licensed Clinical Social Worker [facility] [phone]

## 2019-04-10 ENCOUNTER — Ambulatory Visit (INDEPENDENT_AMBULATORY_CARE_PROVIDER_SITE_OTHER): Payer: Medicare Other | Admitting: Licensed Clinical Social Worker

## 2019-04-10 DIAGNOSIS — E785 Hyperlipidemia, unspecified: Secondary | ICD-10-CM | POA: Diagnosis not present

## 2019-04-10 DIAGNOSIS — Z794 Long term (current) use of insulin: Secondary | ICD-10-CM

## 2019-04-10 DIAGNOSIS — I10 Essential (primary) hypertension: Secondary | ICD-10-CM

## 2019-04-10 DIAGNOSIS — E1169 Type 2 diabetes mellitus with other specified complication: Secondary | ICD-10-CM | POA: Diagnosis not present

## 2019-04-10 DIAGNOSIS — K219 Gastro-esophageal reflux disease without esophagitis: Secondary | ICD-10-CM

## 2019-04-10 DIAGNOSIS — E1159 Type 2 diabetes mellitus with other circulatory complications: Secondary | ICD-10-CM

## 2019-04-10 DIAGNOSIS — F339 Major depressive disorder, recurrent, unspecified: Secondary | ICD-10-CM | POA: Diagnosis not present

## 2019-04-10 NOTE — Chronic Care Management (AMB) (Signed)
  Care Management Note   Ashley Watts is a 58 y.o. year old female who is a primary care patient of Ashley Watts, Ashley Albino, [name]. The CM team was consulted for assistance with chronic disease management and care coordination.   I reached out to Ashley Watts by phone today.   Review of patient status, including review of consultants reports, relevant laboratory and other test results, and collaboration with appropriate care team members and the patient's provider was performed as part of comprehensive patient evaluation and provision of chronic care management services.   Social Determinants of Health:Risk for Depression    Office Visit from 02/15/2019 in [facility]  PHQ-9 Total Score  10     Goals Addressed            This Visit's Progress   . Client stated: "I want help in managing my depression" (pt-stated)       Current Barriers:  [name] Mental Health Concerns   . Financial challenges . Social isolation occasionally  Clinical Social Work Clinical Goal(s):  [name] Over the next 30 days, client will work with LCSW to address depression issues of client and managing depression symptoms  Interventions: . Talked with client about family stress issues . Encouraged client to talk with RN CM regarding nursing needs of client . Talked with client about depression issues of client and managing depression symptoms . Talked with client about managing health issues faced by client . Talked with client about social support network of client (son and friends of client)  Patient Self Care Activities:  . Self administers medications as prescribed . Attends all scheduled provider appointments  . Cares for pet dog as means of relaxation  Plan  LCSW to call client in 3 weeks to discuss client management of depression symptoms of client Client to attend scheduled medical appointments Client to communicate with RN CM related to nursing issues of client Client to use  relaxation techniques to help her manage depression symptoms  Please see past updates related to this goal by clicking on the "Past Updates" button in the selected goal      Client had talked previously with LCSW about her concerns related to the health needs of her niece. Her niece had been receiving care in ICU unit in hospital in [place]. Also client has reported that she (client) has difficulty sleeping. Client does receive some support from her son  Follow Up Plan: LCSW to call client in next 3 weeks to discuss client management of depression symptoms of client  Ashley Watts.Ashley Watts MSW, LCSW Licensed Clinical Social Worker [facility] [phone]

## 2019-04-10 NOTE — Patient Instructions (Signed)
Licensed Clinical Social Worker Visit Information  Goals we discussed today:  Goals Addressed            This Visit's Progress   . Client stated: "I want help in managing my depression" (pt-stated)       Current Barriers:  [name] Mental Health Concerns   . Financial challenges . Social isolation occasionally  Clinical Social Work Clinical Goal(s):  [name] Over the next 30 days, client will work with LCSW to address depression issues of client and managing depression symptoms  Interventions: . Talked with client about family stress issues . Encouraged client to talk with RN CM regarding nursing needs of client . Talked with client about depression issues of client and managing depression symptoms . Talked with client about managing health issues faced by client . Talked with client about social support network of client (son and friends of client)  Patient Self Care Activities:  . Self administers medications as prescribed . Attends all scheduled provider appointments  . Cares for pet dog as means of relaxation  Plan  LCSW to call client in 3 weeks to discuss client management of depression symptoms of client Client to attend scheduled medical appointments Client to communicate with RN CM related to nursing issues of client Client to use relaxation techniques to help her manage depression symptoms  Please see past updates related to this goal by clicking on the "Past Updates" button in the selected goal       Materials Provided: No  Follow Up Plan: LCSW to call client in next 3 weeks to discuss client management of depression symptoms of client.  The patient verbalized understanding of instructions provided today and declined a print copy of patient instruction materials.   [name].Ayannah Faddis MSW, LCSW Licensed Clinical Social Worker [facility] [phone]

## 2019-04-12 ENCOUNTER — Telehealth: Payer: Self-pay | Admitting: Family Medicine

## 2019-04-12 ENCOUNTER — Ambulatory Visit: Payer: Medicare Other | Admitting: *Deleted

## 2019-04-12 ENCOUNTER — Other Ambulatory Visit: Payer: Self-pay

## 2019-04-12 DIAGNOSIS — F339 Major depressive disorder, recurrent, unspecified: Secondary | ICD-10-CM

## 2019-04-12 DIAGNOSIS — G8929 Other chronic pain: Secondary | ICD-10-CM
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
## 2019-04-16 NOTE — Patient Instructions (Addendum)
Visit Information  Goals Addressed      Patient Stated   . "I need help with my back pain" (pt-stated)       Nurse Case Manager Clinical Goal(s):  [name] Over the next 30 days, patient will work with Gilford Silvius, [name] (PCP) to treat chronic back pain.  . Over the next 30 days, patient will verbalize understanding of plan for treatment of neck/back pain.   Interventions:  . Reviewed past office notes o Instructed to f/u with PCP if symptoms did not improve . Discussed 30 year history of back pain and previous treatments o Previous visits with chiropractor and physical therapy o Has not tried any alternative therapies like acupuncture o Has used Tramadol most recently but it doesn't work well . Will need to schedule face to face visit with PCP  Patient Self Care Activities:  . Attends all scheduled provider appointments . Calls pharmacy for medication refills . Performs ADL's independently    . "I would like to have more social contact" (pt-stated)       Current Barriers:  [name] Knowledge Deficits related to local volunteer opportunities and social groups . 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 Patient stated that she really enjoyed working with people and she misses that.   Recommended that patient contact [employer] in [place] to inquire about their volunteer program.   Location information provided  I will mail additional information to patient's home address (see below)  Patient is connected with embedded LCSW already  Patient Self Care Activities:  . Performs ADL's independently       Volunteering at [employer]   [address] [place]  [place], [place] [address]  [phone]  [email or web address]     Special Need Banker  . Serve as our Energy manager - [employer] is in need of someone to assist with managing, planning and organizing our volunteers and the different volunteer programs that are a part of our ministry.    [name] Help with our fundraising campaigns - Fundraising efforts are essential to [employer] of [employer]. If you are gifted in developing relationships with potential donors, community promotion, clerical duties, or maintaining databases, then come be a part of our Architect.    . Psychologist, sport and exercise, social Consulting civil engineer, Environmental manager or videographer - Provide skilled web site maintenance support, social [employer], high quality photos and videos for use on our web site and in [employer] - We know that pictures tell a story in a way that words cannot. Donate your skills as a Psychologist, sport and exercise, Journalist, newspaper, Environmental manager or videographer and help [place] tell our story to the world.     . Provide administrative support - All ministries have their business side and [facility] is no different. We are in need of volunteers to assist with making copies, filing, calling donors, volunteers and community partners, maintaining accurate mailing lists for [facility], writing thank you notes and other clerical duties.    Other Special educational needs teacher . Work in the [employer] of [employer] - Have a Dealer for retail? Love to see the possibilities in gently used items? The come assist at [facility] [employer] by sorting, cleaning, pricing and labeling donated  items. Assist our customers by answering questions and helping with their purchases.    . Salvage materials to be sold in our salvage store - Using hand tools, you can remove usable windows, doors, cabinets, moldings, appliances, flooring, plumbing fixtures and much more from homes, buildings and apartments. These materials are sold at a discount in our Art gallery manager. We are also in need of trucks and trailers that can be used  on salvage days to bring items back to the store.     . Teach a class in our Learn to [employer] program - Gifted teachers make all the difference in a person's learning experience. Come share your gift with our clients and help them learn money management and budgeting. We are  looking to add new classes so let us know if you have can teach a skill that would help our clients as they move toward self-reliance.    . Serve meals at [facility] - Clients at [facility] are always in need of a smiling face and a kind word as they come in for a much-needed hot meal. You can be Jesus to them by sharing a kind greeting and caring conversation. Meet their physical needs by serving prepared meals including drinks and dessert    . Work at the [employer] - Spend a day helping [place] [employer]. Volunteers are needed for picking up food items, doing inventory of what we have on hand, stocking the shelves and cleaning up. If you have great people skills, you can greet clients, collect basic information and help carry boxes to cars. Orientation for first time helpers is at 8:30 on Food Pant[employer] Day - the last Saturday of the month.    [name] Special Events Volunteers - Your skills could just be what is needed to make the next [facility] Special Event a success. Let us know if you can fill ones of these roles. o Museum/gallery exhibitions officer or guest entertainment o Event sponsor to underwrite event expenses o Behind the Barrister's clerk for venue set up & take down o Event host to greet participants, share information and answer questions o Meal server to assist with meal preparation, service and clean up o Provide donations of food, paper products, drinks, etc. Each event will have a list of specific needed items.    Follow Up Plan  The CM team will reach out to the patient again over the next 14 days.   RNCM plans to address self management of diabetes and heart failure   Demetrios Loll, RN-BC, BSN Nurse Care Manager [facility] [phone]   The patient verbalized understanding of instructions provided today and declined a print copy of patient instruction materials.

## 2019-04-17 ENCOUNTER — Ambulatory Visit: Payer: Self-pay | Admitting: *Deleted

## 2019-04-17 DIAGNOSIS — F339 Major depressive disorder, recurrent, unspecified: Secondary | ICD-10-CM

## 2019-04-17 DIAGNOSIS — G8929 Other chronic pain: Secondary | ICD-10-CM

## 2019-04-17 NOTE — Chronic Care Management (AMB) (Signed)
  Chronic Care Management   Telephone Note  04/17/2019 Name: Ashley Watts MRN: [id number] DOB: 1961-10-09  Confirmed patient's appointment with Kari Baars, [name] on 04/23/19 for a physical wihtout a pap. She will contact [facility] to arrange transportation. I plan to talk with Marcelino Duster prior to that appointment to discuss pain management options.  Also advised that I put some educational materials regarding alternative therapies and volunteer opportunities in the mail for her.   Follow up plan: RNCM will follow up with patient over the next 30 days and sooner if needed  Demetrios Loll, RN-BC, BSN Nurse Care Manager [facility] [phone]

## 2019-04-17 NOTE — Patient Instructions (Signed)
Keep appointment with Kari Baars, [name] for physical appointment on 04/23/19.

## 2019-04-19 ENCOUNTER — Other Ambulatory Visit: Payer: Self-pay

## 2019-04-19 NOTE — Patient Outreach (Signed)
[facility]) Care Management  04/19/2019  Ashley Watts 07/05/1961 [id number]   Medication Adherence call to Ashley Watts patient answer and was ask if she is still taking a cholesterol medication she respond yes but when she was ask for her date of birth yes hung up. Ashley Watts is showing past due under [employer].  Lillia Abed CPhT Pharmacy Technician [facility] Care Management Direct Dial [phone]  Fax [phone] [email or web address]

## 2019-04-22 ENCOUNTER — Telehealth: Payer: Self-pay | Admitting: Family Medicine

## 2019-04-22 ENCOUNTER — Other Ambulatory Visit: Payer: Self-pay

## 2019-04-23 ENCOUNTER — Encounter: Payer: Medicare Other | Admitting: Family Medicine

## 2019-05-01 ENCOUNTER — Ambulatory Visit: Payer: Self-pay | Admitting: Licensed Clinical Social Worker

## 2019-05-01 DIAGNOSIS — E1159 Type 2 diabetes mellitus with other circulatory complications: Secondary | ICD-10-CM

## 2019-05-01 DIAGNOSIS — K219 Gastro-esophageal reflux disease without esophagitis: Secondary | ICD-10-CM

## 2019-05-01 DIAGNOSIS — I10 Essential (primary) hypertension: Secondary | ICD-10-CM

## 2019-05-01 DIAGNOSIS — E785 Hyperlipidemia, unspecified: Secondary | ICD-10-CM

## 2019-05-01 DIAGNOSIS — E1169 Type 2 diabetes mellitus with other specified complication: Secondary | ICD-10-CM

## 2019-05-01 DIAGNOSIS — F339 Major depressive disorder, recurrent, unspecified: Secondary | ICD-10-CM

## 2019-05-01 NOTE — Patient Instructions (Signed)
Licensed Clinical Social Worker Visit Information  Goals we discussed today:  Goals Addressed            This Visit's Progress   . Client stated: "I want help in managing my depression" (pt-stated)       Current Barriers:  [name] Mental Health Concerns   . Financial challenges . Social isolation occasionally  Clinical Social Work Clinical Goal(s):  [name] Over the next 30 days, client will work with LCSW to address depression issues of client and managing depression symptoms  Interventions: . Talked with client about family stress issues . Encouraged client to talk with RN CM regarding nursing needs of client . Talked with client about depression issues of client and managing depression symptoms . Talked with client about social support network of client  Patient Self Care Activities:  . Self administers medications as prescribed . Attends all scheduled provider appointments  . Cares for pet dog as means of relaxation  Plan  LCSW to call client in next 3 weeks to discuss client management of depression symptoms of client Client to attend scheduled medical appointments Client to communicate with RN CM related to nursing issues of client Client to use relaxation techniques to help her manage depression symptoms  Please see past updates related to this goal by clicking on the "Past Updates" button in the selected goal       Materials Provided: No  Follow Up Plan: LCSW to call client in next 3 weeks to discuss client management of depression symptoms of client  The patient verbalized understanding of instructions provided today and declined a print copy of patient instruction materials.   [name].Dana Debo MSW, LCSW Licensed Clinical Social Worker [facility] Care Management [phone]

## 2019-05-01 NOTE — Chronic Care Management (AMB) (Signed)
  Care Management Note   Ashley Watts is a 58 y.o. year old female who is a primary care patient of [name], [name], [name]. The CM team was consulted for assistance with chronic disease management and care coordination.   I reached out to [name] by phone today.   Review of patient status, including review of consultants reports, relevant laboratory and other test results, and collaboration with appropriate care team members and the patient's provider was performed as part of comprehensive patient evaluation and provision of chronic care management services.   Social Determinants of Health:Risk for Depression; Risk for Stress; Risk for Social Isolation    Office Visit from 02/15/2019 in [facility]  PHQ-9 Total Score  10     Goals Addressed            This Visit's Progress   . Client stated: "I want help in managing my depression" (pt-stated)       Current Barriers:  [name] Mental Health Concerns   . Financial challenges . Social isolation occasionally  Clinical Social Work Clinical Goal(s):  [name] Over the next 30 days, client will work with LCSW to address depression issues of client and managing depression symptoms  Interventions: . Previously talked with client about family stress issues . Encouraged client to talk with RN CM regarding nursing needs of client . Talked with client about depression issues of client and managing depression symptoms . Talked with client about social support network of client  Patient Self Care Activities:  . Self administers medications as prescribed . Attends all scheduled provider appointments  . Cares for pet dog as means of relaxation  Plan  LCSW to call client in next 3 weeks to discuss client management of depression symptoms of client Client to attend scheduled medical appointments Client to communicate with RN CM related to nursing issues of client Client to use relaxation techniques to help her manage  depression symptoms (she likes taking walks outdoors)  Please see past updates related to this goal by clicking on the "Past Updates" button in the selected goal      LCSW previously talked with client about her concerns regarding health needs of client's niece. Client reported that her niece is out of hospital and is doing better. Client has used transport help with [facility]. Her son also helps to transport client occasionally to her medical appointments. Client has difficulty sleeping. LCSW encouraged Ashley Watts to talk with [facility] regarding nursing needs of client. LCSW encouraged Ashley Watts to talk with LCSW as needed to discuss social work needs of client. Client said she has appointment scheduled with Gilford Silvius [name] at [facility] in June of 2020  Follow Up Plan: LCSW to call client in next 3 weeks to discuss client management of depression symptoms faced  [name].Karilyn Wind MSW, LCSW Licensed Clinical Social Worker [facility] Care Management [phone]

## 2019-05-06 ENCOUNTER — Ambulatory Visit (INDEPENDENT_AMBULATORY_CARE_PROVIDER_SITE_OTHER): Payer: Medicare Other | Admitting: Family Medicine

## 2019-05-06 ENCOUNTER — Other Ambulatory Visit: Payer: Self-pay

## 2019-05-06 ENCOUNTER — Encounter: Payer: Self-pay | Admitting: Family Medicine

## 2019-05-06 DIAGNOSIS — G8929 Other chronic pain: Secondary | ICD-10-CM | POA: Diagnosis not present

## 2019-05-06 DIAGNOSIS — M546 Pain in thoracic spine: Secondary | ICD-10-CM | POA: Diagnosis not present
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
## 2019-05-10 ENCOUNTER — Encounter: Payer: Self-pay | Admitting: Family Medicine

## 2019-05-10 ENCOUNTER — Other Ambulatory Visit: Payer: Self-pay

## 2019-05-10 ENCOUNTER — Ambulatory Visit (INDEPENDENT_AMBULATORY_CARE_PROVIDER_SITE_OTHER): Payer: Medicare Other | Admitting: Family Medicine

## 2019-05-10 VITALS — BP 138/88 | HR 85 | Temp 98.8°F | Ht 65.0 in | Wt 195.0 lb

## 2019-05-10 DIAGNOSIS — N3090 Cystitis, unspecified without hematuria: Secondary | ICD-10-CM

## 2019-05-10 DIAGNOSIS — Z1212 Encounter for screening for malignant neoplasm of rectum: Secondary | ICD-10-CM

## 2019-05-10 DIAGNOSIS — Z0001 Encounter for general adult medical examination with abnormal findings: Secondary | ICD-10-CM | POA: Diagnosis not present

## 2019-05-10 DIAGNOSIS — M546 Pain in thoracic spine: Secondary | ICD-10-CM

## 2019-05-10 DIAGNOSIS — F339 Major depressive disorder, recurrent, unspecified: Secondary | ICD-10-CM

## 2019-05-10 DIAGNOSIS — Z0000 Encounter for general adult medical examination without abnormal findings: Secondary | ICD-10-CM | POA: Diagnosis not present

## 2019-05-10 DIAGNOSIS — E1159 Type 2 diabetes mellitus with other circulatory complications: Secondary | ICD-10-CM

## 2019-05-10 DIAGNOSIS — I5022 Chronic systolic (congestive) heart failure: Secondary | ICD-10-CM

## 2019-05-10 DIAGNOSIS — E785 Hyperlipidemia, unspecified: Secondary | ICD-10-CM

## 2019-05-10 DIAGNOSIS — Z1211 Encounter for screening for malignant neoplasm of colon: Secondary | ICD-10-CM

## 2019-05-10 DIAGNOSIS — R350 Frequency of micturition: Secondary | ICD-10-CM | POA: Diagnosis not present

## 2019-05-10 DIAGNOSIS — E1169 Type 2 diabetes mellitus with other specified complication: Secondary | ICD-10-CM

## 2019-05-10 DIAGNOSIS — Z794 Long term (current) use of insulin: Secondary | ICD-10-CM

## 2019-05-10 DIAGNOSIS — Z1239 Encounter for other screening for malignant neoplasm of breast: Secondary | ICD-10-CM

## 2019-05-10 DIAGNOSIS — E039 Hypothyroidism, unspecified: Secondary | ICD-10-CM

## 2019-05-10 DIAGNOSIS — I10 Essential (primary) hypertension: Secondary | ICD-10-CM | POA: Diagnosis not present

## 2019-05-10 DIAGNOSIS — Z13820 Encounter for screening for osteoporosis: Secondary | ICD-10-CM

## 2019-05-10 DIAGNOSIS — G8929 Other chronic pain: Secondary | ICD-10-CM

## 2019-05-10 DIAGNOSIS — K219 Gastro-esophageal reflux disease without esophagitis: Secondary | ICD-10-CM

## 2019-05-10 LAB — MICROSCOPIC EXAMINATION
Epithelial Cells (non renal): >10 /hpf — AB (ref 0–10)
Renal Epithel, UA: NONE SEEN /hpf

## 2019-05-10 LAB — BAYER DCA HB A1C WAIVED: HB A1C (BAYER DCA - WAIVED): 8.7 % — ABNORMAL HIGH (ref <7.0)

## 2019-05-10 LAB — URINALYSIS, COMPLETE
Bilirubin, UA: NEGATIVE
Glucose, UA: NEGATIVE
Ketones, UA: NEGATIVE
Nitrite, UA: NEGATIVE
Specific Gravity, UA: 1.02 (ref 1.005–1.030)
Urobilinogen, Ur: 0.2 mg/dL (ref 0.2–1.0)
pH, UA: 5 (ref 5.0–7.5)

## 2019-05-10 LAB — LIPID PANEL

## 2019-05-10 MED ORDER — CYMBALTA 30 MG PO CPEP: 30.0000 mg | ORAL_CAPSULE | Freq: Every day | ORAL | 3 refills | Status: DC

## 2019-05-10 MED ORDER — SYNTHROID 150 MCG PO TABS: 150.0000 ug | ORAL_TABLET | Freq: Every day | ORAL | 1 refills | Status: DC

## 2019-05-10 MED ORDER — CARVEDILOL 12.5 MG PO TABS: 12.5000 mg | ORAL_TABLET | Freq: Two times a day (BID) | ORAL | 1 refills | Status: DC

## 2019-05-10 MED ORDER — GLUCOPHAGE 1000 MG PO TABS: 1000.0000 mg | ORAL_TABLET | Freq: Two times a day (BID) | ORAL | 1 refills | Status: AC

## 2019-05-10 MED ORDER — SERTRALINE HCL 25 MG PO TABS: 25.0000 mg | ORAL_TABLET | Freq: Every day | ORAL | 3 refills | Status: DC

## 2019-05-10 MED ORDER — BASAGLAR KWIKPEN 100 UNIT/ML ~~LOC~~ SOPN: 50.0000 [IU] | PEN_INJECTOR | Freq: Every day | SUBCUTANEOUS | 1 refills | Status: DC

## 2019-05-10 MED ORDER — SIMVASTATIN 10 MG PO TABS: 10.0000 mg | ORAL_TABLET | Freq: Every day | ORAL | 1 refills | Status: DC

## 2019-05-10 MED ORDER — FUROSEMIDE 40 MG PO TABS: 40.0000 mg | ORAL_TABLET | Freq: Every day | ORAL | 3 refills | Status: AC

## 2019-05-10 MED ORDER — PANTOPRAZOLE SODIUM 40 MG PO TBEC: 40.0000 mg | DELAYED_RELEASE_TABLET | Freq: Every day | ORAL | 1 refills | Status: DC

## 2019-05-10 MED ORDER — LISINOPRIL 40 MG PO TABS: 40.0000 mg | ORAL_TABLET | Freq: Every day | ORAL | 1 refills | Status: DC
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
## 2019-05-11 LAB — CBC WITH DIFFERENTIAL/PLATELET
Basophils Absolute: 0.1 10*3/uL (ref 0.0–0.2)
Basos: 1 %
EOS (ABSOLUTE): 0.4 10*3/uL (ref 0.0–0.4)
Eos: 5 %
Hematocrit: 37.3 % (ref 34.0–46.6)
Hemoglobin: 12.3 g/dL (ref 11.1–15.9)
Immature Grans (Abs): 0 10*3/uL (ref 0.0–0.1)
Immature Granulocytes: 0 %
Lymphocytes Absolute: 2.4 10*3/uL (ref 0.7–3.1)
Lymphs: 28 %
MCH: 26 pg — ABNORMAL LOW (ref 26.6–33.0)
MCHC: 33 g/dL (ref 31.5–35.7)
MCV: 79 fL (ref 79–97)
Monocytes Absolute: 0.6 10*3/uL (ref 0.1–0.9)
Monocytes: 7 %
Neutrophils Absolute: 4.9 10*3/uL (ref 1.4–7.0)
Neutrophils: 59 %
Platelets: 341 10*3/uL (ref 150–450)
RBC: 4.73 x10E6/uL (ref 3.77–5.28)
RDW: 14.3 % (ref 11.7–15.4)
WBC: 8.4 10*3/uL (ref 3.4–10.8)

## 2019-05-11 LAB — CMP14+EGFR
ALT: 35 IU/L — ABNORMAL HIGH (ref 0–32)
AST: 26 IU/L (ref 0–40)
Albumin/Globulin Ratio: 1.7 (ref 1.2–2.2)
Albumin: 4.7 g/dL (ref 3.8–4.9)
Alkaline Phosphatase: 86 IU/L (ref 39–117)
BUN/Creatinine Ratio: 19 (ref 9–23)
BUN: 17 mg/dL (ref 6–24)
Bilirubin Total: 0.5 mg/dL (ref 0.0–1.2)
CO2: 22 mmol/L (ref 20–29)
Calcium: 10.3 mg/dL — ABNORMAL HIGH (ref 8.7–10.2)
Chloride: 100 mmol/L (ref 96–106)
Creatinine, Ser: 0.9 mg/dL (ref 0.57–1.00)
GFR calc Af Amer: 83 mL/min/{1.73_m2} (ref >59)
GFR calc non Af Amer: 72 mL/min/{1.73_m2} (ref >59)
Globulin, Total: 2.7 g/dL (ref 1.5–4.5)
Glucose: 212 mg/dL — ABNORMAL HIGH (ref 65–99)
Potassium: 4.8 mmol/L (ref 3.5–5.2)
Sodium: 140 mmol/L (ref 134–144)
Total Protein: 7.4 g/dL (ref 6.0–8.5)

## 2019-05-11 LAB — LIPID PANEL
Chol/HDL Ratio: 4.6 ratio — ABNORMAL HIGH (ref 0.0–4.4)
Cholesterol, Total: 155 mg/dL (ref 100–199)
HDL: 34 mg/dL — ABNORMAL LOW (ref >39)
LDL Calculated: 79 mg/dL (ref 0–99)
Triglycerides: 210 mg/dL — ABNORMAL HIGH (ref 0–149)
VLDL Cholesterol Cal: 42 mg/dL — ABNORMAL HIGH (ref 5–40)

## 2019-05-11 LAB — TSH: TSH: 0.107 u[IU]/mL — ABNORMAL LOW (ref 0.450–4.500)

## 2019-05-12 MED ORDER — LEVOTHYROXINE SODIUM 125 MCG PO TABS: 125.0000 ug | ORAL_TABLET | Freq: Every day | ORAL | 3 refills | Status: DC

## 2019-05-12 NOTE — Addendum Note (Signed)
Addended by: Baruch Gouty on: 05/12/2019 09:22 PM   Modules accepted: Orders

## 2019-05-13 LAB — URINE CULTURE

## 2019-05-13 MED ORDER — SULFAMETHOXAZOLE-TRIMETHOPRIM 800-160 MG PO TABS: 1.0000 | ORAL_TABLET | Freq: Two times a day (BID) | ORAL | 0 refills | Status: AC

## 2019-05-13 NOTE — Addendum Note (Signed)
Addended by: Baruch Gouty on: 05/13/2019 07:53 AM   Modules accepted: Orders

## 2019-05-14 ENCOUNTER — Telehealth: Payer: Self-pay | Admitting: Family Medicine

## 2019-05-16 ENCOUNTER — Other Ambulatory Visit: Payer: Self-pay | Admitting: Family Medicine

## 2019-05-16 DIAGNOSIS — F339 Major depressive disorder, recurrent, unspecified: Secondary | ICD-10-CM

## 2019-05-16 DIAGNOSIS — G8929 Other chronic pain: Secondary | ICD-10-CM

## 2019-05-16 MED ORDER — CYMBALTA 30 MG PO CPEP: 30.0000 mg | ORAL_CAPSULE | Freq: Every day | ORAL | 3 refills | Status: DC

## 2019-05-16 NOTE — Telephone Encounter (Signed)
Pt aware.

## 2019-05-16 NOTE — Telephone Encounter (Signed)
The cymbalta is ok. I did not prescribe the Ultram

## 2019-05-16 NOTE — Telephone Encounter (Signed)
PT had visit on 6/5 with rakes pt states that her tramadol and cymbalta have not been sent to [facility]

## 2019-05-16 NOTE — Telephone Encounter (Signed)
[name], these were refused - just making sure this is right?  She was seen by you for CPE 05/10/19.   cymbalta and tramadol

## 2019-05-16 NOTE — Addendum Note (Signed)
Addended by: Zannie Cove on: 05/16/2019 02:53 PM   Modules accepted: Orders

## 2019-05-17 ENCOUNTER — Telehealth: Payer: Self-pay | Admitting: Family Medicine

## 2019-05-17 NOTE — Telephone Encounter (Signed)
Patient aware she was sent to pain clinic.

## 2019-05-20 DIAGNOSIS — M545 Low back pain: Secondary | ICD-10-CM | POA: Diagnosis not present

## 2019-05-20 DIAGNOSIS — M25561 Pain in right knee: Secondary | ICD-10-CM | POA: Diagnosis not present

## 2019-05-20 DIAGNOSIS — Z79899 Other long term (current) drug therapy: Secondary | ICD-10-CM | POA: Diagnosis not present

## 2019-05-20 DIAGNOSIS — M542 Cervicalgia: Secondary | ICD-10-CM | POA: Diagnosis not present

## 2019-05-21 ENCOUNTER — Encounter: Payer: Self-pay | Admitting: Internal Medicine

## 2019-05-22 ENCOUNTER — Ambulatory Visit: Payer: Medicare Other | Admitting: Physical Therapy

## 2019-05-22 ENCOUNTER — Ambulatory Visit: Payer: Self-pay | Admitting: Licensed Clinical Social Worker

## 2019-05-22 DIAGNOSIS — E785 Hyperlipidemia, unspecified: Secondary | ICD-10-CM

## 2019-05-22 DIAGNOSIS — F339 Major depressive disorder, recurrent, unspecified: Secondary | ICD-10-CM

## 2019-05-22 DIAGNOSIS — E1159 Type 2 diabetes mellitus with other circulatory complications: Secondary | ICD-10-CM

## 2019-05-22 DIAGNOSIS — E1169 Type 2 diabetes mellitus with other specified complication: Secondary | ICD-10-CM

## 2019-05-22 DIAGNOSIS — K219 Gastro-esophageal reflux disease without esophagitis: Secondary | ICD-10-CM

## 2019-05-22 NOTE — Patient Instructions (Signed)
Licensed Clinical Social Worker Visit Information   . Client stated: "I want help in managing my depression" (pt-stated)     Current Barriers:  [name] Mental Health Concerns   . Financial challenges . Social isolation occasionally  Clinical Social Work Clinical Goal(s):  [name] Over the next 30 days, client will work with LCSW to address depression issues of client and managing depression symptoms  Interventions: . Previously talked with client about family stress issues . Previously encouraged client to talk with RN CM regarding nursing needs of client . Previously talked with client about depression issues of client and managing depression symptoms . Talked with client about managing health issues faced by client . Previously talked with client about social support network of client  Patient Self Care Activities:  . Self administers medications as prescribed . Attends all scheduled provider appointments  . Cares for pet dog as means of relaxation  Plan  LCSW to call client in next 3 weeks to discuss client management of depression symptoms of client Client to attend scheduled medical appointments Client to communicate with RN CM related to nursing issues of client Client to use relaxation techniques to help her manage depression symptoms  Please see past updates related to this goal by clicking on the "Past Updates" button in the selected goal     Materials Provided: No  Follow Up Plan: LCSW to call client in next 3 weeks to discuss client management of depression symptoms of client  The patient verbalized understanding of instructions provided today and declined a print copy of patient instruction materials.   [name].Shia Eber MSW, LCSW Licensed Clinical Social Worker [facility] Care Management [phone]

## 2019-05-22 NOTE — Chronic Care Management (AMB) (Signed)
  Chronic Care Management    Clinical Social Work CCM Outreach Note  05/22/2019 Name: Ashley Watts MRN: [id number] DOB: December 13, 1960  Ashley Watts is a 58 y.o. year old female who is a primary care patient of [name], Connye Burkitt, [name] . The CCM team was consulted for assistance with assessment of psychosocial needs.   LCSW reached out to Ashley Watts today by phone.     Office Visit from 05/10/2019 in [place]  PHQ-9 Total Score  5        . Client stated: "I want help in managing my depression" (pt-stated)     Current Barriers:  [name] Mental Health Concerns   . Financial challenges . Social isolation occasionally  Clinical Social Work Clinical Goal(s):  [name] Over the next 30 days, client will work with LCSW to address depression issues of client and managing depression symptoms  Interventions: . Previously talked with client about family stress issues . Previously encouraged client to talk with RN CM regarding nursing needs of client . Previously talked with client about depression issues of client and managing depression symptoms . Talked with client about managing health issues faced by client . Previously talked with client about social support network of client  Patient Self Care Activities:  . Self administers medications as prescribed . Attends all scheduled provider appointments  . Cares for pet dog as means of relaxation  Plan LCSW to call client in next 3 weeks to discuss client management of depression symptoms of client Client to attend scheduled medical appointments Client to communicate with RN CM related to nursing issues of client Client to use relaxation techniques to help her manage depression symptoms  Please see past updates related to this goal by clicking on the "Past Updates" button in the selected goal      Follow Up Plan: LCSW to call client in next 3 weeks to discuss client management of depression symptoms of client   [name].Rockne Dearinger MSW, LCSW Licensed Clinical Social Worker [facility] Care Management [phone]

## 2019-05-29 ENCOUNTER — Ambulatory Visit (INDEPENDENT_AMBULATORY_CARE_PROVIDER_SITE_OTHER): Payer: Medicare Other | Admitting: Licensed Clinical Social Worker

## 2019-05-29 DIAGNOSIS — F339 Major depressive disorder, recurrent, unspecified: Secondary | ICD-10-CM

## 2019-05-29 DIAGNOSIS — E1159 Type 2 diabetes mellitus with other circulatory complications: Secondary | ICD-10-CM | POA: Diagnosis not present

## 2019-05-29 DIAGNOSIS — E1169 Type 2 diabetes mellitus with other specified complication: Secondary | ICD-10-CM

## 2019-05-29 DIAGNOSIS — Z794 Long term (current) use of insulin: Secondary | ICD-10-CM

## 2019-05-29 DIAGNOSIS — Z79899 Other long term (current) drug therapy: Secondary | ICD-10-CM | POA: Diagnosis not present

## 2019-05-29 DIAGNOSIS — M545 Low back pain: Secondary | ICD-10-CM | POA: Diagnosis not present

## 2019-05-29 DIAGNOSIS — E785 Hyperlipidemia, unspecified: Secondary | ICD-10-CM | POA: Diagnosis not present

## 2019-05-29 DIAGNOSIS — I10 Essential (primary) hypertension: Secondary | ICD-10-CM | POA: Diagnosis not present

## 2019-05-29 DIAGNOSIS — M542 Cervicalgia: Secondary | ICD-10-CM | POA: Diagnosis not present

## 2019-05-29 DIAGNOSIS — M25561 Pain in right knee: Secondary | ICD-10-CM | POA: Diagnosis not present

## 2019-05-29 DIAGNOSIS — K219 Gastro-esophageal reflux disease without esophagitis: Secondary | ICD-10-CM

## 2019-05-29 NOTE — Chronic Care Management (AMB) (Signed)
  Chronic Care Management    Clinical Social Work CCM Outreach Note  05/29/2019 Name: Ashley Watts MRN: [id number] DOB: [birth year]  Ashley Watts is a 58 y.o. year old female who is a primary care patient of [name], Connye Burkitt, [name] . The CCM team was consulted for assistance with assessment of psychosocial needs.   LCSW reached out to Ashley Watts today by phone    Social Determinants of Health:Risk for Depression; Risk for Social Isolation; Risk for Stress    Office Visit from 05/10/2019 in [place]  PHQ-9 Total Score  5     Goals        . Client stated: "I want help in managing my depression" (pt-stated)     Current Barriers:  [name] Mental Health Concerns   . Financial challenges . Social isolation occasionally  Clinical Social Work Clinical Goal(s):  [name] Over the next 30 days, client will work with LCSW to address depression issues of client and managing depression symptoms  Interventions: . Previously talked with client about family stress issues . Previously encouraged client to talk with RN CM regarding nursing needs of client . Previously talked with client about depression issues of client and managing depression symptoms . Previously talked with client about managing health issues faced by client . Previously talked with client about social support network of client  Patient Self Care Activities:  . Self administers medications as prescribed . Attends all scheduled provider appointments  . Cares for pet dog as means of relaxation  Plan LCSW to call client in next 3 weeks to discuss client management of depression symptoms of client Client to attend scheduled medical appointments Client to communicate with RN CM related to nursing issues of client Client to use relaxation techniques to help her manage depression symptoms  Please see past updates related to this goal by clicking on the "Past Updates" button in the selected goal      Follow Up Plan: LCSW to call client in next 3 weeks to talk with client about client management of depression symptoms faced by client  [name].Aceyn Kathol MSW, LCSW Licensed Clinical Social Worker [facility] Care Management [phone]

## 2019-05-29 NOTE — Patient Instructions (Signed)
Licensed Clinical Social Worker Visit Information  Goals we discussed today:   Goals    . Client stated: "I want help in managing my depression" (pt-stated)     Current Barriers:  [name] Mental Health Concerns   . Financial challenges . Social isolation occasionally  Clinical Social Work Clinical Goal(s):  [name] Over the next 30 days, client will work with LCSW to address depression issues of client and managing depression symptoms  Interventions: . Previously talked with client about family stress issues . Previously encouraged client to talk with RN CM regarding nursing needs of client . Previously talked with client about depression issues of client and managing depression symptoms . Previously talked with client about managing health issues faced by client . Previously talked with client about social support network of client  Patient Self Care Activities:  . Self administers medications as prescribed . Attends all scheduled provider appointments  . Cares for pet dog as means of relaxation  Plan  LCSW to call client in next 3 weeks to discuss client management of depression symptoms of client Client to attend scheduled medical appointments Client to communicate with RN CM related to nursing issues of client Client to use relaxation techniques to help her manage depression symptoms  Please see past updates related to this goal by clicking on the "Past Updates" button in the selected goal       Materials Provided: No  Follow Up Plan: LCSW to call client in next 3 weeks to discuss client management of  depression symptoms of client  The patient verbalized understanding of instructions provided today and declined a print copy of patient instruction materials.   [name].Decarlo Rivet MSW, LCSW Licensed Clinical Social Worker [facility] [phone]

## 2019-06-04 ENCOUNTER — Other Ambulatory Visit: Payer: Medicare Other

## 2019-06-06 ENCOUNTER — Telehealth: Payer: Self-pay | Admitting: Family Medicine

## 2019-06-06 NOTE — Telephone Encounter (Signed)
Called and aware to call Forestine Na to schedule DEXA. Aware that we do not have a tech to do these for the time being.   Order is in epic = pt aware

## 2019-06-07 DIAGNOSIS — H52223 Regular astigmatism, bilateral: Secondary | ICD-10-CM | POA: Diagnosis not present

## 2019-06-07 DIAGNOSIS — H524 Presbyopia: Secondary | ICD-10-CM | POA: Diagnosis not present

## 2019-06-18 DIAGNOSIS — M545 Low back pain: Secondary | ICD-10-CM | POA: Diagnosis not present

## 2019-06-18 DIAGNOSIS — Z79899 Other long term (current) drug therapy: Secondary | ICD-10-CM | POA: Diagnosis not present

## 2019-06-18 DIAGNOSIS — Z1159 Encounter for screening for other viral diseases: Secondary | ICD-10-CM | POA: Diagnosis not present

## 2019-06-18 DIAGNOSIS — M542 Cervicalgia: Secondary | ICD-10-CM | POA: Diagnosis not present

## 2019-06-18 DIAGNOSIS — M25561 Pain in right knee: Secondary | ICD-10-CM | POA: Diagnosis not present

## 2019-06-19 ENCOUNTER — Ambulatory Visit (INDEPENDENT_AMBULATORY_CARE_PROVIDER_SITE_OTHER): Payer: Medicare Other | Admitting: Licensed Clinical Social Worker

## 2019-06-19 DIAGNOSIS — E1159 Type 2 diabetes mellitus with other circulatory complications: Secondary | ICD-10-CM | POA: Diagnosis not present

## 2019-06-19 DIAGNOSIS — E1169 Type 2 diabetes mellitus with other specified complication: Secondary | ICD-10-CM

## 2019-06-19 DIAGNOSIS — E785 Hyperlipidemia, unspecified: Secondary | ICD-10-CM | POA: Diagnosis not present

## 2019-06-19 DIAGNOSIS — K219 Gastro-esophageal reflux disease without esophagitis: Secondary | ICD-10-CM

## 2019-06-19 DIAGNOSIS — I10 Essential (primary) hypertension: Secondary | ICD-10-CM

## 2019-06-19 DIAGNOSIS — F339 Major depressive disorder, recurrent, unspecified: Secondary | ICD-10-CM | POA: Diagnosis not present

## 2019-06-19 DIAGNOSIS — Z794 Long term (current) use of insulin: Secondary | ICD-10-CM

## 2019-06-19 NOTE — Patient Instructions (Signed)
Licensed Clinical Social Worker Visit Information  Goals we discussed today:   Goals        . Client stated: "I want help in managing my depression" (pt-stated)     Current Barriers:  [name] Mental Health Concerns   . Financial challenges . Social isolation occasionally  Clinical Social Work Clinical Goal(s):  [name] Over the next 30 days, client will work with LCSW to address depression issues of client and managing depression symptoms  Interventions: . Talked with client about family stress issues . Encouraged client to talk with RN CM regarding nursing needs of client . Talked with client about depression issues of client and managing depression symptoms . Provided counseling support for client . Talked with client about social support network of client  Patient Self Care Activities:  . Self administers medications as prescribed . Attends all scheduled provider appointments  . Cares for pet dog as means of relaxation  Plan  LCSW to call client in next 3 weeks to discuss client management of depression symptoms of client Client to attend scheduled medical appointments Client to communicate with RN CM related to nursing issues of client Client to use relaxation techniques to help her manage depression symptoms  Please see past updates related to this goal by clicking on the "Past Updates" button in the selected goal         Materials Provided: No  Follow Up Plan: LCSW to call client in next 3 weeks to discuss client management of depression symptoms of client  The patient verbalized understanding of instructions provided today and declined a print copy of patient instruction materials.   [name].Naelani Lafrance MSW, LCSW Licensed Clinical Social Worker [facility] [phone]

## 2019-06-19 NOTE — Chronic Care Management (AMB) (Signed)
  Care Management Note   Ashley Watts is a 58 y.o. year old female who is a primary care patient of [name], Connye Burkitt, [name]. The CM team was consulted for assistance with chronic disease management and care coordination.   I reached out to Thressa Sheller by phone today.   Review of patient status, including review of consultants reports, relevant laboratory and other test results, and collaboration with appropriate care team members and the patient's provider was performed as part of comprehensive patient evaluation and provision of chronic care management services.   Social Determinants of Health: risk for Depression; risk for Stress; risk for Social Isolation    Office Visit from 05/10/2019 in [place]  PHQ-9 Total Score  5     Goals        . Client stated: "I want help in managing my depression" (pt-stated)     Current Barriers:  [name] Mental Health Concerns   . Financial challenges . Social isolation occasionally  Clinical Social Work Clinical Goal(s):  [name] Over the next 30 days, client will work with LCSW to address depression issues of client and managing depression symptoms  Interventions: . Talked with client about family stress issues . Encouraged client to talk with RN CM regarding nursing needs of client . Talked with client about depression issues of client and managing depression symptoms . Provided counseling support for client . Talked with client about social support network of client  Patient Self Care Activities:  . Self administers medications as prescribed . Attends all scheduled provider appointments  . Cares for pet dog as means of relaxation  Plan  LCSW to call client in next 3 weeks to discuss client management of depression symptoms of client Client to attend scheduled medical appointments Client to communicate with RN CM related to nursing issues of client Client to use relaxation techniques to help her manage depression  symptoms  Please see past updates related to this goal by clicking on the "Past Updates" button in the selected goal      Client said she had her prescribed medications and is taking medications as prescribed She spoke of pain issues in her knee, back and neck. She said she takes a prescribed pain medication.  Client said she likes to walk her dog for relaxation.  She said she has a sister who lives out of town.  She talks occasionally via phone with sister. Client also receives care as scheduled at a Pain clinic in [place] provided counseling support for client. LCSW talked with client about use of relaxation techniques to help her manage depression symptoms.  Follow Up Plan:  LCSW to call client in next 3 weeks to talk with client about her management of depression symptoms of client  [name].Casmira Cramer MSW, LCSW Licensed Clinical Social Worker [facility] [phone]

## 2019-06-27 DIAGNOSIS — R5383 Other fatigue: Secondary | ICD-10-CM | POA: Diagnosis not present

## 2019-07-09 ENCOUNTER — Ambulatory Visit (INDEPENDENT_AMBULATORY_CARE_PROVIDER_SITE_OTHER): Payer: Medicare Other | Admitting: Licensed Clinical Social Worker

## 2019-07-09 DIAGNOSIS — E1159 Type 2 diabetes mellitus with other circulatory complications: Secondary | ICD-10-CM | POA: Diagnosis not present

## 2019-07-09 DIAGNOSIS — E1169 Type 2 diabetes mellitus with other specified complication: Secondary | ICD-10-CM

## 2019-07-09 DIAGNOSIS — I10 Essential (primary) hypertension: Secondary | ICD-10-CM | POA: Diagnosis not present

## 2019-07-09 DIAGNOSIS — F339 Major depressive disorder, recurrent, unspecified: Secondary | ICD-10-CM

## 2019-07-09 DIAGNOSIS — Z79899 Other long term (current) drug therapy: Secondary | ICD-10-CM | POA: Diagnosis not present

## 2019-07-09 DIAGNOSIS — R945 Abnormal results of liver function studies: Secondary | ICD-10-CM | POA: Diagnosis not present

## 2019-07-09 DIAGNOSIS — E785 Hyperlipidemia, unspecified: Secondary | ICD-10-CM | POA: Diagnosis not present

## 2019-07-09 DIAGNOSIS — M545 Low back pain: Secondary | ICD-10-CM | POA: Diagnosis not present

## 2019-07-09 DIAGNOSIS — Z794 Long term (current) use of insulin: Secondary | ICD-10-CM

## 2019-07-09 DIAGNOSIS — M542 Cervicalgia: Secondary | ICD-10-CM | POA: Diagnosis not present

## 2019-07-09 DIAGNOSIS — K219 Gastro-esophageal reflux disease without esophagitis: Secondary | ICD-10-CM

## 2019-07-09 NOTE — Chronic Care Management (AMB) (Signed)
  Care Management Note   Ashley Watts is a 58 y.o. year old female who is a primary care patient of [name], Connye Burkitt, [name]. The CM team was consulted for assistance with chronic disease management and care coordination.   I reached out to Thressa Sheller by phone today.   Review of patient status, including review of consultants reports, relevant laboratory and other test results, and collaboration with appropriate care team members and the patient's provider was performed as part of comprehensive patient evaluation and provision of chronic care management services.   Social Determinants of Health:risk of social isolation;risk for depression; risk for stress    Office Visit from 05/10/2019 in [place]  PHQ-9 Total Score  5     Goals            . Client stated: "I want help in managing my depression" (pt-stated)     Current Barriers:  [name] Mental Health Concerns   . Financial challenges . Social isolation occasionally  Clinical Social Work Clinical Goal(s):  [name] Over the next 30 days, client will work with LCSW to address depression issues of client and managing depression symptoms  Interventions: . Talked with client about family stress issues . Encouraged client to talk with RN CM regarding nursing needs of client . Talked with client about depression issues of client and managing depression symptoms . Talked with client about managing health issues faced by client . Talked with client about social support network of client (has some support from her son)  Patient Self Care Activities:  . Self administers medications as prescribed . Attends all scheduled provider appointments  . Cares for pet dog as means of relaxation  Plan  LCSW to call client in next 3 weeks to discuss client management of depression symptoms of client Client to attend scheduled medical appointments Client to communicate with RN CM related to nursing issues of client Client to  use relaxation techniques to help her manage depression symptoms  Please see past updates related to this goal by clicking on the "Past Updates" button in the selected goal    Client said she had her prescribed medications and is taking medications as prescribed She spoke of pain issues in her knee, back and neck. She said she takes a prescribed pain medication. Client said she goes to a Pain Clinic in [place], [place] as scheduled.  Client said she likes to walk her dog for relaxation.  She said she has a sister who lives out of town.  She talks occasionally via phone with sister. LCSW provided counseling support for client. LCSW talked with client about use of relaxation techniques to help her manage depression symptoms.She said she has occasional difficulty in sleeping. She said she is eating well.  She said she gets help with [facility] transport services to help her go to and from her scheduled appointments.  Client said her mother was diagnosed with cancer and this is concerning to [place]. Client said her mother resides in [place], [place]. [name] said her mother has a good deal of family support.  LCSW talked with Joelene Millin about nursing support with RNCM through CCM program  Follow Up Plan: LCSW to call client in next 3 weeks to discuss client management of depression symptoms  [name].[name] MSW, LCSW Licensed Clinical Social Worker [facility] [phone]

## 2019-07-09 NOTE — Patient Instructions (Signed)
Licensed Clinical Social Worker Visit Information  Goals we discussed today:   Goals        . Client stated: "I want help in managing my depression" (pt-stated)     Current Barriers:  [name] Mental Health Concerns   . Financial challenges . Social isolation occasionally  Clinical Social Work Clinical Goal(s):  [name] Over the next 30 days, client will work with LCSW to address depression issues of client and managing depression symptoms   Interventions: . Talked with client about family stress issues . Encouraged client to talk with RN CM regarding nursing needs of client . Talked with client about depression issues of client and managing depression symptoms . Talked with client about managing health issues faced by client . Talked with client about social support network of client  Patient Self Care Activities:  . Self administers medications as prescribed . Attends all scheduled provider appointments  . Cares for pet dog as means of relaxation  Plan   LCSW to call client in next 3 weeks to discuss client management of depression symptoms of client Client to attend scheduled medical appointments Client to communicate with RN CM related to nursing issues of client Client to use relaxation techniques to help her manage depression symptoms  Please see past updates related to this goal by clicking on the "Past Updates" button in the selected goal      Materials Provided: No  Follow Up Plan: LCSW to call client in next 3 weeks to discuss client management of depression symptoms of client   The patient verbalized understanding of instructions provided today and declined a print copy of patient instruction materials.   [name].Lanelle Lindo MSW, LCSW Licensed Clinical Social Worker [facility] [phone]

## 2019-07-11 ENCOUNTER — Ambulatory Visit: Payer: Medicare Other | Admitting: Gastroenterology

## 2019-07-18 ENCOUNTER — Other Ambulatory Visit: Payer: Self-pay | Admitting: Family Medicine

## 2019-07-18 DIAGNOSIS — E1159 Type 2 diabetes mellitus with other circulatory complications: Secondary | ICD-10-CM

## 2019-07-18 DIAGNOSIS — H93293 Other abnormal auditory perceptions, bilateral: Secondary | ICD-10-CM | POA: Diagnosis not present

## 2019-07-18 DIAGNOSIS — H9319 Tinnitus, unspecified ear: Secondary | ICD-10-CM | POA: Diagnosis not present

## 2019-07-18 DIAGNOSIS — H608X1 Other otitis externa, right ear: Secondary | ICD-10-CM | POA: Diagnosis not present

## 2019-07-18 DIAGNOSIS — H6980 Other specified disorders of Eustachian tube, unspecified ear: Secondary | ICD-10-CM | POA: Diagnosis not present

## 2019-07-23 DIAGNOSIS — M545 Low back pain: Secondary | ICD-10-CM | POA: Diagnosis not present

## 2019-07-23 DIAGNOSIS — E119 Type 2 diabetes mellitus without complications: Secondary | ICD-10-CM | POA: Diagnosis not present

## 2019-07-23 DIAGNOSIS — R5383 Other fatigue: Secondary | ICD-10-CM | POA: Diagnosis not present

## 2019-07-23 DIAGNOSIS — Z79899 Other long term (current) drug therapy: Secondary | ICD-10-CM | POA: Diagnosis not present

## 2019-07-23 DIAGNOSIS — R0602 Shortness of breath: Secondary | ICD-10-CM | POA: Diagnosis not present

## 2019-07-23 DIAGNOSIS — N951 Menopausal and female climacteric states: Secondary | ICD-10-CM | POA: Diagnosis not present

## 2019-07-23 DIAGNOSIS — E559 Vitamin D deficiency, unspecified: Secondary | ICD-10-CM | POA: Diagnosis not present

## 2019-07-23 DIAGNOSIS — M542 Cervicalgia: Secondary | ICD-10-CM | POA: Diagnosis not present

## 2019-07-30 ENCOUNTER — Ambulatory Visit: Payer: Self-pay | Admitting: Licensed Clinical Social Worker

## 2019-07-30 DIAGNOSIS — K219 Gastro-esophageal reflux disease without esophagitis: Secondary | ICD-10-CM

## 2019-07-30 DIAGNOSIS — E785 Hyperlipidemia, unspecified: Secondary | ICD-10-CM

## 2019-07-30 DIAGNOSIS — F339 Major depressive disorder, recurrent, unspecified: Secondary | ICD-10-CM

## 2019-07-30 DIAGNOSIS — E1159 Type 2 diabetes mellitus with other circulatory complications: Secondary | ICD-10-CM

## 2019-07-30 DIAGNOSIS — E1169 Type 2 diabetes mellitus with other specified complication: Secondary | ICD-10-CM

## 2019-07-30 DIAGNOSIS — I152 Hypertension secondary to endocrine disorders: Secondary | ICD-10-CM

## 2019-07-30 NOTE — Chronic Care Management (AMB) (Signed)
Care Management Note   Ashley Watts is a 58 y.o. year old female who is a primary care patient of [name], Ashley Burkitt, [name]. The CM team was consulted for assistance with chronic disease management and care coordination.   I reached out to Ashley Watts by phone today.   Review of patient status, including review of consultants reports, relevant laboratory and other test results, and collaboration with appropriate care team members and the patient's provider was performed as part of comprehensive patient evaluation and provision of chronic care management services.   Social Determinants of Health: risk of social isolation; risk of depression; risk for stress     Office Visit from 05/10/2019 in [place]  PHQ-9 Total Score  5     Medications   New medications from outside sources are available for reconciliation   aspirin EC 81 MG EC tablet    blood glucose meter kit and supplies    Blood Glucose Monitoring Suppl (ONETOUCH VERIO) w/Device KIT    carvedilol (COREG) 12.5 MG tablet    CYMBALTA 30 MG capsule    fluticasone (FLONASE) 50 MCG/ACT nasal spray    furosemide (LASIX) 40 MG tablet    GLUCOPHAGE 1000 MG tablet    glucose blood (ACCU-CHEK AVIVA) test strip    Lancets (ACCU-CHEK SOFT TOUCH) lancets    LANTUS SOLOSTAR 100 UNIT/ML Solostar Pen    levothyroxine (SYNTHROID) 125 MCG tablet    lisinopril (ZESTRIL) 40 MG tablet    pantoprazole (PROTONIX) 40 MG tablet    simvastatin (ZOCOR) 10 MG tablet    traMADol (ULTRAM) 50 MG tablet     Goals        . Client stated: "I want help in managing my depression" (pt-stated)     Current Barriers:  [name] Mental Health Concerns   . Financial challenges . Social isolation occasionally  Clinical Social Work Clinical Goal(s):  [name] Over the next 30 days, client will work with LCSW to address depression issues of client and managing depression symptoms  Interventions: . Encouraged client to talk with RN CM regarding  nursing needs of client . Talked with client about depression issues of client and managing depression symptoms . Talked with client about managing health issues faced by client . Talked with client about social support network of client  Patient Self Care Activities:  . Self administers medications as prescribed . Attends all scheduled provider appointments  . Cares for pet dog as means of relaxation  Plan  LCSW to call client in next 3 weeks to discuss client management of depression symptoms of client Client to attend scheduled medical appointments Client to communicate with RN CM related to nursing issues of client Client to use relaxation techniques to help her manage depression symptoms  Please see past updates related to this goal by clicking on the "Past Updates" button in the selected goal       Client has prescribed medications and is taking medications as prescribed. She spoke of pain issues faced. She is taking a prescribed pain medication. She said she is walking well.  She said she is sleeping better now.  She said she is eating well. She likes to walk her dog for relaxation.  She likes listening to music to relax. She said she enjoys talking with her sister for social support. She is waiting for an appointment for her at Pain Clinic in [place], [place].  [place] encouraged Ashley Watts to call RNCM as needed for nursing support. [name]  Follow Up Plan: LCSW to call client in next 3 weeks to talk with client about client management of depression symptoms faced   Ashley Watts.Hoby Kawai MSW, LCSW Licensed Clinical Social Worker [facility] [phone]

## 2019-07-30 NOTE — Patient Instructions (Addendum)
Licensed Clinical Social Worker Visit Information  Goals we discussed today:  Goals    . Client stated: "I want help in managing my depression" (pt-stated)     Current Barriers:  [name] Mental Health Concerns   . Financial challenges . Social isolation occasionally  Clinical Social Work Clinical Goal(s):  [name] Over the next 30 days, client will work with LCSW to address depression issues of client and managing depression symptoms  Interventions: . Encouraged client to talk with RN CM regarding nursing needs of client . Talked with client about depression issues of client and managing depression symptoms . Talked with client about managing health issues faced by client . Talked with client about social support network of client  Patient Self Care Activities:  . Self administers medications as prescribed . Attends all scheduled provider appointments   . Cares for pet dog as means of relaxation  Plan  LCSW to call client in next 3 weeks to discuss client management of depression symptoms of client Client to attend scheduled medical appointments Client to communicate with RN CM related to nursing issues of client Client to use relaxation techniques to help her manage depression symptoms  Please see past updates related to this goal by clicking on the "Past Updates" button in the selected goal         Materials Provided: No  Follow Up Plan: LCSW to call client in next 3 weeks to discuss client management of depression symptoms faced by client  The patient verbalized understanding of instructions provided today and declined a print copy of patient instruction materials.   [name].Raesha Coonrod MSW, LCSW Licensed Clinical Social Worker [facility] [phone]

## 2019-08-08 DIAGNOSIS — E559 Vitamin D deficiency, unspecified: Secondary | ICD-10-CM | POA: Diagnosis not present

## 2019-08-08 DIAGNOSIS — E1165 Type 2 diabetes mellitus with hyperglycemia: Secondary | ICD-10-CM | POA: Diagnosis not present

## 2019-08-08 DIAGNOSIS — M545 Low back pain: Secondary | ICD-10-CM | POA: Diagnosis not present

## 2019-08-08 DIAGNOSIS — M542 Cervicalgia: Secondary | ICD-10-CM | POA: Diagnosis not present

## 2019-08-09 ENCOUNTER — Ambulatory Visit: Payer: Medicare Other | Admitting: Family Medicine

## 2019-08-21 ENCOUNTER — Ambulatory Visit (INDEPENDENT_AMBULATORY_CARE_PROVIDER_SITE_OTHER): Payer: Medicare Other | Admitting: Licensed Clinical Social Worker

## 2019-08-21 DIAGNOSIS — K219 Gastro-esophageal reflux disease without esophagitis: Secondary | ICD-10-CM

## 2019-08-21 DIAGNOSIS — E785 Hyperlipidemia, unspecified: Secondary | ICD-10-CM

## 2019-08-21 DIAGNOSIS — F339 Major depressive disorder, recurrent, unspecified: Secondary | ICD-10-CM | POA: Diagnosis not present

## 2019-08-21 DIAGNOSIS — Z794 Long term (current) use of insulin: Secondary | ICD-10-CM

## 2019-08-21 DIAGNOSIS — E1159 Type 2 diabetes mellitus with other circulatory complications: Secondary | ICD-10-CM

## 2019-08-21 DIAGNOSIS — E1169 Type 2 diabetes mellitus with other specified complication: Secondary | ICD-10-CM | POA: Diagnosis not present

## 2019-08-21 DIAGNOSIS — I10 Essential (primary) hypertension: Secondary | ICD-10-CM

## 2019-08-21 DIAGNOSIS — I152 Hypertension secondary to endocrine disorders: Secondary | ICD-10-CM

## 2019-08-21 NOTE — Patient Instructions (Signed)
Licensed Clinical Social Worker Visit Information  Goals we discussed today:  Goals        . Client stated: "I want help in managing my depression" (pt-stated)     Current Barriers:  [name] Mental Health Concerns   . Financial challenges . Social isolation occasionally  Clinical Social Work Clinical Goal(s):  [name] Over the next 30 days, client will work with LCSW to address depression issues of client and managing depression symptoms  Interventions: . Talked previously with client about family stress issues . Previously encouraged client to talk with RN CM regarding nursing needs of client . Talked previously with client about depression issues of client and managing depression symptoms . Previously talked with client about managing health issues faced by client . Previously talked with client about social support network of client  Patient Self Care Activities:  . Self administers medications as prescribed . Attends all scheduled provider appointments  . Cares for pet dog as means of relaxation  Plan  LCSW to call client in next 3 weeks to discuss client management of depression symptoms of client Client to attend scheduled medical appointments Client to communicate with RN CM related to nursing issues of client Client to use relaxation techniques to help her manage depression symptoms  Please see past updates related to this goal by clicking on the "Past Updates" button in the selected goal         Materials Provided: No  Follow Up Plan:LCSW to call client in next 3 weeks to discuss client management of depression symptoms of client    The patient verbalized understanding of instructions provided today and declined a print copy of patient instruction materials.   [name].Marven Veley MSW, LCSW Licensed Clinical Social Worker [facility] [phone]

## 2019-08-21 NOTE — Chronic Care Management (AMB) (Signed)
Care Management Note   Ashley Watts is a 58 y.o. year old female who is a primary care patient of [name], Connye Burkitt, [name]. The CM team was consulted for assistance with chronic disease management and care coordination.   I reached out to Ashley Watts by phone today.   Review of patient status, including review of consultants reports, relevant laboratory and other test results, and collaboration with appropriate care team members and the patient's provider was performed as part of comprehensive patient evaluation and provision of chronic care management services.   Social Determinants of Health: risk of social isolation; risk of depression; risk of stress    Office Visit from 05/10/2019 in [place]  PHQ-9 Total Score  5     Medications   New medications from outside sources are available for reconciliation   aspirin EC 81 MG EC tablet    blood glucose meter kit and supplies    Blood Glucose Monitoring Suppl (ONETOUCH VERIO) w/Device KIT    carvedilol (COREG) 12.5 MG tablet    CYMBALTA 30 MG capsule    fluticasone (FLONASE) 50 MCG/ACT nasal spray    furosemide (LASIX) 40 MG tablet    GLUCOPHAGE 1000 MG tablet    glucose blood (ACCU-CHEK AVIVA) test strip    Lancets (ACCU-CHEK SOFT TOUCH) lancets    LANTUS SOLOSTAR 100 UNIT/ML Solostar Pen    levothyroxine (SYNTHROID) 125 MCG tablet    lisinopril (ZESTRIL) 40 MG tablet    pantoprazole (PROTONIX) 40 MG tablet    simvastatin (ZOCOR) 10 MG tablet    traMADol (ULTRAM) 50 MG tablet    Goals:       . Client stated: "I want help in managing my depression" (pt-stated)     Current Barriers:  Ashley Watts Mental Health Concerns   . Financial challenges . Social isolation occasionally  Clinical Social Work Clinical Goal(s):  Ashley Watts Over the next 30 days, client will work with LCSW to address depression issues of client and managing depression symptoms  Interventions: . Previously talked with client about family stress  issues . Previously encouraged client to talk with RN CM regarding nursing needs of client . Previously talked with client about depression issues of client and managing depression symptoms . Previously talked with client about managing health issues faced by client . Talked previously with client about social support network of client  Patient Self Care Activities:  . Self administers medications as prescribed . Attends all scheduled provider appointments  . Cares for pet dog as means of relaxation  Plan  LCSW to call client in next 3 weeks to discuss client management of depression symptoms of client Client to attend scheduled medical appointments Client to communicate with RN CM related to nursing issues of client Client to use relaxation techniques to help her manage depression symptoms  Please see past updates related to this goal by clicking on the "Past Updates" button in the selected goal       Client has prescribed medications and is taking medications as prescribed. She spoke previously of pain issues faced. She is taking a prescribed pain medication. She said she is walking well.  She said she is sleeping better now.  She said she is eating well. She likes to walk her dog for relaxation.  She likes listening to music to relax. She said she enjoys talking with her sister for social support.  LCSW has encouraged Ashley Watts to call RNCM as needed for nursing support.  Follow Up Plan: LCSW to call client in next 3 weeks to discuss client management of depression symptoms  Ashley Watts.Ashley Watts MSW, LCSW Licensed Clinical Social Worker [facility] [phone]

## 2019-08-22 DIAGNOSIS — M542 Cervicalgia: Secondary | ICD-10-CM | POA: Diagnosis not present

## 2019-08-22 DIAGNOSIS — Z79899 Other long term (current) drug therapy: Secondary | ICD-10-CM | POA: Diagnosis not present

## 2019-08-22 DIAGNOSIS — E1165 Type 2 diabetes mellitus with hyperglycemia: Secondary | ICD-10-CM | POA: Diagnosis not present

## 2019-08-22 DIAGNOSIS — I10 Essential (primary) hypertension: Secondary | ICD-10-CM | POA: Diagnosis not present

## 2019-09-10 ENCOUNTER — Other Ambulatory Visit: Payer: Self-pay | Admitting: Family Medicine

## 2019-09-10 DIAGNOSIS — E1159 Type 2 diabetes mellitus with other circulatory complications: Secondary | ICD-10-CM

## 2019-09-10 DIAGNOSIS — Z794 Long term (current) use of insulin: Secondary | ICD-10-CM

## 2019-09-12 ENCOUNTER — Ambulatory Visit (INDEPENDENT_AMBULATORY_CARE_PROVIDER_SITE_OTHER): Payer: Medicare Other | Admitting: Licensed Clinical Social Worker

## 2019-09-12 DIAGNOSIS — F339 Major depressive disorder, recurrent, unspecified: Secondary | ICD-10-CM | POA: Diagnosis not present

## 2019-09-12 DIAGNOSIS — E1169 Type 2 diabetes mellitus with other specified complication: Secondary | ICD-10-CM

## 2019-09-12 DIAGNOSIS — Z794 Long term (current) use of insulin: Secondary | ICD-10-CM

## 2019-09-12 DIAGNOSIS — E785 Hyperlipidemia, unspecified: Secondary | ICD-10-CM

## 2019-09-12 DIAGNOSIS — I10 Essential (primary) hypertension: Secondary | ICD-10-CM

## 2019-09-12 DIAGNOSIS — E1159 Type 2 diabetes mellitus with other circulatory complications: Secondary | ICD-10-CM

## 2019-09-12 DIAGNOSIS — K219 Gastro-esophageal reflux disease without esophagitis: Secondary | ICD-10-CM

## 2019-09-12 NOTE — Patient Instructions (Addendum)
Licensed Clinical Social Worker Visit Information  Goals we discussed today:  Goals        . Client stated: "I want help in managing my depression" (pt-stated)     Current Barriers:  [name] Mental Health Concerns   . Financial challenges . Social isolation occasionally  Clinical Social Work Clinical Goal(s):  [name] Over the next 30 days, client will work with LCSW to address depression issues of client and managing depression symptoms  Interventions: . Talked with client about family stress issues . Encouraged client to talk with RN CM regarding nursing needs of client . Talked with client about depression issues of client and managing depression symptoms . Talked with client about managing health issues faced by client . Talked with client about social support network of client  . Provided counseling support for client  Patient Self Care Activities:  . Self administers medications as prescribed . Attends all scheduled provider appointments  . Cares for pet dog as means of relaxation  Plan  LCSW to call client in next 3 weeks to discuss client management of depression symptoms of client Client to attend scheduled medical appointments Client to communicate with RN CM related to nursing issues of client Client to use relaxation techniques to help her manage depression symptoms  Please see past updates related to this goal by clicking on the "Past Updates" button in the selected goal         Materials Provided: No  Follow Up Plan: LCSW to call client in next 3 weeks to discuss client management of depression symptoms of client  The patient verbalized understanding of instructions provided today and declined a print copy of patient instruction materials.   [name].Chauncey Sciulli MSW, LCSW Licensed Clinical Social Worker [facility] Care [facility] [phone]

## 2019-09-12 NOTE — Chronic Care Management (AMB) (Signed)
  Care Management Note   Ashley Watts is a 58 y.o. year old female who is a primary care patient of [name], Connye Burkitt, [name]. The CM team was consulted for assistance with chronic disease management and care coordination.   I reached out to Thressa Sheller by phone today.     Review of patient status, including review of consultants reports, relevant laboratory and other test results, and collaboration with appropriate care team members and the patient's provider was performed as part of comprehensive patient evaluation and provision of chronic care management services.   Social Determinants of health: risk of social isolation; risk of depression ; risk of stress    Office Visit from 05/10/2019 in [place]  PHQ-9 Total Score  5      Medications   New medications from outside sources are available for reconciliation   aspirin EC 81 MG EC tablet    blood glucose meter kit and supplies    Blood Glucose Monitoring Suppl (ONETOUCH VERIO) w/Device KIT    carvedilol (COREG) 12.5 MG tablet    CYMBALTA 30 MG capsule    fluticasone (FLONASE) 50 MCG/ACT nasal spray    furosemide (LASIX) 40 MG tablet    GLUCOPHAGE 1000 MG tablet    glucose blood (ACCU-CHEK AVIVA) test strip    Lancets (ACCU-CHEK SOFT TOUCH) lancets    LANTUS SOLOSTAR 100 UNIT/ML Solostar Pen    levothyroxine (SYNTHROID) 125 MCG tablet    lisinopril (ZESTRIL) 40 MG tablet    pantoprazole (PROTONIX) 40 MG tablet    simvastatin (ZOCOR) 10 MG tablet    traMADol (ULTRAM) 50 MG tablet     Goals        . Client stated: "I want help in managing my depression" (pt-stated)     Current Barriers:  [name] Mental Health Concerns   . Financial challenges . Social isolation occasionally  Clinical Social Work Clinical Goal(s):  [name] Over the next 30 days, client will work with LCSW to address depression issues of client and managing depression symptoms  Interventions: . Talked with client about family stress  issues . Encouraged client to talk with RN CM regarding nursing needs of client . Talked with client about depression issues of client and managing depression symptoms . Talked with client about managing health issues faced by client . Talked with client about social support network of client . Provided counseling support for client  Patient Self Care Activities:  . Self administers medications as prescribed . Attends all scheduled provider appointments  . Cares for pet dog as means of relaxation  Plan  LCSW to call client in next 3 weeks to discuss client management of depression symptoms of client Client to attend scheduled medical appointments Client to communicate with RN CM related to nursing issues of client Client to use relaxation techniques to help her manage depression symptoms  Please see past updates related to this goal by clicking on the "Past Updates" button in the selected goal        Follow Up Plan: LCSW to call client in next 3 weeks to discuss client management of depression symptoms  [name].Suheyb Raucci MSW, LCSW Licensed Clinical Social Worker [facility] [phone]

## 2019-09-20 DIAGNOSIS — E039 Hypothyroidism, unspecified: Secondary | ICD-10-CM | POA: Diagnosis not present

## 2019-09-20 DIAGNOSIS — M545 Low back pain: Secondary | ICD-10-CM | POA: Diagnosis not present

## 2019-09-20 DIAGNOSIS — E7800 Pure hypercholesterolemia, unspecified: Secondary | ICD-10-CM | POA: Diagnosis not present

## 2019-09-20 DIAGNOSIS — Z1159 Encounter for screening for other viral diseases: Secondary | ICD-10-CM | POA: Diagnosis not present

## 2019-09-20 DIAGNOSIS — I10 Essential (primary) hypertension: Secondary | ICD-10-CM | POA: Diagnosis not present

## 2019-09-20 DIAGNOSIS — M542 Cervicalgia: Secondary | ICD-10-CM | POA: Diagnosis not present

## 2019-09-20 DIAGNOSIS — E1165 Type 2 diabetes mellitus with hyperglycemia: Secondary | ICD-10-CM | POA: Diagnosis not present

## 2019-10-03 ENCOUNTER — Ambulatory Visit: Payer: Self-pay | Admitting: Licensed Clinical Social Worker

## 2019-10-03 DIAGNOSIS — K219 Gastro-esophageal reflux disease without esophagitis: Secondary | ICD-10-CM

## 2019-10-03 DIAGNOSIS — Z794 Long term (current) use of insulin: Secondary | ICD-10-CM

## 2019-10-03 DIAGNOSIS — E785 Hyperlipidemia, unspecified: Secondary | ICD-10-CM

## 2019-10-03 DIAGNOSIS — E1169 Type 2 diabetes mellitus with other specified complication: Secondary | ICD-10-CM

## 2019-10-03 DIAGNOSIS — E1159 Type 2 diabetes mellitus with other circulatory complications: Secondary | ICD-10-CM

## 2019-10-03 DIAGNOSIS — I152 Hypertension secondary to endocrine disorders: Secondary | ICD-10-CM

## 2019-10-03 DIAGNOSIS — F339 Major depressive disorder, recurrent, unspecified: Secondary | ICD-10-CM

## 2019-10-03 DIAGNOSIS — I10 Essential (primary) hypertension: Secondary | ICD-10-CM

## 2019-10-03 NOTE — Patient Instructions (Signed)
Licensed Clinical Social Worker Visit Information  Goals we discussed today:  Goals        . Client stated: "I want help in managing my depression" (pt-stated)     Current Barriers:  [name] Mental Health Concerns   . Financial challenges . Social isolation occasionally  Clinical Social Work Clinical Goal(s):  [name] Over the next 30 days, client will work with LCSW to address depression issues of client and managing depression symptoms  Interventions: . Talked previously with client about family stress issues . Encouraged client previously to talk with RN CM regarding nursing needs of client . Talked previously with client about depression issues of client and managing depression symptoms . Previously talked with client about social support network of client  Patient Self Care Activities:  . Self administers medications as prescribed . Attends all scheduled provider appointments  . Cares for pet dog as means of relaxation  Plan   LCSW to call client in next 3 weeks to discuss client management of depression symptoms of client Client to attend scheduled medical appointments Client to communicate with RN CM related to nursing issues of client Client to use relaxation techniques to help her manage depression symptoms  Please see past updates related to this goal by clicking on the "Past Updates" button in the selected goal         Materials Provided:  No  Follow Up Plan: LCSW to call client in next 3 weeks to discuss client management of  depression symptoms of client  The patient verbalized understanding of instructions provided today and declined a print copy of patient instruction materials.   [name].Vinessa Macconnell MSW, LCSW Licensed Clinical Social Worker [facility] [phone]

## 2019-10-03 NOTE — Chronic Care Management (AMB) (Signed)
  Care Management Note   Ashley Watts is a 58 y.o. year old female who is a primary care patient of [name], Connye Burkitt, [name]. The CM team was consulted for assistance with chronic disease management and care coordination.   I reached out to Thressa Sheller by phone today   Review of patient status, including review of consultants reports, relevant laboratory and other test results, and collaboration with appropriate care team members and the patient's provider was performed as part of comprehensive patient evaluation and provision of chronic care management services.   Social determinants of health: risk of social isolation; risk of stress; risk of physical inactivity    Office Visit from 05/10/2019 in [place]  PHQ-9 Total Score  5     Goals        . Client stated: "I want help in managing my depression" (pt-stated)     Current Barriers:  [name] Mental Health Concerns   . Financial challenges . Social isolation occasionally  Clinical Social Work Clinical Goal(s):  [name] Over the next 30 days, client will work with LCSW to address depression issues of client and managing depression symptoms  Interventions: . Talked previously with client about family stress issues . Previously encouraged client to talk with RN CM regarding nursing needs of client . Previously talked with client about depression issues of client and managing depression symptoms . Previously talked with client about social support network of client  Patient Self Care Activities:  . Self administers medications as prescribed . Attends all scheduled provider appointments  . Cares for pet dog as means of relaxation  Plan  LCSW to call client in next 3 weeks to discuss client management of depression symptoms of client Client to attend scheduled medical appointments Client to communicate with RN CM related to nursing issues of client Client to use relaxation techniques to help her manage  depression symptoms  Please see past updates related to this goal by clicking on the "Past Updates" button in the selected goal       Follow Up Plan: LCSW to call client in next 3 weeks to discuss her management of depression symptoms faced  [name].Rita Prom MSW, LCSW Licensed Clinical Social Worker [facility] [phone]

## 2019-10-04 DIAGNOSIS — M542 Cervicalgia: Secondary | ICD-10-CM | POA: Diagnosis not present

## 2019-10-04 DIAGNOSIS — E039 Hypothyroidism, unspecified: Secondary | ICD-10-CM | POA: Diagnosis not present

## 2019-10-04 DIAGNOSIS — E119 Type 2 diabetes mellitus without complications: Secondary | ICD-10-CM | POA: Diagnosis not present

## 2019-10-04 DIAGNOSIS — E7800 Pure hypercholesterolemia, unspecified: Secondary | ICD-10-CM | POA: Diagnosis not present

## 2019-10-12 ENCOUNTER — Other Ambulatory Visit: Payer: Self-pay | Admitting: Family Medicine

## 2019-10-12 DIAGNOSIS — Z794 Long term (current) use of insulin: Secondary | ICD-10-CM

## 2019-10-12 DIAGNOSIS — E1159 Type 2 diabetes mellitus with other circulatory complications: Secondary | ICD-10-CM

## 2019-10-14 NOTE — Telephone Encounter (Signed)
[name]. NTBS. Refill sent to pharmacy. Last A1C & OV June

## 2019-10-18 DIAGNOSIS — M25512 Pain in left shoulder: Secondary | ICD-10-CM | POA: Diagnosis not present

## 2019-10-18 DIAGNOSIS — M545 Low back pain: Secondary | ICD-10-CM | POA: Diagnosis not present

## 2019-10-18 DIAGNOSIS — E119 Type 2 diabetes mellitus without complications: Secondary | ICD-10-CM | POA: Diagnosis not present

## 2019-10-18 DIAGNOSIS — M542 Cervicalgia: Secondary | ICD-10-CM | POA: Diagnosis not present

## 2019-10-18 DIAGNOSIS — Z79899 Other long term (current) drug therapy: Secondary | ICD-10-CM | POA: Diagnosis not present

## 2019-10-18 DIAGNOSIS — M19012 Primary osteoarthritis, left shoulder: Secondary | ICD-10-CM | POA: Diagnosis not present

## 2019-10-24 ENCOUNTER — Ambulatory Visit (INDEPENDENT_AMBULATORY_CARE_PROVIDER_SITE_OTHER): Payer: Medicare Other | Admitting: Licensed Clinical Social Worker

## 2019-10-24 DIAGNOSIS — E1169 Type 2 diabetes mellitus with other specified complication: Secondary | ICD-10-CM | POA: Diagnosis not present

## 2019-10-24 DIAGNOSIS — E785 Hyperlipidemia, unspecified: Secondary | ICD-10-CM

## 2019-10-24 DIAGNOSIS — Z794 Long term (current) use of insulin: Secondary | ICD-10-CM

## 2019-10-24 DIAGNOSIS — I152 Hypertension secondary to endocrine disorders: Secondary | ICD-10-CM

## 2019-10-24 DIAGNOSIS — F339 Major depressive disorder, recurrent, unspecified: Secondary | ICD-10-CM | POA: Diagnosis not present

## 2019-10-24 DIAGNOSIS — E1159 Type 2 diabetes mellitus with other circulatory complications: Secondary | ICD-10-CM

## 2019-10-24 DIAGNOSIS — I10 Essential (primary) hypertension: Secondary | ICD-10-CM

## 2019-10-24 DIAGNOSIS — K219 Gastro-esophageal reflux disease without esophagitis: Secondary | ICD-10-CM

## 2019-10-24 NOTE — Chronic Care Management (AMB) (Signed)
Care Management Note   Ashley Watts is a 58 y.o. year old female who is a primary care patient of [name], Connye Burkitt, [name]. The CM team was consulted for assistance with chronic disease management and care coordination.   I reached out to Ashley Watts, son of client, by phone today.  Phone number for Ashley Watts , son of client, was not a current phone number for Ashley Watts. Number for client was not connected. LCSW was not able thus to speak with client or son Ashley Watts. Also , LCSW had no way to leave phone message for client or for son, Ashley Watts  Review of patient status, including review of consultants reports, relevant laboratory and other test results, and collaboration with appropriate care team members and the patient's provider was performed as part of comprehensive patient evaluation and provision of chronic care management services.   Social determinants of health: risk of social isolation; risk of stress; risk of physical inactivity    Office Visit from 05/10/2019 in [place]  PHQ-9 Total Score  5     Medications   New medications from outside sources are available for reconciliation   aspirin EC 81 MG EC tablet    blood glucose meter kit and supplies    Blood Glucose Monitoring Suppl (ONETOUCH VERIO) w/Device KIT    carvedilol (COREG) 12.5 MG tablet    CYMBALTA 30 MG capsule    fluticasone (FLONASE) 50 MCG/ACT nasal spray    furosemide (LASIX) 40 MG tablet    GLUCOPHAGE 1000 MG tablet    glucose blood (ACCU-CHEK AVIVA) test strip    Lancets (ACCU-CHEK SOFT TOUCH) lancets    LANTUS SOLOSTAR 100 UNIT/ML Solostar Pen    levothyroxine (SYNTHROID) 125 MCG tablet    lisinopril (ZESTRIL) 40 MG tablet    pantoprazole (PROTONIX) 40 MG tablet    simvastatin (ZOCOR) 10 MG tablet    traMADol (ULTRAM) 50 MG tablet      GOAL:  . Client stated: "I want help in managing my depression" (pt-stated)     Current Barriers:  [name] Mental Health Concerns    . Financial challenges . Social isolation occasionally  Clinical Social Work Clinical Goal(s):  [name] Over the next 30 days, client will work with LCSW to address depression issues of client and managing depression symptoms  Interventions: . Previously talked with client about family stress issues . Previously encouraged client to talk with RN CM regarding nursing needs of client . Talked with client previously about depression issues of client and managing depression symptoms . Talked with client previously about managing health issues faced by client . Talked with client previously about social support network of client  Patient Self Care Activities:  . Self administers medications as prescribed . Attends all scheduled provider appointments  . Cares for pet dog as means of relaxation  Plan  LCSW to call client in next 3 weeks to discuss client management of depression symptoms of client Client to attend scheduled medical appointments Client to communicate with RN CM related to nursing issues of client Client to use relaxation techniques to help her manage depression symptoms  Please see past updates related to this goal by clicking on the "Past Updates" button in the selected goal      Follow Up Plan: LCSW to call client in next 3 weeks to discuss client management of depression symptoms of client  [name].Jacksen Isip MSW, LCSW Licensed Clinical Social Worker [facility]  Care Management [phone]

## 2019-10-24 NOTE — Patient Instructions (Addendum)
Licensed Clinical Social Worker Visit Information  Goals we discussed today:  Goals        . Client stated: "I want help in managing my depression" (pt-stated)     Current Barriers:  [name] Mental Health Concerns   . Financial challenges . Social isolation occasionally  Clinical Social Work Clinical Goal(s):  [name] Over the next 30 days, client will work with LCSW to address depression issues of client and managing depression symptoms  Interventions:  Previously talked with client about family stress issues  Previously encouraged client to talk with RN CM regarding nursing needs of client  Talked with client previously about depression issues of client and managing depression symptoms  Talked with client previously about managing health issues faced by client  Talked with client previously about social support network of client  Patient Self Care Activities:  . Self administers medications as prescribed . Attends all scheduled provider appointments  . Cares for pet dog as means of relaxation  Plan  LCSW to call client in next 3 weeks to discuss client management of depression symptoms of client Client to attend scheduled medical appointments Client to communicate with RN CM related to nursing issues of client Client to use relaxation techniques to help her manage depression symptoms  Please see past updates related to this goal by clicking on the "Past Updates" button in the selected goal         Materials Provided: No  Follow Up Plan: LCSW to call client in next 3 weeks to discuss client management of depression symptoms of client  LCSW was not able to speak via phone with client or son of client today. Thus the patient was not able to verbalize understanding of instructions provided today and was not able to accept or decline a print copy of patient instruction materials.   [name].Damaris Abeln MSW, LCSW Licensed Clinical Social Worker [facility]  Care Management [phone]

## 2019-11-12 ENCOUNTER — Other Ambulatory Visit: Payer: Self-pay | Admitting: Family Medicine

## 2019-11-12 DIAGNOSIS — F339 Major depressive disorder, recurrent, unspecified: Secondary | ICD-10-CM

## 2019-11-12 DIAGNOSIS — G8929 Other chronic pain: Secondary | ICD-10-CM

## 2019-11-12 DIAGNOSIS — E1159 Type 2 diabetes mellitus with other circulatory complications: Secondary | ICD-10-CM

## 2019-11-12 DIAGNOSIS — Z794 Long term (current) use of insulin: Secondary | ICD-10-CM

## 2019-11-18 ENCOUNTER — Ambulatory Visit (INDEPENDENT_AMBULATORY_CARE_PROVIDER_SITE_OTHER): Payer: Medicare Other | Admitting: Licensed Clinical Social Worker

## 2019-11-18 DIAGNOSIS — I10 Essential (primary) hypertension: Secondary | ICD-10-CM | POA: Diagnosis not present

## 2019-11-18 DIAGNOSIS — E1159 Type 2 diabetes mellitus with other circulatory complications: Secondary | ICD-10-CM

## 2019-11-18 DIAGNOSIS — E785 Hyperlipidemia, unspecified: Secondary | ICD-10-CM | POA: Diagnosis not present

## 2019-11-18 DIAGNOSIS — F339 Major depressive disorder, recurrent, unspecified: Secondary | ICD-10-CM | POA: Diagnosis not present

## 2019-11-18 DIAGNOSIS — M542 Cervicalgia: Secondary | ICD-10-CM | POA: Diagnosis not present

## 2019-11-18 DIAGNOSIS — M25512 Pain in left shoulder: Secondary | ICD-10-CM | POA: Diagnosis not present

## 2019-11-18 DIAGNOSIS — Z79899 Other long term (current) drug therapy: Secondary | ICD-10-CM | POA: Diagnosis not present

## 2019-11-18 DIAGNOSIS — E1169 Type 2 diabetes mellitus with other specified complication: Secondary | ICD-10-CM | POA: Diagnosis not present

## 2019-11-18 DIAGNOSIS — M545 Low back pain: Secondary | ICD-10-CM | POA: Diagnosis not present

## 2019-11-18 DIAGNOSIS — E119 Type 2 diabetes mellitus without complications: Secondary | ICD-10-CM | POA: Diagnosis not present

## 2019-11-18 DIAGNOSIS — E559 Vitamin D deficiency, unspecified: Secondary | ICD-10-CM | POA: Diagnosis not present

## 2019-11-18 DIAGNOSIS — Z794 Long term (current) use of insulin: Secondary | ICD-10-CM

## 2019-11-18 DIAGNOSIS — I152 Hypertension secondary to endocrine disorders: Secondary | ICD-10-CM

## 2019-11-18 DIAGNOSIS — E7800 Pure hypercholesterolemia, unspecified: Secondary | ICD-10-CM | POA: Diagnosis not present

## 2019-11-18 DIAGNOSIS — Z1159 Encounter for screening for other viral diseases: Secondary | ICD-10-CM | POA: Diagnosis not present

## 2019-11-18 DIAGNOSIS — K219 Gastro-esophageal reflux disease without esophagitis: Secondary | ICD-10-CM

## 2019-11-18 DIAGNOSIS — E039 Hypothyroidism, unspecified: Secondary | ICD-10-CM | POA: Diagnosis not present

## 2019-11-18 NOTE — Chronic Care Management (AMB) (Signed)
  Care Management Note   Ashley Watts is a 58 y.o. year old female who is a primary care patient of [name], Connye Burkitt, [name]. The CM team was consulted for assistance with chronic disease management and care coordination.   I reached out to Ashley Watts by phone today.   Review of patient status, including review of consultants reports, relevant laboratory and other test results, and collaboration with appropriate care team members and the patient's provider was performed as part of comprehensive patient evaluation and provision of chronic care management services.   Social determinants of health: risk of social isolation; risk of depression ; risk of stress; risk of physical inactivity    Office Visit from 05/10/2019 in [place]  PHQ-9 Total Score  5     Medications   (very important)  New medications from outside sources are available for reconciliation  aspirin EC 81 MG EC tablet blood glucose meter kit and supplies Blood Glucose Monitoring Suppl (ONETOUCH VERIO) w/Device KIT carvedilol (COREG) 12.5 MG tablet DULoxetine (CYMBALTA) 30 MG capsule fluticasone (FLONASE) 50 MCG/ACT nasal spray furosemide (LASIX) 40 MG tablet GLUCOPHAGE 1000 MG tablet glucose blood (ACCU-CHEK AVIVA) test strip Insulin Glargine (LANTUS SOLOSTAR) 100 UNIT/ML Solostar Pen Lancets (ACCU-CHEK SOFT TOUCH) lancets levothyroxine (SYNTHROID) 125 MCG tablet lisinopril (ZESTRIL) 40 MG tablet pantoprazole (PROTONIX) 40 MG tablet simvastatin (ZOCOR) 10 MG tablet traMADol (ULTRAM) 50 MG tablet  Goals        . Client stated: "I want help in managing my depression" (pt-stated)     Current Barriers:  [name] Mental Health Concerns in patient with Depression, Type 2 DM, HTN , Hyperlipidemia, and GERD . Financial challenges . Social isolation occasionally  Clinical Social Work Clinical Goal(s):  [name] Over the next 30 days, client will work with LCSW to address depression issues of client  and managing depression symptoms  Interventions:  Previously talked with client about family stress issues  Previously encouraged client to talk with RN CM regarding nursing needs of client  Talked with client previously about depression issues of client and managing depression symptoms  Talked with client previously about managing health issues faced by client  Talked with client previously about social support network of client  Patient Self Care Activities:  . Self administers medications as prescribed . Attends all scheduled provider appointments  . Cares for pet dog as means of relaxation  Plan  LCSW to call client in next 4 weeks to discuss client management of depression symptoms of client Client to attend scheduled medical appointments Client to communicate with RN CM related to nursing issues of client Client to use relaxation techniques to help her manage depression symptoms  Please see past updates related to this goal by clicking on the "Past Updates" button in the selected goal       Follow Up Plan: LCSW to call client in next 4 weeks to talk with client about client management of depression symptoms faced  Ashley Watts MSW, LCSW Licensed Clinical Social Worker [facility] Care [facility] [phone]

## 2019-11-18 NOTE — Patient Instructions (Addendum)
Licensed Clinical Social Worker Visit Information  Goals we discussed today:  Goals        . Client stated: "I want help in managing my depression" (pt-stated)     Current Barriers:  [name] Mental Health Concerns in patient with chronic diagnoses of GERD, Depression, HTN, Hyperlipidemia and Type 2 DM . Financial challenges . Social isolation occasionally  Clinical Social Work Clinical Goal(s):  [name] Over the next 30 days, client will work with LCSW to address depression issues of client and managing depression symptoms  Interventions: . Previously talked with client about family stress issues . Encouraged client previously to talk with RN CM regarding nursing needs of client . Talked with client previously about depression issues of client and managing depression symptoms . Talked with client previously about managing health issues faced by client . Talked with client previously about social support network of client  Patient Self Care Activities:  . Self administers medications as prescribed . Attends all scheduled provider appointments  . Cares for pet dog as means of relaxation  Plan  LCSW to call client in next 4 weeks to discuss client management of depression symptoms of client Client to attend scheduled medical appointments Client to communicate with RN CM related to nursing issues of client Client to use relaxation techniques to help her manage depression symptoms  Please see past updates related to this goal by clicking on the "Past Updates" button in the selected goal         Materials Provided:  No  Follow Up Plan: LCSW to call client in next 4 weeks to discuss client management of depression symptoms of client  The patient verbalized understanding of instructions provided today and declined a print copy of patient instruction materials.   [name].Shonique Pelphrey MSW, LCSW Licensed Clinical Social Worker [facility] Care  Management [phone]

## 2019-12-18 ENCOUNTER — Ambulatory Visit (INDEPENDENT_AMBULATORY_CARE_PROVIDER_SITE_OTHER): Payer: Medicare Other | Admitting: Licensed Clinical Social Worker

## 2019-12-18 DIAGNOSIS — F339 Major depressive disorder, recurrent, unspecified: Secondary | ICD-10-CM

## 2019-12-18 DIAGNOSIS — E1159 Type 2 diabetes mellitus with other circulatory complications: Secondary | ICD-10-CM

## 2019-12-18 DIAGNOSIS — E785 Hyperlipidemia, unspecified: Secondary | ICD-10-CM

## 2019-12-18 DIAGNOSIS — K219 Gastro-esophageal reflux disease without esophagitis: Secondary | ICD-10-CM

## 2019-12-18 DIAGNOSIS — E1169 Type 2 diabetes mellitus with other specified complication: Secondary | ICD-10-CM | POA: Diagnosis not present

## 2019-12-18 DIAGNOSIS — I10 Essential (primary) hypertension: Secondary | ICD-10-CM

## 2019-12-18 DIAGNOSIS — Z794 Long term (current) use of insulin: Secondary | ICD-10-CM

## 2019-12-18 NOTE — Chronic Care Management (AMB) (Signed)
  Care Management Note   Ashley Watts is a 59 y.o. year old female who is a primary care patient of [name], Connye Burkitt, [name]. The CM team was consulted for assistance with chronic disease management and care coordination.   I reached out to Thressa Sheller by phone today.   Review of patient status, including review of consultants reports, relevant laboratory and other test results, and collaboration with appropriate care team members and the patient's provider was performed as part of comprehensive patient evaluation and provision of chronic care management services.   Social determinants of health: risk of social isolation; risk of depression; risk of stress; risk of physical inactivity    Office Visit from 05/10/2019 in [place]  PHQ-9 Total Score  5     Medications   (very important)  New medications from outside sources are available for reconciliation  aspirin EC 81 MG EC tablet blood glucose meter kit and supplies Blood Glucose Monitoring Suppl (ONETOUCH VERIO) w/Device KIT carvedilol (COREG) 12.5 MG tablet DULoxetine (CYMBALTA) 30 MG capsule fluticasone (FLONASE) 50 MCG/ACT nasal spray furosemide (LASIX) 40 MG tablet GLUCOPHAGE 1000 MG tablet glucose blood (ACCU-CHEK AVIVA) test strip Insulin Glargine (LANTUS SOLOSTAR) 100 UNIT/ML Solostar Pen Lancets (ACCU-CHEK SOFT TOUCH) lancets levothyroxine (SYNTHROID) 125 MCG tablet lisinopril (ZESTRIL) 40 MG tablet pantoprazole (PROTONIX) 40 MG tablet simvastatin (ZOCOR) 10 MG tablet traMADol (ULTRAM) 50 MG tablet  Goals        . Client stated: "I want help in managing my depression" (pt-stated)     Current Barriers:  [name] Mental Health Concerns in patient with Chronic Diagnoses of Depression, Diabetes, HTN, HLD, GERD . Financial challenges . Social isolation occasionally  Clinical Social Work Clinical Goal(s):  [name] Over the next 30 days, client will work with LCSW to address depression issues of client  and managing depression symptoms  Interventions: . Talked with client about family stress issues . Encouraged client to talk with RN CM regarding nursing needs of client . Previously talked with client about depression issues of client and managing depression symptoms . Previously talked with client about managing health issues faced by client . Talked with client about social support network of client . Talked with client about pain issues faced by client  Patient Self Care Activities:  . Self administers medications as prescribed . Attends all scheduled provider appointments  . Cares for pet dog as means of relaxation  Plan  LCSW to call client in next 4 weeks to discuss client management of depression symptoms of client Client to attend scheduled medical appointments Client to communicate with RN CM related to nursing issues of client Client to use relaxation techniques to help her manage depression symptoms  Please see past updates related to this goal by clicking on the "Past Updates" button in the selected goal       Follow Up Plan: LCSW to call client in next 4 weeks to discuss client management of depression symptoms faced  [name].Delsy Etzkorn MSW, LCSW Licensed Clinical Social Worker [facility] Care Management [phone]

## 2019-12-18 NOTE — Patient Instructions (Signed)
Licensed Clinical Social Worker Visit Information  Goals we discussed today:  Goals        . Client stated: "I want help in managing my depression" (pt-stated)     Current Barriers:  [name] Mental Health Concerns in patient with Chronic Diagnoses of Depression, Diabetes, HTN, HLD, GERD . Financial challenges . Social isolation occasionally  Clinical Social Work Clinical Goal(s):  [name] Over the next 30 days, client will work with LCSW to address depression issues of client and managing depression symptoms  Interventions: . Talked with client about family stress issues . Encouraged client to talk with RN CM regarding nursing needs of client . Previously talked with client about depression issues of client and managing depression symptoms . Previously talked with client about managing health issues faced by client . Talked with client about social support network of client  Patient Self Care Activities:  . Self administers medications as prescribed . Attends all scheduled provider appointments  . Cares for pet dog as means of relaxation  Plan  LCSW to call client in next 4 weeks to discuss client management of depression symptoms of client Client to attend scheduled medical appointments Client to communicate with RN CM related to nursing issues of client Client to use relaxation techniques to help her manage depression symptoms  Please see past updates related to this goal by clicking on the "Past Updates" button in the selected goal         Materials Provided: No  Follow Up Plan: LCSW to call client in next 4 weeks to discuss client management of depression symptoms of client  The patient verbalized understanding of instructions provided today and declined a print copy of patient instruction materials.   [name].Yosselyn Tax MSW, LCSW Licensed Clinical Social Worker [facility] [phone]

## 2019-12-24 ENCOUNTER — Other Ambulatory Visit: Payer: Self-pay | Admitting: Family Medicine

## 2019-12-24 DIAGNOSIS — Z794 Long term (current) use of insulin: Secondary | ICD-10-CM

## 2019-12-24 DIAGNOSIS — E1159 Type 2 diabetes mellitus with other circulatory complications: Secondary | ICD-10-CM

## 2019-12-25 ENCOUNTER — Other Ambulatory Visit: Payer: Self-pay | Admitting: *Deleted

## 2019-12-25 DIAGNOSIS — E1159 Type 2 diabetes mellitus with other circulatory complications: Secondary | ICD-10-CM

## 2019-12-25 DIAGNOSIS — Z794 Long term (current) use of insulin: Secondary | ICD-10-CM

## 2019-12-25 MED ORDER — GLUCOSE BLOOD VI STRP: ORAL_STRIP | 3 refills | Status: AC

## 2019-12-26 DIAGNOSIS — Z79899 Other long term (current) drug therapy: Secondary | ICD-10-CM | POA: Diagnosis not present

## 2019-12-26 DIAGNOSIS — E039 Hypothyroidism, unspecified: Secondary | ICD-10-CM | POA: Diagnosis not present

## 2019-12-26 DIAGNOSIS — M545 Low back pain: Secondary | ICD-10-CM | POA: Diagnosis not present

## 2019-12-26 DIAGNOSIS — E1165 Type 2 diabetes mellitus with hyperglycemia: Secondary | ICD-10-CM | POA: Diagnosis not present

## 2019-12-26 DIAGNOSIS — E559 Vitamin D deficiency, unspecified: Secondary | ICD-10-CM | POA: Diagnosis not present

## 2020-01-07 DIAGNOSIS — M545 Low back pain: Secondary | ICD-10-CM | POA: Diagnosis not present

## 2020-01-07 DIAGNOSIS — E1165 Type 2 diabetes mellitus with hyperglycemia: Secondary | ICD-10-CM | POA: Diagnosis not present

## 2020-01-07 DIAGNOSIS — M19012 Primary osteoarthritis, left shoulder: Secondary | ICD-10-CM | POA: Diagnosis not present

## 2020-01-07 DIAGNOSIS — L989 Disorder of the skin and subcutaneous tissue, unspecified: Secondary | ICD-10-CM | POA: Diagnosis not present

## 2020-01-07 DIAGNOSIS — R928 Other abnormal and inconclusive findings on diagnostic imaging of breast: Secondary | ICD-10-CM | POA: Diagnosis not present

## 2020-01-07 DIAGNOSIS — Z79899 Other long term (current) drug therapy: Secondary | ICD-10-CM | POA: Diagnosis not present

## 2020-01-07 DIAGNOSIS — Z780 Asymptomatic menopausal state: Secondary | ICD-10-CM | POA: Diagnosis not present

## 2020-01-16 ENCOUNTER — Ambulatory Visit (INDEPENDENT_AMBULATORY_CARE_PROVIDER_SITE_OTHER): Payer: Medicare Other | Admitting: Licensed Clinical Social Worker

## 2020-01-16 DIAGNOSIS — E1159 Type 2 diabetes mellitus with other circulatory complications: Secondary | ICD-10-CM

## 2020-01-16 DIAGNOSIS — E785 Hyperlipidemia, unspecified: Secondary | ICD-10-CM | POA: Diagnosis not present

## 2020-01-16 DIAGNOSIS — F339 Major depressive disorder, recurrent, unspecified: Secondary | ICD-10-CM | POA: Diagnosis not present

## 2020-01-16 DIAGNOSIS — Z794 Long term (current) use of insulin: Secondary | ICD-10-CM

## 2020-01-16 DIAGNOSIS — I10 Essential (primary) hypertension: Secondary | ICD-10-CM

## 2020-01-16 DIAGNOSIS — E1169 Type 2 diabetes mellitus with other specified complication: Secondary | ICD-10-CM | POA: Diagnosis not present

## 2020-01-16 DIAGNOSIS — I152 Hypertension secondary to endocrine disorders: Secondary | ICD-10-CM

## 2020-01-16 NOTE — Patient Instructions (Addendum)
Licensed Clinical Social Worker Visit Information  Goals we discussed today:  Goals            . Client stated: "I want help in managing my depression" (pt-stated)     Current Barriers:  [name] Mental Health Concerns in patient with Chronic Diagnoses of Depression, Diabetes, HTN, HLD, GERD . Financial challenges . Social isolation occasionally  Clinical Social Work Clinical Goal(s):  [name] Over the next 30 days, client will work with LCSW to address depression issues of client and managing depression symptoms  Interventions: Previously talked with client about family stress issues  Previously encouraged client to talk with RN CM regarding nursing needs of client  Previously talked with client about depression issues of client and managing depression symptoms  Previously talked with client about managing health issues faced by client  Talked previously with client about social support network of client  Talked previously with client about pain issues faced by client  Patient Self Care Activities:  . Self administers medications as prescribed . Attends all scheduled provider appointments  . Cares for pet dog as means of relaxation  Plan  LCSW to call client in next 4 weeks to discuss client management of depression symptoms of client Client to attend scheduled medical appointments Client to communicate with RN CM related to nursing issues of client Client to use relaxation techniques to help her manage depression symptoms  Please see past updates related to this goal by clicking on the "Past Updates" button in the selected goal         Materials Provided:  No   Follow Up Plan:  LCSW to call client in next 4 weeks to discuss client management of depression symptoms of client  The patient verbalized understanding of instructions provided today and declined a print copy of patient instruction materials.   [name].Rani Sisney MSW, LCSW Licensed Clinical Social Worker [facility] [phone]

## 2020-01-16 NOTE — Chronic Care Management (AMB) (Signed)
  Care Management Note   Ashley Watts is a 59 y.o. year old female who is a primary care patient of [name], Connye Burkitt, [name]. The CM team was consulted for assistance with chronic disease management and care coordination.   I reached out to Thressa Sheller by phone today.     Review of patient status, including review of consultants reports, relevant laboratory and other test results, and collaboration with appropriate care team members and the patient's provider was performed as part of comprehensive patient evaluation and provision of chronic care management services.   Social determinants of health:risk of social isolation; risk of depression; risk of stress; risk of physical inactivity    Office Visit from 05/10/2019 in [place]  PHQ-9 Total Score  5     Medications   (very important)  New medications from outside sources are available for reconciliation  aspirin EC 81 MG EC tablet blood glucose meter kit and supplies Blood Glucose Monitoring Suppl (ONETOUCH VERIO) w/Device KIT carvedilol (COREG) 12.5 MG tablet DULoxetine (CYMBALTA) 30 MG capsule fluticasone (FLONASE) 50 MCG/ACT nasal spray furosemide (LASIX) 40 MG tablet GLUCOPHAGE 1000 MG tablet glucose blood (ACCU-CHEK AVIVA) test strip Lancets (ACCU-CHEK SOFT TOUCH) lancets LANTUS SOLOSTAR 100 UNIT/ML Solostar Pen levothyroxine (SYNTHROID) 125 MCG tablet lisinopril (ZESTRIL) 40 MG tablet pantoprazole (PROTONIX) 40 MG tablet simvastatin (ZOCOR) 10 MG tablet traMADol (ULTRAM) 50 MG tablet  Goals            . Client stated: "I want help in managing my depression" (pt-stated)     Current Barriers:  [name] Mental Health Concerns in patient with Chronic Diagnoses of Depression, Diabetes, HTN, HLD, GERD . Financial challenges . Social isolation occasionally  Clinical Social Work Clinical Goal(s):  [name] Over the next 30 days, client will work with LCSW to address depression issues of client and managing  depression symptoms   Interventions:  Previously talked with client about family stress issues  Previously encouraged client to talk with RN CM regarding nursing needs of client  Previously talked with client about depression issues of client and managing depression symptoms  Previously talked with client about managing health issues faced by client  Talked previously with client about social support network of client . Talked previously with client about pain issues faced by client  Patient Self Care Activities:  . Self administers medications as prescribeends all scheduled provider appointments  . Cares for pet dog as means of relaxation  Plan  LCSW to call client in next 3 weeks to discuss client management of depression symptoms of client Client to attend scheduled medical appointments Client to communicate with RN CM related to nursing issues of client Client to use relaxation techniques to help her manage depression symptoms  Please see past updates related to this goal by clicking on the "Past Updates" button in the selected goal       Follow Up Plan: LCSW to call client in next 4 weeks to discuss client management of depression symptoms.  [name].Shikara Mcauliffe MSW, LCSW Licensed Clinical Social Worker [facility] [phone]

## 2020-01-20 DIAGNOSIS — L820 Inflamed seborrheic keratosis: Secondary | ICD-10-CM | POA: Diagnosis not present

## 2020-02-05 DIAGNOSIS — Z1159 Encounter for screening for other viral diseases: Secondary | ICD-10-CM | POA: Diagnosis not present

## 2020-02-05 DIAGNOSIS — E1165 Type 2 diabetes mellitus with hyperglycemia: Secondary | ICD-10-CM | POA: Diagnosis not present

## 2020-02-05 DIAGNOSIS — M545 Low back pain: Secondary | ICD-10-CM | POA: Diagnosis not present

## 2020-02-05 DIAGNOSIS — Z780 Asymptomatic menopausal state: Secondary | ICD-10-CM | POA: Diagnosis not present

## 2020-02-05 DIAGNOSIS — M19012 Primary osteoarthritis, left shoulder: Secondary | ICD-10-CM | POA: Diagnosis not present

## 2020-02-05 DIAGNOSIS — Z79899 Other long term (current) drug therapy: Secondary | ICD-10-CM | POA: Diagnosis not present

## 2020-02-12 ENCOUNTER — Ambulatory Visit: Payer: Self-pay | Admitting: Licensed Clinical Social Worker

## 2020-02-12 DIAGNOSIS — E785 Hyperlipidemia, unspecified: Secondary | ICD-10-CM

## 2020-02-12 DIAGNOSIS — E1159 Type 2 diabetes mellitus with other circulatory complications: Secondary | ICD-10-CM

## 2020-02-12 DIAGNOSIS — K219 Gastro-esophageal reflux disease without esophagitis: Secondary | ICD-10-CM

## 2020-02-12 DIAGNOSIS — E1169 Type 2 diabetes mellitus with other specified complication: Secondary | ICD-10-CM

## 2020-02-12 DIAGNOSIS — F339 Major depressive disorder, recurrent, unspecified: Secondary | ICD-10-CM

## 2020-02-12 NOTE — Patient Instructions (Addendum)
Licensed Clinical Social Worker Visit Information  Goals we discussed today:  Goals        . Client stated: "I want help in managing my depression" (pt-stated)     Current Barriers:  [name] Mental Health Concerns in patient with Chronic Diagnoses of Depression, Diabetes, HTN, HLD, GERD . Financial challenges . Social isolation occasionally  Clinical Social Work Clinical Goal(s):  [name] Over the next 30 days, client will work with LCSW to address depression issues of client and managing depression symptoms  Interventions:   Previously talked with client about family stress issues  Previously encouraged client to talk with RN CM regarding nursing needs of client  Previously talked with client about depression issues of client and managing depression symptoms  Previously talked with client about managing health issues faced by client  Talkedpreviously with client about social support network of client  Talkedpreviously withclient about pain issues faced by client  Patient Self Care Activities:  . Self administers medications as prescribed . Attends all scheduled provider appointments  . Cares for pet dog as means of relaxation  Plan  LCSW to call client in next 4 weeks to discuss client management of depression symptoms of client Client to attend scheduled medical appointments Client to communicate with RN CM related to nursing issues of client Client to use relaxation techniques to help her manage depression symptoms  Please see past updates related to this goal by clicking on the "Past Updates" button in the selected goal        Materials Provided: No  Follow Up Plan: LCSW to call client in next 4 weeks to discuss client management of depression symptoms of client  The patient Ashley Watts, son, verbalized understanding of instructions provided today and declined a print copy of patient instruction materials.   [name].Ludean Duhart MSW, LCSW Licensed Clinical Social  Worker [facility] [phone]

## 2020-02-12 NOTE — Chronic Care Management (AMB) (Signed)
  Care Management Note   Ashley Watts is a 59 y.o. year old female who is a primary care patient of [name], Connye Burkitt, [name]. The CM team was consulted for assistance with chronic disease management and care coordination.   I reached out to Ashley Watts, son of client,  by phone today.   Review of patient status, including review of consultants reports, relevant laboratory and other test results, and collaboration with appropriate care team members and the patient's provider was performed as part of comprehensive patient evaluation and provision of chronic care management services.   Social determinants of health: risk of social isolation; risk of depression; risk of stress; risk of physical inactivity    Office Visit from 05/10/2019 in [place]  PHQ-9 Total Score  5     Medications   (very important)  New medications from outside sources are available for reconciliation  aspirin EC 81 MG EC tablet blood glucose meter kit and supplies Blood Glucose Monitoring Suppl (ONETOUCH VERIO) w/Device KIT carvedilol (COREG) 12.5 MG tablet DULoxetine (CYMBALTA) 30 MG capsule fluticasone (FLONASE) 50 MCG/ACT nasal spray furosemide (LASIX) 40 MG tablet GLUCOPHAGE 1000 MG tablet glucose blood (ACCU-CHEK AVIVA) test strip Lancets (ACCU-CHEK SOFT TOUCH) lancets LANTUS SOLOSTAR 100 UNIT/ML Solostar Pen levothyroxine (SYNTHROID) 125 MCG tablet lisinopril (ZESTRIL) 40 MG tablet pantoprazole (PROTONIX) 40 MG tablet simvastatin (ZOCOR) 10 MG tablet traMADol (ULTRAM) 50 MG tablet  Goals        . Client stated: "I want help in managing my depression" (pt-stated)     Current Barriers:  [name] Mental Health Concerns in patient with Chronic Diagnoses of Depression, Diabetes, HTN, HLD, GERD . Financial challenges . Social isolation occasionally  Clinical Social Work Clinical Goal(s):  [name] Over the next 30 days, client will work with LCSW to address depression issues  of client and managing depression symptoms  Interventions:  Previously talked with client about family stress issues  Previously encouraged client to talk with RN CM regarding nursing needs of client  Previously talked with client about depression issues of client and managing depression symptoms  Previously talked with client about managing health issues faced by client  Talked previously with client about social support network of client  Talked previously with client about pain issues faced by client  Patient Self Care Activities:  . Self administers medications as prescribed . Attends all scheduled provider appointments  . Cares for pet dog as means of relaxation  Plan  LCSW to call client in next 4 weeks to discuss client management of depression symptoms of client Client to attend scheduled medical appointments Client to communicate with RN CM related to nursing issues of client Client to use relaxation techniques to help her manage depression symptoms  Please see past updates related to this goal by clicking on the "Past Updates" button in the selected goal     Follow Up Plan: LCSW to call client in next 4 weeks to talk with client about management of depression symptoms faced by client  [name].Keaghan Staton MSW, LCSW Licensed Clinical Social Worker [facility] [phone]

## 2020-02-21 ENCOUNTER — Other Ambulatory Visit: Payer: Self-pay

## 2020-02-21 ENCOUNTER — Emergency Department (HOSPITAL_COMMUNITY)
Admission: EM | Admit: 2020-02-21 | Discharge: 2020-02-21 | Discharge status: Against Medical Advice | Payer: Medicare Other

## 2020-02-21 ENCOUNTER — Ambulatory Visit (INDEPENDENT_AMBULATORY_CARE_PROVIDER_SITE_OTHER): Payer: Medicare Other | Admitting: Licensed Clinical Social Worker

## 2020-02-21 DIAGNOSIS — E785 Hyperlipidemia, unspecified: Secondary | ICD-10-CM

## 2020-02-21 DIAGNOSIS — F339 Major depressive disorder, recurrent, unspecified: Secondary | ICD-10-CM

## 2020-02-21 DIAGNOSIS — E1159 Type 2 diabetes mellitus with other circulatory complications: Secondary | ICD-10-CM

## 2020-02-21 DIAGNOSIS — I10 Essential (primary) hypertension: Secondary | ICD-10-CM | POA: Diagnosis not present

## 2020-02-21 DIAGNOSIS — K219 Gastro-esophageal reflux disease without esophagitis: Secondary | ICD-10-CM

## 2020-02-21 DIAGNOSIS — E1169 Type 2 diabetes mellitus with other specified complication: Secondary | ICD-10-CM

## 2020-02-21 DIAGNOSIS — Z794 Long term (current) use of insulin: Secondary | ICD-10-CM

## 2020-02-21 NOTE — Patient Instructions (Addendum)
Licensed Clinical Social Worker Visit Information  Goals we discussed today:  Goals    . Client stated: "I want help in managing my depression" (pt-stated)     Current Barriers:  [name] Mental Health Concerns in patient with Chronic Diagnoses of Depression, Diabetes, HTN, HLD, GERD . Financial challenges . Social isolation occasionally  Clinical Social Work Clinical Goal(s):  [name] Over the next 30 days, client will work with LCSW to address depression issues of client and managing depression symptoms  Interventions: Talked with client about family stress issues  Previously encouraged client to talk with RN CM regarding nursing needs of client  Previously talked with client about depression issues of client and managing depression symptoms  Previously talked with client about managing health issues faced by client  Talked with client about social support network of client Talked with client about pain issues faced by client  Patient Self Care Activities:  . Self administers medications as prescribed . Attends all scheduled provider appointments  . Cares for pet dog as means of relaxation  Plan  LCSW to call client in next 4 weeks to discuss client management of depression symptoms of client Client to attend scheduled medical appointments Client to communicate with RN CM related to nursing issues of client Client to use relaxation techniques to help her manage depression symptoms  Please see past updates related to this goal by clicking on the "Past Updates" button in the selected goal        Materials Provided: No  Follow Up Plan:  LCSW to call client in next 4 weeks to discuss client management of depression symptoms of client  The patient verbalized understanding of instructions provided today and declined a print copy of patient instruction materials.   [name].Chrisie Jankovich MSW, LCSW Licensed Clinical Social Worker [facility]  [facility] [phone]

## 2020-02-21 NOTE — Chronic Care Management (AMB) (Signed)
  Care Management Note   Ashley Watts is a 58 y.o. year old female who is a primary care patient of [name], Connye Burkitt, [name]. The CM team was consulted for assistance with chronic disease management and care coordination.   I reached out to Thressa Sheller by phone today.   Review of patient status, including review of consultants reports, relevant laboratory and other test results, and collaboration with appropriate care team members and the patient's provider was performed as part of comprehensive patient evaluation and provision of chronic care management services.   Social Determinants of health: risk of social isolation; risk of depression; risk of stress; risk of physical inactivity    Office Visit from 05/10/2019 in [place]  PHQ-9 Total Score  5     Medications   (very important)  New medications from outside sources are available for reconciliation  aspirin EC 81 MG EC tablet blood glucose meter kit and supplies Blood Glucose Monitoring Suppl (ONETOUCH VERIO) w/Device KIT carvedilol (COREG) 12.5 MG tablet DULoxetine (CYMBALTA) 30 MG capsule fluticasone (FLONASE) 50 MCG/ACT nasal spray furosemide (LASIX) 40 MG tablet GLUCOPHAGE 1000 MG tablet glucose blood (ACCU-CHEK AVIVA) test strip Lancets (ACCU-CHEK SOFT TOUCH) lancets LANTUS SOLOSTAR 100 UNIT/ML Solostar Pen levothyroxine (SYNTHROID) 125 MCG tablet lisinopril (ZESTRIL) 40 MG tablet pantoprazole (PROTONIX) 40 MG tablet simvastatin (ZOCOR) 10 MG tablet traMADol (ULTRAM) 50 MG tablet  Goals    . Client stated: "I want help in managing my depression" (pt-stated)     Current Barriers:  [name] Mental Health Concerns in patient with Chronic Diagnoses of Depression, Diabetes, HTN, HLD, GERD . Financial challenges . Social isolation occasionally  Clinical Social Work Clinical Goal(s):  [name] Over the next 30 days, client will work with LCSW to address depression issues of client and managing depression  symptoms  Interventions:  Talked with client about family stress issues  Previously encouraged client to talk with RN CM regarding nursing needs of client  Previously talked with client about depression issues of client and managing depression symptoms  Previously talked with client about managing health issues faced by client  [employer] client about social support network of client  Talkedwithclient about pain issues faced by client  Patient Self Care Activities:  . Self administers medications as prescribed . Attends all scheduled provider appointments  . Cares for pet dog as means of relaxation  Plan  LCSW to call client in next 4 weeks to discuss client management of depression symptoms of client Client to attend scheduled medical appointments Client to communicate with RN CM related to nursing issues of client Client to use relaxation techniques to help her manage depression symptoms  Please see past updates related to this goal by clicking on the "Past Updates" button in the selected goal       Follow Up Plan: LCSW to call client in next 4 weeks to discuss client management of depression symptoms  [name].Ruffus Kamaka MSW, LCSW Licensed Clinical Social Worker [facility] [phone]

## 2020-02-27 DIAGNOSIS — E039 Hypothyroidism, unspecified: Secondary | ICD-10-CM | POA: Diagnosis not present

## 2020-02-27 DIAGNOSIS — Z20822 Contact with and (suspected) exposure to covid-19: Secondary | ICD-10-CM | POA: Diagnosis not present

## 2020-02-27 DIAGNOSIS — Z79899 Other long term (current) drug therapy: Secondary | ICD-10-CM | POA: Diagnosis not present

## 2020-02-27 DIAGNOSIS — M545 Low back pain: Secondary | ICD-10-CM | POA: Diagnosis not present

## 2020-02-27 DIAGNOSIS — E1165 Type 2 diabetes mellitus with hyperglycemia: Secondary | ICD-10-CM | POA: Diagnosis not present

## 2020-03-23 ENCOUNTER — Ambulatory Visit: Payer: Self-pay | Admitting: Licensed Clinical Social Worker

## 2020-03-23 DIAGNOSIS — K219 Gastro-esophageal reflux disease without esophagitis: Secondary | ICD-10-CM

## 2020-03-23 DIAGNOSIS — Z794 Long term (current) use of insulin: Secondary | ICD-10-CM

## 2020-03-23 DIAGNOSIS — E1159 Type 2 diabetes mellitus with other circulatory complications: Secondary | ICD-10-CM

## 2020-03-23 DIAGNOSIS — F339 Major depressive disorder, recurrent, unspecified: Secondary | ICD-10-CM

## 2020-03-23 DIAGNOSIS — E785 Hyperlipidemia, unspecified: Secondary | ICD-10-CM

## 2020-03-23 DIAGNOSIS — E1169 Type 2 diabetes mellitus with other specified complication: Secondary | ICD-10-CM

## 2020-03-23 NOTE — Patient Instructions (Addendum)
Licensed Clinical Social Worker Visit Information  Materials Provided: No  Ashley Watts is a 59 y.o. year old female who is a primary care patient of Dettinger, Vonna Kotyk. The CCM team was consulted for assistance with [employer] .   Review of patient status, including review of consultants reports, other relevant assessments, and collaboration with appropriate care team members and the patient's provider was performed as part of comprehensive patient evaluation and provision of chronic care management services.   SDOH (Social Determinants of Health) assessments performed: No        Outpatient Encounter Medications as of 03/23/2020  Medication Sig Note  . aspirin EC 81 MG EC tablet Take 1 tablet (81 mg total) by mouth daily.   . blood glucose meter kit and supplies Dispense based on patient and insurance preference. Use up to four times daily as directed. (FOR ICD-10 E10.9, E11.9).   [name] Blood Glucose Monitoring Suppl (ONETOUCH VERIO) w/Device KIT 1 kit by Does not apply route 4 (four) times daily.   . carvedilol (COREG) 12.5 MG tablet Take 1 tablet (12.5 mg total) by mouth 2 (two) times daily with a meal.   . DULoxetine (CYMBALTA) 30 MG capsule Take 1 capsule (30 mg total) by mouth daily. (Needs to be seen before next refill)   . fluticasone (FLONASE) 50 MCG/ACT nasal spray Place 2 sprays into both nostrils daily.   . furosemide (LASIX) 40 MG tablet Take 1 tablet (40 mg total) by mouth daily.   [name] GLUCOPHAGE 1000 MG tablet Take 1 tablet (1,000 mg total) by mouth 2 (two) times daily with a meal.   . glucose blood (ACCU-CHEK AVIVA) test strip Check glucose up to QID Dx E11.9   . Lancets (ACCU-CHEK SOFT TOUCH) lancets Use as instructed   . LANTUS SOLOSTAR 100 UNIT/ML Solostar Pen INJECT 50 UNITS AT BEDTIME   . levothyroxine (SYNTHROID) 125 MCG tablet Take 1 tablet (125 mcg total) by mouth daily.   [name] lisinopril (ZESTRIL) 40 MG tablet Take 1 tablet (40 mg total) by mouth daily.   .  pantoprazole (PROTONIX) 40 MG tablet Take 1 tablet (40 mg total) by mouth daily.   . simvastatin (ZOCOR) 10 MG tablet Take 1 tablet (10 mg total) by mouth daily at 6 PM.   . traMADol (ULTRAM) 50 MG tablet Take 50 mg by mouth every 6 (six) hours as needed. 03/20/2019: Patient reported this was prescribed for chronic neck/back pain but that it does not help    No facility-administered encounter medications on file as of 03/23/2020.   LCSW called client home phone number several times on 03/23/2020 but LCSW was not able to speak via phone on 03/23/2020 with client. LCSW did leave phone message with client today asking her to please return call to LCSW at [phone] to discuss current needs of client   Follow Up Plan: LCSW to call client in next 4 weeks to talk with client about social work needs of client at that time  LCSW was not able to speak via phone with client today. Thus, patient was not able to verbalize understanding of patient instructions today and patient was not able to accept or decline a printed copy of patient instructions.   [name].Rodriguez Aguinaldo MSW, LCSW Licensed Clinical Social Worker [facility] [phone]

## 2020-03-23 NOTE — Chronic Care Management (AMB) (Signed)
  Chronic Care Management    Clinical Social Work Follow Up Note  03/23/2020 Name: Ashley Watts MRN: [id number] DOB: October 27, 1961  Ashley Watts is a 59 y.o. year old female who is a primary care patient of [place], Vonna Kotyk. The CCM team was consulted for assistance with [employer] .   Review of patient status, including review of consultants reports, other relevant assessments, and collaboration with appropriate care team members and the patient's provider was performed as part of comprehensive patient evaluation and provision of chronic care management services.    SDOH (Social Determinants of Health) assessments performed: No   Outpatient Encounter Medications as of 03/23/2020  Medication Sig Note  . aspirin EC 81 MG EC tablet Take 1 tablet (81 mg total) by mouth daily.   . blood glucose meter kit and supplies Dispense based on patient and insurance preference. Use up to four times daily as directed. (FOR ICD-10 E10.9, E11.9).   [name] Blood Glucose Monitoring Suppl (ONETOUCH VERIO) w/Device KIT 1 kit by Does not apply route 4 (four) times daily.   . carvedilol (COREG) 12.5 MG tablet Take 1 tablet (12.5 mg total) by mouth 2 (two) times daily with a meal.   . DULoxetine (CYMBALTA) 30 MG capsule Take 1 capsule (30 mg total) by mouth daily. (Needs to be seen before next refill)   . fluticasone (FLONASE) 50 MCG/ACT nasal spray Place 2 sprays into both nostrils daily.   . furosemide (LASIX) 40 MG tablet Take 1 tablet (40 mg total) by mouth daily.   [name] GLUCOPHAGE 1000 MG tablet Take 1 tablet (1,000 mg total) by mouth 2 (two) times daily with a meal.   . glucose blood (ACCU-CHEK AVIVA) test strip Check glucose up to QID Dx E11.9   . Lancets (ACCU-CHEK SOFT TOUCH) lancets Use as instructed   . LANTUS SOLOSTAR 100 UNIT/ML Solostar Pen INJECT 50 UNITS AT BEDTIME   . levothyroxine (SYNTHROID) 125 MCG tablet Take 1 tablet (125 mcg total) by mouth daily.   [name] lisinopril (ZESTRIL) 40 MG tablet  Take 1 tablet (40 mg total) by mouth daily.   . pantoprazole (PROTONIX) 40 MG tablet Take 1 tablet (40 mg total) by mouth daily.   . simvastatin (ZOCOR) 10 MG tablet Take 1 tablet (10 mg total) by mouth daily at 6 PM.   . traMADol (ULTRAM) 50 MG tablet Take 50 mg by mouth every 6 (six) hours as needed. 03/20/2019: Patient reported this was prescribed for chronic neck/back pain but that it does not help    No facility-administered encounter medications on file as of 03/23/2020.     LCSW called client home phone number several times on 03/23/2020 but LCSW was not able to speak via phone on 03/23/2020 with client. LCSW did leave phone message with client today asking her to please return call to LCSW at [phone] to discuss current needs of client  Follow Up Plan: LCSW to call client in next 4 weeks to talk with client about social work needs of client at that time  [name].Bryona Foxworthy MSW, LCSW Licensed Clinical Social Worker [facility] [phone]

## 2020-03-25 DIAGNOSIS — R0602 Shortness of breath: Secondary | ICD-10-CM | POA: Diagnosis not present

## 2020-03-25 DIAGNOSIS — E1165 Type 2 diabetes mellitus with hyperglycemia: Secondary | ICD-10-CM | POA: Diagnosis not present

## 2020-03-25 DIAGNOSIS — E039 Hypothyroidism, unspecified: Secondary | ICD-10-CM | POA: Diagnosis not present

## 2020-03-25 DIAGNOSIS — Z79899 Other long term (current) drug therapy: Secondary | ICD-10-CM | POA: Diagnosis not present

## 2020-03-25 DIAGNOSIS — M545 Low back pain: Secondary | ICD-10-CM | POA: Diagnosis not present

## 2020-03-25 DIAGNOSIS — M542 Cervicalgia: Secondary | ICD-10-CM | POA: Diagnosis not present

## 2020-04-15 DIAGNOSIS — E039 Hypothyroidism, unspecified: Secondary | ICD-10-CM | POA: Diagnosis not present

## 2020-04-15 DIAGNOSIS — Z79899 Other long term (current) drug therapy: Secondary | ICD-10-CM | POA: Diagnosis not present

## 2020-04-15 DIAGNOSIS — M545 Low back pain: Secondary | ICD-10-CM | POA: Diagnosis not present

## 2020-04-15 DIAGNOSIS — M542 Cervicalgia: Secondary | ICD-10-CM | POA: Diagnosis not present

## 2020-04-15 DIAGNOSIS — Z0000 Encounter for general adult medical examination without abnormal findings: Secondary | ICD-10-CM | POA: Diagnosis not present

## 2020-04-15 DIAGNOSIS — E1165 Type 2 diabetes mellitus with hyperglycemia: Secondary | ICD-10-CM | POA: Diagnosis not present

## 2020-04-22 ENCOUNTER — Ambulatory Visit: Payer: Self-pay | Admitting: Licensed Clinical Social Worker

## 2020-04-22 DIAGNOSIS — K219 Gastro-esophageal reflux disease without esophagitis: Secondary | ICD-10-CM

## 2020-04-22 DIAGNOSIS — E1159 Type 2 diabetes mellitus with other circulatory complications: Secondary | ICD-10-CM

## 2020-04-22 DIAGNOSIS — Z794 Long term (current) use of insulin: Secondary | ICD-10-CM

## 2020-04-22 DIAGNOSIS — E785 Hyperlipidemia, unspecified: Secondary | ICD-10-CM

## 2020-04-22 DIAGNOSIS — F339 Major depressive disorder, recurrent, unspecified: Secondary | ICD-10-CM

## 2020-04-22 NOTE — Patient Instructions (Addendum)
Licensed Clinical Social Worker Visit Information  Materials Provided: No  04/22/2020   Name: Ashley Watts MRN: [id number] DOB: 06/10/1961   Ashley Watts is a 59 y.o. year old female who is a primary care patient of [facility], Baxter Kail, [place]. The CCM team was consulted for assistance with [employer] .   Review of patient status, including review of consultants reports, other relevant assessments, and collaboration with appropriate care team members and the patient's provider was performed as part of comprehensive patient evaluation and provision of chronic care management services.   SDOH (Social Determinants of Health) assessments performed: No;risk of social isolation; risk of depression; risk of stress; risk of physical inactivity    Upon chart review, client has listed PCP as Baxter Kail Mayo PA-C.  LCSW called client today and was not able to speak with client via phone about PCP coverage for client. However, LCSW did leave phone message for client today requesting she return call to LCSW at [phone] to discuss PCP coverage for client   Follow Up Plan: LCSW to call client in next 4 weeks to talk with client about PCP coverage for client  LCSW was not able to speak via phone with patient today; thus, the patient was not able to verbalize understanding of instructions provided today and was not able to accept or decline a print copy of patient instruction materials.   [name].Forrest MSW, LCSW Licensed Clinical Social Worker [facility] [phone]

## 2020-04-22 NOTE — Chronic Care Management (AMB) (Signed)
  Chronic Care Management    Clinical Social Work Follow Up Note  04/22/2020 Name: DESERA GRAFFEO MRN: [id number] DOB: [birth year]  Thressa Sheller is a 59 y.o. year old female who is a primary care patient of [place], Darla Lesches, [place]. The CCM team was consulted for assistance with [employer] .   Review of patient status, including review of consultants reports, other relevant assessments, and collaboration with appropriate care team members and the patient's provider was performed as part of comprehensive patient evaluation and provision of chronic care management services.    SDOH (Social Determinants of Health) assessments performed: No;risk of social isolation; risk of depression; risk of stress; risk of physical inactivity    Office Visit from 05/10/2019 in [facility]  PHQ-9 Total Score  5      Outpatient Encounter Medications as of 04/22/2020  Medication Sig Note  . aspirin EC 81 MG EC tablet Take 1 tablet (81 mg total) by mouth daily.   . blood glucose meter kit and supplies Dispense based on patient and insurance preference. Use up to four times daily as directed. (FOR ICD-10 E10.9, E11.9).   [name] Blood Glucose Monitoring Suppl (ONETOUCH VERIO) w/Device KIT 1 kit by Does not apply route 4 (four) times daily.   . carvedilol (COREG) 12.5 MG tablet Take 1 tablet (12.5 mg total) by mouth 2 (two) times daily with a meal.   . DULoxetine (CYMBALTA) 30 MG capsule Take 1 capsule (30 mg total) by mouth daily. (Needs to be seen before next refill)   . fluticasone (FLONASE) 50 MCG/ACT nasal spray Place 2 sprays into both nostrils daily.   . furosemide (LASIX) 40 MG tablet Take 1 tablet (40 mg total) by mouth daily.   [name] GLUCOPHAGE 1000 MG tablet Take 1 tablet (1,000 mg total) by mouth 2 (two) times daily with a meal.   . glucose blood (ACCU-CHEK AVIVA) test strip Check glucose up to QID Dx E11.9   . Lancets (ACCU-CHEK SOFT TOUCH) lancets Use as instructed   .  LANTUS SOLOSTAR 100 UNIT/ML Solostar Pen INJECT 50 UNITS AT BEDTIME   . levothyroxine (SYNTHROID) 125 MCG tablet Take 1 tablet (125 mcg total) by mouth daily.   [name] lisinopril (ZESTRIL) 40 MG tablet Take 1 tablet (40 mg total) by mouth daily.   . pantoprazole (PROTONIX) 40 MG tablet Take 1 tablet (40 mg total) by mouth daily.   . simvastatin (ZOCOR) 10 MG tablet Take 1 tablet (10 mg total) by mouth daily at 6 PM.   . traMADol (ULTRAM) 50 MG tablet Take 50 mg by mouth every 6 (six) hours as needed. 03/20/2019: Patient reported this was prescribed for chronic neck/back pain but that it does not help    No facility-administered encounter medications on file as of 04/22/2020.    Upon chart review, client has listed PCP as Darla Lesches Mayo PA-C.  LCSW called client today and was not able to speak with client via phone about PCP coverage for client. However, LCSW did leave phone message for client today requesting she return call to LCSW at [phone] to discuss PCP coverage for client   Follow Up Plan: LCSW to call client in next 4 weeks to talk with client about PCP coverage for client  [name].Romari Gasparro MSW, LCSW Licensed Clinical Social Worker [facility] [phone]

## 2020-05-06 DIAGNOSIS — E559 Vitamin D deficiency, unspecified: Secondary | ICD-10-CM | POA: Diagnosis not present

## 2020-05-06 DIAGNOSIS — E039 Hypothyroidism, unspecified: Secondary | ICD-10-CM | POA: Diagnosis not present

## 2020-05-06 DIAGNOSIS — E7800 Pure hypercholesterolemia, unspecified: Secondary | ICD-10-CM | POA: Diagnosis not present

## 2020-05-06 DIAGNOSIS — Z1159 Encounter for screening for other viral diseases: Secondary | ICD-10-CM | POA: Diagnosis not present

## 2020-05-06 DIAGNOSIS — E1165 Type 2 diabetes mellitus with hyperglycemia: Secondary | ICD-10-CM | POA: Diagnosis not present

## 2020-05-06 DIAGNOSIS — M545 Low back pain: Secondary | ICD-10-CM | POA: Diagnosis not present

## 2020-05-06 DIAGNOSIS — Z79899 Other long term (current) drug therapy: Secondary | ICD-10-CM | POA: Diagnosis not present

## 2020-05-27 DIAGNOSIS — M545 Low back pain: Secondary | ICD-10-CM | POA: Diagnosis not present

## 2020-05-27 DIAGNOSIS — E1165 Type 2 diabetes mellitus with hyperglycemia: Secondary | ICD-10-CM | POA: Diagnosis not present

## 2020-05-27 DIAGNOSIS — E039 Hypothyroidism, unspecified: Secondary | ICD-10-CM | POA: Diagnosis not present

## 2020-05-27 DIAGNOSIS — Z79899 Other long term (current) drug therapy: Secondary | ICD-10-CM | POA: Diagnosis not present

## 2020-05-28 ENCOUNTER — Ambulatory Visit: Payer: Self-pay

## 2020-05-28 DIAGNOSIS — E1159 Type 2 diabetes mellitus with other circulatory complications: Secondary | ICD-10-CM

## 2020-05-28 DIAGNOSIS — F339 Major depressive disorder, recurrent, unspecified: Secondary | ICD-10-CM

## 2020-05-28 DIAGNOSIS — K219 Gastro-esophageal reflux disease without esophagitis: Secondary | ICD-10-CM

## 2020-05-28 DIAGNOSIS — Z794 Long term (current) use of insulin: Secondary | ICD-10-CM

## 2020-05-28 DIAGNOSIS — E785 Hyperlipidemia, unspecified: Secondary | ICD-10-CM

## 2020-05-28 NOTE — Patient Instructions (Addendum)
Licensed Clinical Social Worker Visit Information  Materials Provided: No   05/28/2020  Name: Ashley Watts      MRN: [id number]       DOB: [birth year]  [name] is a 59 y.o. year old female who is a primary care patient of [facility], Baxter Kail, [place]. The CCM team was consulted for assistance with [employer] .   Review of patient status, including review of consultants reports, other relevant assessments, and collaboration with appropriate care team members and the patient's provider was performed as part of comprehensive patient evaluation and provision of chronic care management services.    SDOH (Social Determinants of Health) assessments performed: No;risk for depression;risk for stress; risk for financial strain   Upon chart review, client has listed PCP as Baxter Kail Mayo PA-C. LCSW was not able to speak via phone with client today. However, Kariana had previously informed LCSW that she had started going to a new medical practice. [facility] is her new medical provider.  LCSW is discharging client from CCM program services today since client is no longer receiving medical care at [facility].  LCSW was not able to speak via phone with client today; thus, the client was not able to  verbalize understanding of instructions provided today and was not able to accept or decline a print copy of patient instruction materials.   [name].Brendaliz Kuk MSW, LCSW Licensed Clinical Social Worker [facility] [phone]

## 2020-05-28 NOTE — Chronic Care Management (AMB) (Signed)
  Chronic Care Management    Clinical Social Work Follow Up Note  05/28/2020 Name: Ashley Watts MRN: [id number] DOB: 06-11-1961  Ashley Watts is a 59 y.o. year old female who is a primary care patient of [place], Darla Lesches, [place]. The CCM team was consulted for assistance with [employer] .   Review of patient status, including review of consultants reports, other relevant assessments, and collaboration with appropriate care team members and the patient's provider was performed as part of comprehensive patient evaluation and provision of chronic care management services.    SDOH (Social Determinants of Health) assessments performed: No;risk for depression;risk for stress; risk for financial strain    Office Visit from 05/10/2019 in [facility]  PHQ-9 Total Score 5       Outpatient Encounter Medications as of 05/28/2020  Medication Sig Note  . aspirin EC 81 MG EC tablet Take 1 tablet (81 mg total) by mouth daily.   . blood glucose meter kit and supplies Dispense based on patient and insurance preference. Use up to four times daily as directed. (FOR ICD-10 E10.9, E11.9).   [name] Blood Glucose Monitoring Suppl (ONETOUCH VERIO) w/Device KIT 1 kit by Does not apply route 4 (four) times daily.   . carvedilol (COREG) 12.5 MG tablet Take 1 tablet (12.5 mg total) by mouth 2 (two) times daily with a meal.   . DULoxetine (CYMBALTA) 30 MG capsule Take 1 capsule (30 mg total) by mouth daily. (Needs to be seen before next refill)   . fluticasone (FLONASE) 50 MCG/ACT nasal spray Place 2 sprays into both nostrils daily.   . furosemide (LASIX) 40 MG tablet Take 1 tablet (40 mg total) by mouth daily.   [name] GLUCOPHAGE 1000 MG tablet Take 1 tablet (1,000 mg total) by mouth 2 (two) times daily with a meal.   . glucose blood (ACCU-CHEK AVIVA) test strip Check glucose up to QID Dx E11.9   . Lancets (ACCU-CHEK SOFT TOUCH) lancets Use as instructed   . LANTUS SOLOSTAR 100  UNIT/ML Solostar Pen INJECT 50 UNITS AT BEDTIME   . levothyroxine (SYNTHROID) 125 MCG tablet Take 1 tablet (125 mcg total) by mouth daily.   [name] lisinopril (ZESTRIL) 40 MG tablet Take 1 tablet (40 mg total) by mouth daily.   . pantoprazole (PROTONIX) 40 MG tablet Take 1 tablet (40 mg total) by mouth daily.   . simvastatin (ZOCOR) 10 MG tablet Take 1 tablet (10 mg total) by mouth daily at 6 PM.   . traMADol (ULTRAM) 50 MG tablet Take 50 mg by mouth every 6 (six) hours as needed. 03/20/2019: Patient reported this was prescribed for chronic neck/back pain but that it does not help    No facility-administered encounter medications on file as of 05/28/2020.      Upon chart review, client has listed PCP as Darla Lesches Mayo PA-C. LCSW was not able to speak via phone with client today. However, [name] had previously informed LCSW that she had started going to a new medical practice. [facility] PA-C is her new medical provider.  LCSW is discharging client from CCM program services today since client is no longer receiving medical care at [facility].  [name].[name] MSW, LCSW Licensed Clinical Social Worker [facility] [phone]

## 2020-06-19 DIAGNOSIS — E039 Hypothyroidism, unspecified: Secondary | ICD-10-CM | POA: Diagnosis not present

## 2020-06-19 DIAGNOSIS — M25511 Pain in right shoulder: Secondary | ICD-10-CM | POA: Diagnosis not present

## 2020-06-19 DIAGNOSIS — Z79899 Other long term (current) drug therapy: Secondary | ICD-10-CM | POA: Diagnosis not present

## 2020-06-19 DIAGNOSIS — M545 Low back pain: Secondary | ICD-10-CM | POA: Diagnosis not present

## 2020-06-19 DIAGNOSIS — M19011 Primary osteoarthritis, right shoulder: Secondary | ICD-10-CM | POA: Diagnosis not present

## 2020-06-19 DIAGNOSIS — E1165 Type 2 diabetes mellitus with hyperglycemia: Secondary | ICD-10-CM | POA: Diagnosis not present

## 2020-07-01 DIAGNOSIS — E119 Type 2 diabetes mellitus without complications: Secondary | ICD-10-CM | POA: Diagnosis not present

## 2020-07-01 DIAGNOSIS — Z1211 Encounter for screening for malignant neoplasm of colon: Secondary | ICD-10-CM | POA: Diagnosis not present

## 2020-07-09 DIAGNOSIS — M542 Cervicalgia: Secondary | ICD-10-CM | POA: Diagnosis not present

## 2020-07-09 DIAGNOSIS — M545 Low back pain: Secondary | ICD-10-CM | POA: Diagnosis not present

## 2020-07-09 DIAGNOSIS — Z79899 Other long term (current) drug therapy: Secondary | ICD-10-CM | POA: Diagnosis not present

## 2020-07-09 DIAGNOSIS — E039 Hypothyroidism, unspecified: Secondary | ICD-10-CM | POA: Diagnosis not present

## 2020-07-09 DIAGNOSIS — E1165 Type 2 diabetes mellitus with hyperglycemia: Secondary | ICD-10-CM | POA: Diagnosis not present

## 2020-07-30 DIAGNOSIS — Z01818 Encounter for other preprocedural examination: Secondary | ICD-10-CM | POA: Diagnosis not present

## 2020-08-12 DIAGNOSIS — E039 Hypothyroidism, unspecified: Secondary | ICD-10-CM | POA: Diagnosis not present

## 2020-08-12 DIAGNOSIS — E1165 Type 2 diabetes mellitus with hyperglycemia: Secondary | ICD-10-CM | POA: Diagnosis not present

## 2020-08-12 DIAGNOSIS — E7800 Pure hypercholesterolemia, unspecified: Secondary | ICD-10-CM | POA: Diagnosis not present

## 2020-08-12 DIAGNOSIS — E559 Vitamin D deficiency, unspecified: Secondary | ICD-10-CM | POA: Diagnosis not present

## 2020-08-12 DIAGNOSIS — Z20822 Contact with and (suspected) exposure to covid-19: Secondary | ICD-10-CM | POA: Diagnosis not present

## 2020-08-12 DIAGNOSIS — M545 Low back pain: Secondary | ICD-10-CM | POA: Diagnosis not present

## 2020-08-12 DIAGNOSIS — Z79899 Other long term (current) drug therapy: Secondary | ICD-10-CM | POA: Diagnosis not present

## 2020-08-12 DIAGNOSIS — M542 Cervicalgia: Secondary | ICD-10-CM | POA: Diagnosis not present

## 2020-08-12 DIAGNOSIS — R079 Chest pain, unspecified: Secondary | ICD-10-CM | POA: Diagnosis not present

## 2020-08-25 DIAGNOSIS — I209 Angina pectoris, unspecified: Secondary | ICD-10-CM | POA: Diagnosis not present

## 2020-08-25 DIAGNOSIS — M79604 Pain in right leg: Secondary | ICD-10-CM | POA: Diagnosis not present

## 2020-08-25 DIAGNOSIS — M79605 Pain in left leg: Secondary | ICD-10-CM | POA: Diagnosis not present

## 2020-08-27 DIAGNOSIS — E1165 Type 2 diabetes mellitus with hyperglycemia: Secondary | ICD-10-CM | POA: Diagnosis not present

## 2020-08-27 DIAGNOSIS — M545 Low back pain: Secondary | ICD-10-CM | POA: Diagnosis not present

## 2020-08-27 DIAGNOSIS — H6123 Impacted cerumen, bilateral: Secondary | ICD-10-CM | POA: Diagnosis not present

## 2020-08-27 DIAGNOSIS — M542 Cervicalgia: Secondary | ICD-10-CM | POA: Diagnosis not present

## 2020-08-27 DIAGNOSIS — E039 Hypothyroidism, unspecified: Secondary | ICD-10-CM | POA: Diagnosis not present

## 2020-08-27 DIAGNOSIS — Z79899 Other long term (current) drug therapy: Secondary | ICD-10-CM | POA: Diagnosis not present

## 2020-09-03 DIAGNOSIS — R0989 Other specified symptoms and signs involving the circulatory and respiratory systems: Secondary | ICD-10-CM | POA: Diagnosis not present

## 2020-09-03 DIAGNOSIS — I209 Angina pectoris, unspecified: Secondary | ICD-10-CM | POA: Diagnosis not present

## 2020-09-03 DIAGNOSIS — R269 Unspecified abnormalities of gait and mobility: Secondary | ICD-10-CM | POA: Diagnosis not present

## 2020-09-03 DIAGNOSIS — M79605 Pain in left leg: Secondary | ICD-10-CM | POA: Diagnosis not present

## 2020-09-03 DIAGNOSIS — M79604 Pain in right leg: Secondary | ICD-10-CM | POA: Diagnosis not present

## 2020-09-08 DIAGNOSIS — I209 Angina pectoris, unspecified: Secondary | ICD-10-CM | POA: Diagnosis not present

## 2020-09-08 DIAGNOSIS — R9431 Abnormal electrocardiogram [ECG] [EKG]: Secondary | ICD-10-CM | POA: Diagnosis not present

## 2020-09-08 DIAGNOSIS — R0602 Shortness of breath: Secondary | ICD-10-CM | POA: Diagnosis not present

## 2020-09-15 DIAGNOSIS — M542 Cervicalgia: Secondary | ICD-10-CM | POA: Diagnosis not present

## 2020-09-15 DIAGNOSIS — E039 Hypothyroidism, unspecified: Secondary | ICD-10-CM | POA: Diagnosis not present

## 2020-09-15 DIAGNOSIS — E1165 Type 2 diabetes mellitus with hyperglycemia: Secondary | ICD-10-CM | POA: Diagnosis not present

## 2020-09-15 DIAGNOSIS — Z79899 Other long term (current) drug therapy: Secondary | ICD-10-CM | POA: Diagnosis not present

## 2020-09-15 DIAGNOSIS — M545 Low back pain, unspecified: Secondary | ICD-10-CM | POA: Diagnosis not present

## 2020-10-09 DIAGNOSIS — M542 Cervicalgia: Secondary | ICD-10-CM | POA: Diagnosis not present

## 2020-10-09 DIAGNOSIS — E1165 Type 2 diabetes mellitus with hyperglycemia: Secondary | ICD-10-CM | POA: Diagnosis not present

## 2020-10-09 DIAGNOSIS — R059 Cough, unspecified: Secondary | ICD-10-CM | POA: Diagnosis not present

## 2020-10-09 DIAGNOSIS — Z79899 Other long term (current) drug therapy: Secondary | ICD-10-CM | POA: Diagnosis not present

## 2020-10-09 DIAGNOSIS — M545 Low back pain, unspecified: Secondary | ICD-10-CM | POA: Diagnosis not present

## 2020-10-27 DIAGNOSIS — E1165 Type 2 diabetes mellitus with hyperglycemia: Secondary | ICD-10-CM | POA: Diagnosis not present

## 2020-10-27 DIAGNOSIS — I10 Essential (primary) hypertension: Secondary | ICD-10-CM | POA: Diagnosis not present

## 2020-10-27 DIAGNOSIS — Z79899 Other long term (current) drug therapy: Secondary | ICD-10-CM | POA: Diagnosis not present

## 2020-10-27 DIAGNOSIS — M545 Low back pain, unspecified: Secondary | ICD-10-CM | POA: Diagnosis not present

## 2020-10-27 DIAGNOSIS — M542 Cervicalgia: Secondary | ICD-10-CM | POA: Diagnosis not present

## 2020-10-27 DIAGNOSIS — E039 Hypothyroidism, unspecified: Secondary | ICD-10-CM | POA: Diagnosis not present

## 2020-10-27 DIAGNOSIS — R300 Dysuria: Secondary | ICD-10-CM | POA: Diagnosis not present

## 2020-11-09 ENCOUNTER — Encounter: Payer: Self-pay | Admitting: Internal Medicine

## 2020-11-10 ENCOUNTER — Encounter: Payer: Self-pay | Admitting: Internal Medicine

## 2020-11-10 DIAGNOSIS — I10 Essential (primary) hypertension: Secondary | ICD-10-CM | POA: Diagnosis not present

## 2020-11-10 DIAGNOSIS — M542 Cervicalgia: Secondary | ICD-10-CM | POA: Diagnosis not present

## 2020-11-10 DIAGNOSIS — Z79899 Other long term (current) drug therapy: Secondary | ICD-10-CM | POA: Diagnosis not present

## 2020-11-10 DIAGNOSIS — M545 Low back pain, unspecified: Secondary | ICD-10-CM | POA: Diagnosis not present

## 2020-11-10 DIAGNOSIS — R300 Dysuria: Secondary | ICD-10-CM | POA: Diagnosis not present

## 2020-12-01 DIAGNOSIS — E7800 Pure hypercholesterolemia, unspecified: Secondary | ICD-10-CM | POA: Diagnosis not present

## 2020-12-01 DIAGNOSIS — I10 Essential (primary) hypertension: Secondary | ICD-10-CM | POA: Diagnosis not present

## 2020-12-01 DIAGNOSIS — E1165 Type 2 diabetes mellitus with hyperglycemia: Secondary | ICD-10-CM | POA: Diagnosis not present

## 2020-12-01 DIAGNOSIS — E0590 Thyrotoxicosis, unspecified without thyrotoxic crisis or storm: Secondary | ICD-10-CM | POA: Diagnosis not present

## 2020-12-01 DIAGNOSIS — E559 Vitamin D deficiency, unspecified: Secondary | ICD-10-CM | POA: Diagnosis not present

## 2020-12-01 DIAGNOSIS — M545 Low back pain, unspecified: Secondary | ICD-10-CM | POA: Diagnosis not present

## 2020-12-01 DIAGNOSIS — M542 Cervicalgia: Secondary | ICD-10-CM | POA: Diagnosis not present

## 2020-12-01 DIAGNOSIS — Z79899 Other long term (current) drug therapy: Secondary | ICD-10-CM | POA: Diagnosis not present

## 2020-12-24 DIAGNOSIS — Z79899 Other long term (current) drug therapy: Secondary | ICD-10-CM | POA: Diagnosis not present

## 2020-12-24 DIAGNOSIS — I10 Essential (primary) hypertension: Secondary | ICD-10-CM | POA: Diagnosis not present

## 2020-12-24 DIAGNOSIS — E1165 Type 2 diabetes mellitus with hyperglycemia: Secondary | ICD-10-CM | POA: Diagnosis not present

## 2020-12-24 DIAGNOSIS — M545 Low back pain, unspecified: Secondary | ICD-10-CM | POA: Diagnosis not present

## 2020-12-24 DIAGNOSIS — M542 Cervicalgia: Secondary | ICD-10-CM | POA: Diagnosis not present

## 2021-01-27 DIAGNOSIS — Z79899 Other long term (current) drug therapy: Secondary | ICD-10-CM | POA: Diagnosis not present

## 2021-01-27 DIAGNOSIS — I10 Essential (primary) hypertension: Secondary | ICD-10-CM | POA: Diagnosis not present

## 2021-01-27 DIAGNOSIS — M542 Cervicalgia: Secondary | ICD-10-CM | POA: Diagnosis not present

## 2021-01-27 DIAGNOSIS — M545 Low back pain, unspecified: Secondary | ICD-10-CM | POA: Diagnosis not present

## 2021-01-27 DIAGNOSIS — E1165 Type 2 diabetes mellitus with hyperglycemia: Secondary | ICD-10-CM | POA: Diagnosis not present

## 2021-01-27 DIAGNOSIS — M19011 Primary osteoarthritis, right shoulder: Secondary | ICD-10-CM | POA: Diagnosis not present

## 2021-02-17 DIAGNOSIS — Z79899 Other long term (current) drug therapy: Secondary | ICD-10-CM | POA: Diagnosis not present

## 2021-02-17 DIAGNOSIS — M542 Cervicalgia: Secondary | ICD-10-CM | POA: Diagnosis not present

## 2021-02-17 DIAGNOSIS — R059 Cough, unspecified: Secondary | ICD-10-CM | POA: Diagnosis not present

## 2021-02-17 DIAGNOSIS — M545 Low back pain, unspecified: Secondary | ICD-10-CM | POA: Diagnosis not present

## 2021-02-17 DIAGNOSIS — E1165 Type 2 diabetes mellitus with hyperglycemia: Secondary | ICD-10-CM | POA: Diagnosis not present

## 2021-02-17 DIAGNOSIS — Z20822 Contact with and (suspected) exposure to covid-19: Secondary | ICD-10-CM | POA: Diagnosis not present

## 2021-02-17 DIAGNOSIS — M19011 Primary osteoarthritis, right shoulder: Secondary | ICD-10-CM | POA: Diagnosis not present

## 2021-02-17 DIAGNOSIS — I10 Essential (primary) hypertension: Secondary | ICD-10-CM | POA: Diagnosis not present

## 2021-03-10 DIAGNOSIS — E039 Hypothyroidism, unspecified: Secondary | ICD-10-CM | POA: Diagnosis not present

## 2021-03-10 DIAGNOSIS — Z0000 Encounter for general adult medical examination without abnormal findings: Secondary | ICD-10-CM | POA: Diagnosis not present

## 2021-03-10 DIAGNOSIS — Z20822 Contact with and (suspected) exposure to covid-19: Secondary | ICD-10-CM | POA: Diagnosis not present

## 2021-03-10 DIAGNOSIS — E1165 Type 2 diabetes mellitus with hyperglycemia: Secondary | ICD-10-CM | POA: Diagnosis not present

## 2021-03-10 DIAGNOSIS — I10 Essential (primary) hypertension: Secondary | ICD-10-CM | POA: Diagnosis not present

## 2021-03-10 DIAGNOSIS — M545 Low back pain, unspecified: Secondary | ICD-10-CM | POA: Diagnosis not present

## 2021-03-10 DIAGNOSIS — Z79899 Other long term (current) drug therapy: Secondary | ICD-10-CM | POA: Diagnosis not present

## 2021-03-10 DIAGNOSIS — M542 Cervicalgia: Secondary | ICD-10-CM | POA: Diagnosis not present

## 2021-03-10 DIAGNOSIS — M19011 Primary osteoarthritis, right shoulder: Secondary | ICD-10-CM | POA: Diagnosis not present

## 2021-03-10 DIAGNOSIS — E559 Vitamin D deficiency, unspecified: Secondary | ICD-10-CM | POA: Diagnosis not present

## 2021-03-10 DIAGNOSIS — E7800 Pure hypercholesterolemia, unspecified: Secondary | ICD-10-CM | POA: Diagnosis not present

## 2021-04-01 DIAGNOSIS — E1165 Type 2 diabetes mellitus with hyperglycemia: Secondary | ICD-10-CM | POA: Diagnosis not present

## 2021-04-01 DIAGNOSIS — Z79899 Other long term (current) drug therapy: Secondary | ICD-10-CM | POA: Diagnosis not present

## 2021-04-01 DIAGNOSIS — I10 Essential (primary) hypertension: Secondary | ICD-10-CM | POA: Diagnosis not present

## 2021-04-01 DIAGNOSIS — M542 Cervicalgia: Secondary | ICD-10-CM | POA: Diagnosis not present

## 2021-04-01 DIAGNOSIS — M545 Low back pain, unspecified: Secondary | ICD-10-CM | POA: Diagnosis not present

## 2021-04-01 DIAGNOSIS — M19011 Primary osteoarthritis, right shoulder: Secondary | ICD-10-CM | POA: Diagnosis not present

## 2021-04-15 DIAGNOSIS — I10 Essential (primary) hypertension: Secondary | ICD-10-CM | POA: Diagnosis not present

## 2021-04-15 DIAGNOSIS — Z79899 Other long term (current) drug therapy: Secondary | ICD-10-CM | POA: Diagnosis not present

## 2021-04-15 DIAGNOSIS — E1165 Type 2 diabetes mellitus with hyperglycemia: Secondary | ICD-10-CM | POA: Diagnosis not present

## 2021-04-15 DIAGNOSIS — M545 Low back pain, unspecified: Secondary | ICD-10-CM | POA: Diagnosis not present

## 2021-04-15 DIAGNOSIS — M19011 Primary osteoarthritis, right shoulder: Secondary | ICD-10-CM | POA: Diagnosis not present

## 2021-04-15 DIAGNOSIS — M542 Cervicalgia: Secondary | ICD-10-CM | POA: Diagnosis not present

## 2021-05-02 ENCOUNTER — Emergency Department (HOSPITAL_COMMUNITY)
Admission: EM | Admit: 2021-05-02 | Discharge: 2021-05-03 | Disposition: A | Discharge status: Home / Self Care | Payer: Medicare Other | Attending: Emergency Medicine | Admitting: Emergency Medicine

## 2021-05-02 ENCOUNTER — Encounter (HOSPITAL_COMMUNITY): Payer: Self-pay | Admitting: *Deleted

## 2021-05-02 ENCOUNTER — Emergency Department (HOSPITAL_COMMUNITY): Payer: Medicare Other

## 2021-05-02 ENCOUNTER — Other Ambulatory Visit: Payer: Self-pay

## 2021-05-02 DIAGNOSIS — E039 Hypothyroidism, unspecified: Secondary | ICD-10-CM | POA: Insufficient documentation

## 2021-05-02 DIAGNOSIS — Z7982 Long term (current) use of aspirin: Secondary | ICD-10-CM | POA: Diagnosis not present

## 2021-05-02 DIAGNOSIS — R200 Anesthesia of skin: Secondary | ICD-10-CM | POA: Diagnosis not present

## 2021-05-02 DIAGNOSIS — I5032 Chronic diastolic (congestive) heart failure: Secondary | ICD-10-CM | POA: Diagnosis not present

## 2021-05-02 DIAGNOSIS — E1142 Type 2 diabetes mellitus with diabetic polyneuropathy: Secondary | ICD-10-CM | POA: Diagnosis not present

## 2021-05-02 DIAGNOSIS — Z794 Long term (current) use of insulin: Secondary | ICD-10-CM | POA: Insufficient documentation

## 2021-05-02 DIAGNOSIS — I110 Hypertensive heart disease with heart failure: Secondary | ICD-10-CM | POA: Insufficient documentation

## 2021-05-02 DIAGNOSIS — I6782 Cerebral ischemia: Secondary | ICD-10-CM | POA: Diagnosis not present

## 2021-05-02 DIAGNOSIS — R202 Paresthesia of skin: Secondary | ICD-10-CM | POA: Insufficient documentation

## 2021-05-02 DIAGNOSIS — I10 Essential (primary) hypertension: Secondary | ICD-10-CM | POA: Diagnosis not present

## 2021-05-02 DIAGNOSIS — Z7984 Long term (current) use of oral hypoglycemic drugs: Secondary | ICD-10-CM | POA: Diagnosis not present

## 2021-05-02 DIAGNOSIS — Z79899 Other long term (current) drug therapy: Secondary | ICD-10-CM | POA: Insufficient documentation

## 2021-05-02 DIAGNOSIS — E1140 Type 2 diabetes mellitus with diabetic neuropathy, unspecified: Secondary | ICD-10-CM | POA: Insufficient documentation

## 2021-05-02 DIAGNOSIS — I639 Cerebral infarction, unspecified: Secondary | ICD-10-CM | POA: Diagnosis not present

## 2021-05-02 LAB — BASIC METABOLIC PANEL
Anion gap: 8 (ref 5–15)
BUN: 12 mg/dL (ref 6–20)
CO2: 27 mmol/L (ref 22–32)
Calcium: 9.2 mg/dL (ref 8.9–10.3)
Chloride: 101 mmol/L (ref 98–111)
Creatinine, Ser: 0.86 mg/dL (ref 0.44–1.00)
GFR, Estimated: >60 mL/min (ref >60)
Glucose, Bld: 278 mg/dL — ABNORMAL HIGH (ref 70–99)
Potassium: 4.1 mmol/L (ref 3.5–5.1)
Sodium: 136 mmol/L (ref 135–145)

## 2021-05-02 LAB — CBC
HCT: 33.6 % — ABNORMAL LOW (ref 36.0–46.0)
Hemoglobin: 10.5 g/dL — ABNORMAL LOW (ref 12.0–15.0)
MCH: 27.9 pg (ref 26.0–34.0)
MCHC: 31.3 g/dL (ref 30.0–36.0)
MCV: 89.1 fL (ref 80.0–100.0)
Platelets: 332 10*3/uL (ref 150–400)
RBC: 3.77 MIL/uL — ABNORMAL LOW (ref 3.87–5.11)
RDW: 14.1 % (ref 11.5–15.5)
WBC: 9.1 10*3/uL (ref 4.0–10.5)
nRBC: 0 % (ref 0.0–0.2)

## 2021-05-02 LAB — TROPONIN I (HIGH SENSITIVITY): Troponin I (High Sensitivity): 9 ng/L (ref <18)

## 2021-05-02 NOTE — ED Provider Notes (Signed)
Emergency Medicine Provider Triage Evaluation Note  Ashley Watts , a 60 y.o. female  was evaluated in triage.  Pt complains of right-sided facial numbness, arm numbness, and right arm numbness with progressively worsening speech over the last month.  Denies any history of stroke, history of diabetic neuropathy.  She brought in her mother today and decided to be evaluated for this.  Review of Systems  Positive: As above  Negative:  As above  Physical Exam  There were no vitals taken for this visit. Gen:   Awake, no distress   Resp:  Normal effort MSK:   Moves extremities without difficulty  Other:  CN 2-12 intact, negative pronator drift, negative heel to shin   Medical Decision Making  Medically screening exam initiated at 8:00 PM.  Appropriate orders placed.  Ashley Watts was informed that the remainder of the evaluation will be completed by another provider, this initial triage assessment does not replace that evaluation, and the importance of remaining in the ED until their evaluation is complete.    Ashley Watts 05/02/21 2002    Ashley Files, MD 05/03/21 1003

## 2021-05-02 NOTE — ED Triage Notes (Signed)
The pt is c/o rt face numbness and rt arm numbness for one month  She has not seen her doctor

## 2021-05-03 DIAGNOSIS — R202 Paresthesia of skin: Secondary | ICD-10-CM | POA: Diagnosis not present

## 2021-05-03 LAB — TROPONIN I (HIGH SENSITIVITY): Troponin I (High Sensitivity): 11 ng/L (ref <18)

## 2021-05-03 MED ORDER — LISINOPRIL 20 MG PO TABS
20.0000 mg | ORAL_TABLET | Freq: Once | ORAL | Status: AC
  Administered 2021-05-03: 20 mg via ORAL
  Filled 2021-05-03: qty 1

## 2021-05-03 MED ORDER — CARVEDILOL 12.5 MG PO TABS
12.5000 mg | ORAL_TABLET | Freq: Once | ORAL | Status: AC
  Administered 2021-05-03: 12.5 mg via ORAL
  Filled 2021-05-03: qty 1

## 2021-05-03 NOTE — ED Notes (Signed)
Patient verbalized understanding of discharge instructions. Opportunity for questions and answers.  

## 2021-05-03 NOTE — ED Notes (Signed)
Son Ashley Watts request he doesn't want his mom leaving if heavily medicated, he also stated he has known her to lie to doctors/nurses about having a way home but really driving. He said please give him a call is any questions or concerns [phone]

## 2021-05-03 NOTE — ED Notes (Signed)
Pt informed to take her bp medications in the morning per md cardama

## 2021-05-03 NOTE — ED Provider Notes (Signed)
[facility] EMERGENCY DEPARTMENT Provider Note  CSN: [id number] Arrival date & time: 05/02/21 1924  Chief Complaint(s) No chief complaint on file.  HPI Ashley Watts is a 60 y.o. female with a past medical history listed below who presents to the emergency department with 1 month of intermittent right upper extremity numbness and tingling and difficulty speaking.  She reports that initially this would occur every other day and resolved.  Tends to occur whenever she is "stressed out."  Over the past week or two, the symptoms have been coming on more frequently.  Reports that her numbness and tingling is very faint at the moment. She denies any visual changes. No headaches. No weakness. No trouble walking. No chest pain or shortness of breath. No nausea or vomiting. No other physical complaints.  During triage patient was noted to be hypertensive.  She reported that she did not take her blood pressure medicine today.  She denies any changes to her medications.  HPI  Past Medical History Past Medical History:  Diagnosis Date  . Anxiety   . Arthritis   . Chest pain 11/2012  . CHF (congestive heart failure) ([place])   . Depression   . Diabetes mellitus without complication ([place])   . Headache(784.0)   . Hypertension   . Hypothyroidism   . Neuromuscular disorder ([place])    DIABETIC NEUROPATHY  . Shortness of breath    Patient Active Problem List   Diagnosis Date Noted  . Gastroesophageal reflux disease without esophagitis 05/10/2019  . Hyperlipidemia associated with type 2 diabetes mellitus ([place]) 05/10/2019  . Chronic midline thoracic back pain 03/20/2019  . Hypothyroid 12/18/2018  . NSTEMI (non-ST elevated myocardial infarction) ([place]) 09/16/2017  . Elevated troponin   . Chest pain 09/15/2017  . Left sided numbness 06/19/2013  . Hypertension associated with diabetes ([place]) 04/29/2013  . TIA (transient ischemic attack) 04/29/2013  . Chronic systolic heart  failure ([place]) 12/04/2012  . Depression, recurrent ([place]) 11/21/2012  . Diabetes ([place]) 11/21/2012   Home Medication(s) Prior to Admission medications   Medication Sig Start Date End Date Taking? Authorizing Provider  aspirin EC 81 MG EC tablet Take 1 tablet (81 mg total) by mouth daily. 09/18/17  Yes Regalado, Belkys A, MD  blood glucose meter kit and supplies Dispense based on patient and insurance preference. Use up to four times daily as directed. (FOR ICD-10 E10.9, E11.9). 12/18/18  Yes Eustaquio Maize, MD  Blood Glucose Monitoring Suppl (ONETOUCH VERIO) w/Device KIT 1 kit by Does not apply route 4 (four) times daily. 12/18/18  Yes Eustaquio Maize, MD  carvedilol (COREG) 12.5 MG tablet Take 1 tablet (12.5 mg total) by mouth 2 (two) times daily with a meal. 05/10/19  Yes [name], Connye Burkitt, [name]  DULoxetine (CYMBALTA) 60 MG capsule Take 60 mg by mouth daily. 02/08/21  Yes [provider]  fluticasone (FLONASE) 50 MCG/ACT nasal spray Place 2 sprays into both nostrils daily. Patient taking differently: Place 2 sprays into both nostrils daily as needed for allergies. 01/08/19  Yes [name], Connye Burkitt, [name]  furosemide (LASIX) 40 MG tablet Take 1 tablet (40 mg total) by mouth daily. 05/10/19  Yes Baruch Gouty, [name]  GLUCOPHAGE 1000 MG tablet Take 1 tablet (1,000 mg total) by mouth 2 (two) times daily with a meal. 05/10/19  Yes [name], Connye Burkitt, [name]  glucose blood (ACCU-CHEK AVIVA) test strip Check glucose up to QID Dx E11.9 12/25/19  Yes [name], Connye Burkitt, [name]  HYDROcodone-acetaminophen (NORCO/VICODIN)  5-325 MG tablet Take 1 tablet by mouth 2 (two) times daily as needed for moderate pain. 04/16/21  Yes [provider]  JANUVIA 100 MG tablet Take 100 mg by mouth daily. 04/01/21  Yes [provider]  Lancets (ACCU-CHEK SOFT TOUCH) lancets Use as instructed 12/18/18  Yes Eustaquio Maize, MD  LANTUS SOLOSTAR 100 UNIT/ML Solostar Pen INJECT 50 UNITS AT BEDTIME Patient taking differently: Inject 50 Units  into the skin at bedtime. 12/24/19  Yes [name], Connye Burkitt, [name]  LINZESS 145 MCG CAPS capsule Take 145 mcg by mouth daily. 11/23/20  Yes [provider]  lisinopril (ZESTRIL) 40 MG tablet Take 1 tablet (40 mg total) by mouth daily. 05/10/19  Yes [name], Connye Burkitt, [name]  pantoprazole (PROTONIX) 40 MG tablet Take 1 tablet (40 mg total) by mouth daily. 05/10/19  Yes [name], Connye Burkitt, [name]  simvastatin (ZOCOR) 10 MG tablet Take 1 tablet (10 mg total) by mouth daily at 6 PM. 05/10/19  Yes [name], Connye Burkitt, [name]  SYNTHROID 112 MCG tablet Take 112 mcg by mouth daily. 03/10/21  Yes [provider]  traMADol (ULTRAM) 50 MG tablet Take 50 mg by mouth every 6 (six) hours as needed for moderate pain.   Yes [provider]  DULoxetine (CYMBALTA) 30 MG capsule Take 1 capsule (30 mg total) by mouth daily. (Needs to be seen before next refill) Patient not taking: Reported on 05/03/2021 11/12/19   Baruch Gouty, [name]  levothyroxine (SYNTHROID) 125 MCG tablet Take 1 tablet (125 mcg total) by mouth daily. Patient not taking: Reported on 05/03/2021 05/12/19   Baruch Gouty, [name]                                                                                                                                    Past Surgical History Past Surgical History:  Procedure Laterality Date  . ABDOMINAL HYSTERECTOMY    . LEFT AND RIGHT HEART CATHETERIZATION WITH CORONARY ANGIOGRAM N/A 11/20/2012   Procedure: LEFT AND RIGHT HEART CATHETERIZATION WITH CORONARY ANGIOGRAM;  Surgeon: Peter M Martinique, MD;  Location[facility] CATH LAB;  Service: Cardiovascular;  Laterality: N/A;   Family History Family History  Problem Relation Age of Onset  . Heart attack Father 86    Social History Social History   Tobacco Use  . Smoking status: Never Smoker  . Smokeless tobacco: Never Used  Vaping Use  . Vaping Use: Never used  Substance Use Topics  . Alcohol use: No  . Drug use: No   Allergies Codeine, Morphine and related, and  Hydrocodone  Review of Systems Review of Systems All other systems are reviewed and are negative for acute change except as noted in the HPI  Physical Exam Vital Signs  I have reviewed the triage vital signs BP (!) 168/89   Pulse 83   Temp 98.4 F (36.9 C) (Oral)   Resp (!) 24  Ht _0  (1.651 m)   Wt 88.5 kg   LMP 05/02/2021   SpO2 100%   BMI 32.47 kg/m   Physical Exam Vitals reviewed.  Constitutional:      General: She is not in acute distress.    Appearance: She is well-developed. She is not diaphoretic.  HENT:     Head: Normocephalic and atraumatic.     Right Ear: External ear normal.     Left Ear: External ear normal.     Nose: Nose normal.  Eyes:     General: No scleral icterus.    Conjunctiva/sclera: Conjunctivae normal.  Neck:     Trachea: Phonation normal.  Cardiovascular:     Rate and Rhythm: Normal rate and regular rhythm.  Pulmonary:     Effort: Pulmonary effort is normal. No respiratory distress.     Breath sounds: No stridor.  Abdominal:     General: There is no distension.  Musculoskeletal:        General: Normal range of motion.     Cervical back: Normal range of motion.  Neurological:     Mental Status: She is alert and oriented to person, place, and time.     Comments: Mental Status:  Alert and oriented to person, place, and time.  Attention and concentration normal.  Speech clear.  Recent memory is intact  Cranial Nerves:  II Visual Fields: Intact to confrontation. Visual fields intact. III, IV, VI: Pupils equal and reactive to light and near. Full eye movement without nystagmus  V Facial Sensation: Normal. No weakness of masticatory muscles  VII: No facial weakness or asymmetry  VIII Auditory Acuity: Grossly normal  IX/X: The uvula is midline; the palate elevates symmetrically  XI: Normal sternocleidomastoid and trapezius strength  XII: The tongue is midline. No atrophy or fasciculations.   Motor System: Muscle Strength: 5/5 and  symmetric in the upper and lower extremities. No pronation or drift.  Muscle Tone: Tone and muscle bulk are normal in the upper and lower extremities.   Reflexes: . No Clonus Coordination: Intact finger-to-nose. No tremor.  Sensation: Intact to light touch.  Gait: Routine  gait normal.   Psychiatric:        Behavior: Behavior normal.     ED Results and Treatments Labs (all labs ordered are listed, but only abnormal results are displayed) Labs Reviewed  CBC - Abnormal; Notable for the following components:      Result Value   RBC 3.77 (*)    Hemoglobin 10.5 (*)    HCT 33.6 (*)    All other components within normal limits  BASIC METABOLIC PANEL - Abnormal; Notable for the following components:   Glucose, Bld 278 (*)    All other components within normal limits  TROPONIN I (HIGH SENSITIVITY)  TROPONIN I (HIGH SENSITIVITY)  EKG  EKG Interpretation  Date/Time:    Ventricular Rate:    PR Interval:    QRS Duration:   QT Interval:    QTC Calculation:   R Axis:     Text Interpretation:        Radiology CT Head Wo Contrast  Result Date: 05/02/2021 CLINICAL DATA:  Right-sided facial numbness, arm numbness, and right arm numbness with progressively worsening speech over the last month. Denies any history of stroke, history of diabetic neuropathy. EXAM: CT HEAD WITHOUT CONTRAST TECHNIQUE: Contiguous axial images were obtained from the base of the skull through the vertex without intravenous contrast. COMPARISON:  04/29/2013. FINDINGS: Brain: No evidence of acute infarction, hemorrhage, hydrocephalus, extra-axial collection or mass lesion/mass effect. Small old infarct extending from the superior left internal capsule to the left centrum semiovale. Patchy white matter hypoattenuation consistent with small vessel ischemic change. Infarct stable from prior CT. Ischemic  change has mildly increased. Vascular: No hyperdense vessel or unexpected calcification. Skull: Normal. Negative for fracture or focal lesion. Sinuses/Orbits: Globes and orbits are unremarkable. Sinuses are clear. Other: None. IMPRESSION: 1. No acute intracranial abnormalities. 2. Old left basal ganglia to centrum semiovale lacunar infarct. Mild chronic microvascular ischemic change. Electronically Signed   By: Lajean Manes M.D.   On: 05/02/2021 20:52    Pertinent labs & imaging results that were available during my care of the patient were reviewed by me and considered in my medical decision making (see chart for details).  Medications Ordered in ED Medications  lisinopril (ZESTRIL) tablet 20 mg (20 mg Oral Given 05/03/21 0049)  carvedilol (COREG) tablet 12.5 mg (12.5 mg Oral Given 05/03/21 0050)                                                                                                                                    Procedures Procedures  (including critical care time)  Medical Decision Making / ED Course I have reviewed the nursing notes for this encounter and the patient's prior records (if available in EHR or on provided paperwork).   GALI SPINNEY was evaluated in Emergency Department on 05/03/2021 for the symptoms described in the history of present illness. She was evaluated in the context of the global COVID-19 pandemic, which necessitated consideration that the patient might be at risk for infection with the SARS-CoV-2 virus that causes COVID-19. Institutional protocols and algorithms that pertain to the evaluation of patients at risk for COVID-19 are in a state of rapid change based on information released by regulatory bodies including the CDC and federal and state organizations. These policies and algorithms were followed during the patient's care in the ED.  Patient presents with intermittent right arm paresthesias and difficulty speaking. No focal deficits noted on  exam. Patient seen in the MSE process and had labs and imaging obtained. Labs are grossly reassuring without significant electrolyte derangements. CT head without any acute process but did  reveal stable remote lacunar infarcts. On record review patient had an MRI in 2014 showing infarcts.  Given the intermittent nature of the patient's symptoms, it is unlikely that this is a new stroke.  Since its been going on for 1 month, we should see some new changes on the CAT scan as well. These may be stress related symptoms, or possible recrudescence. No need for emergent MRI at this time. Patient already on aspirin. Recommend she follow-up closely with her primary care provider. Provided with contact information for neurology.  Patient was given home dose of blood pressure medications which significantly improved her BP down to systolics of 521V.      Final Clinical Impression(s) / ED Diagnoses Final diagnoses:  Numbness and tingling of right arm   The patient appears reasonably screened and/or stabilized for discharge and I doubt any other medical condition or other [facility] requiring further screening, evaluation, or treatment in the ED at this time prior to discharge. Safe for discharge with strict return precautions.  Disposition: Discharge  Condition: Good  I have discussed the results, Dx and Tx plan with the patient/family who expressed understanding and agree(s) with the plan. Discharge instructions discussed at length. The patient/family was given strict return precautions who verbalized understanding of the instructions. No further questions at time of discharge.    ED Discharge Orders    None       Follow Up: Valinda Hoar, PA-C [address] [address] [phone]  Call  to schedule an appointment for close follow up, in 3-5 days  [place] [address]     [address] [place]  [address] [phone] Call  to schedule an appointment for close follow up      This chart was dictated using voice recognition software.  Despite best efforts to proofread,  errors can occur which can change the documentation meaning.   Fatima Blank, MD 05/03/21 [phone]

## 2021-05-07 DIAGNOSIS — E1165 Type 2 diabetes mellitus with hyperglycemia: Secondary | ICD-10-CM | POA: Diagnosis not present

## 2021-05-07 DIAGNOSIS — Z79899 Other long term (current) drug therapy: Secondary | ICD-10-CM | POA: Diagnosis not present

## 2021-05-07 DIAGNOSIS — I10 Essential (primary) hypertension: Secondary | ICD-10-CM | POA: Diagnosis not present

## 2021-05-07 DIAGNOSIS — M542 Cervicalgia: Secondary | ICD-10-CM | POA: Diagnosis not present

## 2021-05-07 DIAGNOSIS — M19011 Primary osteoarthritis, right shoulder: Secondary | ICD-10-CM | POA: Diagnosis not present

## 2021-05-07 DIAGNOSIS — M545 Low back pain, unspecified: Secondary | ICD-10-CM | POA: Diagnosis not present

## 2021-05-14 DIAGNOSIS — E785 Hyperlipidemia, unspecified: Secondary | ICD-10-CM | POA: Diagnosis not present

## 2021-05-14 DIAGNOSIS — I6521 Occlusion and stenosis of right carotid artery: Secondary | ICD-10-CM | POA: Diagnosis not present

## 2021-05-14 DIAGNOSIS — I63512 Cerebral infarction due to unspecified occlusion or stenosis of left middle cerebral artery: Secondary | ICD-10-CM | POA: Diagnosis not present

## 2021-05-14 DIAGNOSIS — R471 Dysarthria and anarthria: Secondary | ICD-10-CM | POA: Diagnosis not present

## 2021-05-14 DIAGNOSIS — E1165 Type 2 diabetes mellitus with hyperglycemia: Secondary | ICD-10-CM | POA: Diagnosis not present

## 2021-05-14 DIAGNOSIS — R479 Unspecified speech disturbances: Secondary | ICD-10-CM | POA: Diagnosis not present

## 2021-05-14 DIAGNOSIS — I509 Heart failure, unspecified: Secondary | ICD-10-CM | POA: Diagnosis not present

## 2021-05-14 DIAGNOSIS — R29707 NIHSS score 7: Secondary | ICD-10-CM | POA: Diagnosis not present

## 2021-05-14 DIAGNOSIS — I517 Cardiomegaly: Secondary | ICD-10-CM | POA: Diagnosis not present

## 2021-05-14 DIAGNOSIS — I672 Cerebral atherosclerosis: Secondary | ICD-10-CM | POA: Diagnosis not present

## 2021-05-14 DIAGNOSIS — Z7409 Other reduced mobility: Secondary | ICD-10-CM | POA: Diagnosis not present

## 2021-05-14 DIAGNOSIS — E039 Hypothyroidism, unspecified: Secondary | ICD-10-CM | POA: Diagnosis not present

## 2021-05-14 DIAGNOSIS — R2689 Other abnormalities of gait and mobility: Secondary | ICD-10-CM | POA: Diagnosis not present

## 2021-05-14 DIAGNOSIS — I10 Essential (primary) hypertension: Secondary | ICD-10-CM | POA: Diagnosis not present

## 2021-05-14 DIAGNOSIS — Z794 Long term (current) use of insulin: Secondary | ICD-10-CM | POA: Diagnosis not present

## 2021-05-14 DIAGNOSIS — R29810 Facial weakness: Secondary | ICD-10-CM | POA: Diagnosis not present

## 2021-05-14 DIAGNOSIS — E1140 Type 2 diabetes mellitus with diabetic neuropathy, unspecified: Secondary | ICD-10-CM | POA: Diagnosis not present

## 2021-05-14 DIAGNOSIS — E119 Type 2 diabetes mellitus without complications: Secondary | ICD-10-CM | POA: Diagnosis not present

## 2021-05-14 DIAGNOSIS — I639 Cerebral infarction, unspecified: Secondary | ICD-10-CM | POA: Diagnosis not present

## 2021-05-14 DIAGNOSIS — I110 Hypertensive heart disease with heart failure: Secondary | ICD-10-CM | POA: Diagnosis not present

## 2021-05-14 DIAGNOSIS — R531 Weakness: Secondary | ICD-10-CM | POA: Diagnosis not present

## 2021-05-14 DIAGNOSIS — R4701 Aphasia: Secondary | ICD-10-CM | POA: Diagnosis not present

## 2021-05-15 DIAGNOSIS — E039 Hypothyroidism, unspecified: Secondary | ICD-10-CM | POA: Diagnosis not present

## 2021-05-15 DIAGNOSIS — E1140 Type 2 diabetes mellitus with diabetic neuropathy, unspecified: Secondary | ICD-10-CM | POA: Diagnosis not present

## 2021-05-15 DIAGNOSIS — R531 Weakness: Secondary | ICD-10-CM | POA: Diagnosis not present

## 2021-05-15 DIAGNOSIS — Z794 Long term (current) use of insulin: Secondary | ICD-10-CM | POA: Diagnosis not present

## 2021-05-15 DIAGNOSIS — R2689 Other abnormalities of gait and mobility: Secondary | ICD-10-CM | POA: Diagnosis not present

## 2021-05-15 DIAGNOSIS — I639 Cerebral infarction, unspecified: Secondary | ICD-10-CM | POA: Diagnosis not present

## 2021-05-15 DIAGNOSIS — Z7409 Other reduced mobility: Secondary | ICD-10-CM | POA: Diagnosis not present

## 2021-05-15 DIAGNOSIS — I10 Essential (primary) hypertension: Secondary | ICD-10-CM | POA: Diagnosis not present

## 2021-06-03 DIAGNOSIS — I10 Essential (primary) hypertension: Secondary | ICD-10-CM | POA: Diagnosis not present

## 2021-06-03 DIAGNOSIS — Z7409 Other reduced mobility: Secondary | ICD-10-CM | POA: Diagnosis not present

## 2021-06-03 DIAGNOSIS — E1165 Type 2 diabetes mellitus with hyperglycemia: Secondary | ICD-10-CM | POA: Diagnosis not present

## 2021-06-03 DIAGNOSIS — I69398 Other sequelae of cerebral infarction: Secondary | ICD-10-CM | POA: Diagnosis not present

## 2021-06-03 DIAGNOSIS — M542 Cervicalgia: Secondary | ICD-10-CM | POA: Diagnosis not present

## 2021-06-03 DIAGNOSIS — I639 Cerebral infarction, unspecified: Secondary | ICD-10-CM | POA: Diagnosis not present

## 2021-06-03 DIAGNOSIS — M19011 Primary osteoarthritis, right shoulder: Secondary | ICD-10-CM | POA: Diagnosis not present

## 2021-06-03 DIAGNOSIS — M545 Low back pain, unspecified: Secondary | ICD-10-CM | POA: Diagnosis not present

## 2021-06-03 DIAGNOSIS — R269 Unspecified abnormalities of gait and mobility: Secondary | ICD-10-CM | POA: Diagnosis not present

## 2021-06-03 DIAGNOSIS — Z79899 Other long term (current) drug therapy: Secondary | ICD-10-CM | POA: Diagnosis not present

## 2021-06-03 DIAGNOSIS — I63512 Cerebral infarction due to unspecified occlusion or stenosis of left middle cerebral artery: Secondary | ICD-10-CM | POA: Diagnosis not present

## 2021-06-24 DIAGNOSIS — I10 Essential (primary) hypertension: Secondary | ICD-10-CM | POA: Diagnosis not present

## 2021-06-24 DIAGNOSIS — I639 Cerebral infarction, unspecified: Secondary | ICD-10-CM | POA: Diagnosis not present

## 2021-06-24 DIAGNOSIS — M545 Low back pain, unspecified: Secondary | ICD-10-CM | POA: Diagnosis not present

## 2021-06-24 DIAGNOSIS — E559 Vitamin D deficiency, unspecified: Secondary | ICD-10-CM | POA: Diagnosis not present

## 2021-06-24 DIAGNOSIS — E0590 Thyrotoxicosis, unspecified without thyrotoxic crisis or storm: Secondary | ICD-10-CM | POA: Diagnosis not present

## 2021-06-24 DIAGNOSIS — Z79899 Other long term (current) drug therapy: Secondary | ICD-10-CM | POA: Diagnosis not present

## 2021-06-24 DIAGNOSIS — E1165 Type 2 diabetes mellitus with hyperglycemia: Secondary | ICD-10-CM | POA: Diagnosis not present

## 2021-06-24 DIAGNOSIS — E7800 Pure hypercholesterolemia, unspecified: Secondary | ICD-10-CM | POA: Diagnosis not present

## 2021-06-24 DIAGNOSIS — M19011 Primary osteoarthritis, right shoulder: Secondary | ICD-10-CM | POA: Diagnosis not present

## 2021-06-24 DIAGNOSIS — M542 Cervicalgia: Secondary | ICD-10-CM | POA: Diagnosis not present

## 2021-07-06 DIAGNOSIS — R269 Unspecified abnormalities of gait and mobility: Secondary | ICD-10-CM | POA: Diagnosis not present

## 2021-07-06 DIAGNOSIS — I63512 Cerebral infarction due to unspecified occlusion or stenosis of left middle cerebral artery: Secondary | ICD-10-CM | POA: Diagnosis not present

## 2021-07-06 DIAGNOSIS — I69398 Other sequelae of cerebral infarction: Secondary | ICD-10-CM | POA: Diagnosis not present

## 2021-07-06 DIAGNOSIS — Z7409 Other reduced mobility: Secondary | ICD-10-CM | POA: Diagnosis not present

## 2021-07-15 DIAGNOSIS — Z7409 Other reduced mobility: Secondary | ICD-10-CM | POA: Diagnosis not present

## 2021-07-15 DIAGNOSIS — I69398 Other sequelae of cerebral infarction: Secondary | ICD-10-CM | POA: Diagnosis not present

## 2021-07-15 DIAGNOSIS — I63512 Cerebral infarction due to unspecified occlusion or stenosis of left middle cerebral artery: Secondary | ICD-10-CM | POA: Diagnosis not present

## 2021-07-15 DIAGNOSIS — R269 Unspecified abnormalities of gait and mobility: Secondary | ICD-10-CM | POA: Diagnosis not present

## 2021-07-19 DIAGNOSIS — E039 Hypothyroidism, unspecified: Secondary | ICD-10-CM | POA: Diagnosis not present

## 2021-07-19 DIAGNOSIS — Z8673 Personal history of transient ischemic attack (TIA), and cerebral infarction without residual deficits: Secondary | ICD-10-CM | POA: Diagnosis not present

## 2021-07-19 DIAGNOSIS — Z794 Long term (current) use of insulin: Secondary | ICD-10-CM | POA: Diagnosis not present

## 2021-07-19 DIAGNOSIS — E1165 Type 2 diabetes mellitus with hyperglycemia: Secondary | ICD-10-CM | POA: Diagnosis not present

## 2021-07-20 DIAGNOSIS — R269 Unspecified abnormalities of gait and mobility: Secondary | ICD-10-CM | POA: Diagnosis not present

## 2021-07-20 DIAGNOSIS — Z7409 Other reduced mobility: Secondary | ICD-10-CM | POA: Diagnosis not present

## 2021-07-20 DIAGNOSIS — I63512 Cerebral infarction due to unspecified occlusion or stenosis of left middle cerebral artery: Secondary | ICD-10-CM | POA: Diagnosis not present

## 2021-07-20 DIAGNOSIS — I69398 Other sequelae of cerebral infarction: Secondary | ICD-10-CM | POA: Diagnosis not present

## 2021-07-21 DIAGNOSIS — I63512 Cerebral infarction due to unspecified occlusion or stenosis of left middle cerebral artery: Secondary | ICD-10-CM | POA: Diagnosis not present

## 2021-07-21 DIAGNOSIS — R4701 Aphasia: Secondary | ICD-10-CM | POA: Diagnosis not present

## 2021-07-23 DIAGNOSIS — M25531 Pain in right wrist: Secondary | ICD-10-CM | POA: Diagnosis not present

## 2021-07-23 DIAGNOSIS — M542 Cervicalgia: Secondary | ICD-10-CM | POA: Diagnosis not present

## 2021-07-23 DIAGNOSIS — M19011 Primary osteoarthritis, right shoulder: Secondary | ICD-10-CM | POA: Diagnosis not present

## 2021-07-23 DIAGNOSIS — I639 Cerebral infarction, unspecified: Secondary | ICD-10-CM | POA: Diagnosis not present

## 2021-07-23 DIAGNOSIS — Z79899 Other long term (current) drug therapy: Secondary | ICD-10-CM | POA: Diagnosis not present

## 2021-07-23 DIAGNOSIS — I10 Essential (primary) hypertension: Secondary | ICD-10-CM | POA: Diagnosis not present

## 2021-07-23 DIAGNOSIS — M545 Low back pain, unspecified: Secondary | ICD-10-CM | POA: Diagnosis not present

## 2021-08-26 DIAGNOSIS — I10 Essential (primary) hypertension: Secondary | ICD-10-CM | POA: Diagnosis not present

## 2021-08-26 DIAGNOSIS — M542 Cervicalgia: Secondary | ICD-10-CM | POA: Diagnosis not present

## 2021-08-26 DIAGNOSIS — M19011 Primary osteoarthritis, right shoulder: Secondary | ICD-10-CM | POA: Diagnosis not present

## 2021-08-26 DIAGNOSIS — I639 Cerebral infarction, unspecified: Secondary | ICD-10-CM | POA: Diagnosis not present

## 2021-08-26 DIAGNOSIS — M545 Low back pain, unspecified: Secondary | ICD-10-CM | POA: Diagnosis not present

## 2021-08-26 DIAGNOSIS — Z79899 Other long term (current) drug therapy: Secondary | ICD-10-CM | POA: Diagnosis not present

## 2021-08-26 DIAGNOSIS — E1165 Type 2 diabetes mellitus with hyperglycemia: Secondary | ICD-10-CM | POA: Diagnosis not present

## 2021-09-27 DIAGNOSIS — M545 Low back pain, unspecified: Secondary | ICD-10-CM | POA: Diagnosis not present

## 2021-09-27 DIAGNOSIS — E1165 Type 2 diabetes mellitus with hyperglycemia: Secondary | ICD-10-CM | POA: Diagnosis not present

## 2021-09-27 DIAGNOSIS — M19011 Primary osteoarthritis, right shoulder: Secondary | ICD-10-CM | POA: Diagnosis not present

## 2021-09-27 DIAGNOSIS — I639 Cerebral infarction, unspecified: Secondary | ICD-10-CM | POA: Diagnosis not present

## 2021-09-27 DIAGNOSIS — Z79899 Other long term (current) drug therapy: Secondary | ICD-10-CM | POA: Diagnosis not present

## 2021-09-27 DIAGNOSIS — I10 Essential (primary) hypertension: Secondary | ICD-10-CM | POA: Diagnosis not present

## 2021-09-27 DIAGNOSIS — M542 Cervicalgia: Secondary | ICD-10-CM | POA: Diagnosis not present

## 2021-11-06 DIAGNOSIS — E1165 Type 2 diabetes mellitus with hyperglycemia: Secondary | ICD-10-CM | POA: Diagnosis not present

## 2021-11-06 DIAGNOSIS — E559 Vitamin D deficiency, unspecified: Secondary | ICD-10-CM | POA: Diagnosis not present

## 2021-11-06 DIAGNOSIS — R0602 Shortness of breath: Secondary | ICD-10-CM | POA: Diagnosis not present

## 2021-11-06 DIAGNOSIS — I10 Essential (primary) hypertension: Secondary | ICD-10-CM | POA: Diagnosis not present

## 2021-11-06 DIAGNOSIS — E7800 Pure hypercholesterolemia, unspecified: Secondary | ICD-10-CM | POA: Diagnosis not present

## 2021-11-06 DIAGNOSIS — I639 Cerebral infarction, unspecified: Secondary | ICD-10-CM | POA: Diagnosis not present

## 2021-11-06 DIAGNOSIS — M545 Low back pain, unspecified: Secondary | ICD-10-CM | POA: Diagnosis not present

## 2021-11-06 DIAGNOSIS — M19011 Primary osteoarthritis, right shoulder: Secondary | ICD-10-CM | POA: Diagnosis not present

## 2021-11-06 DIAGNOSIS — Z79899 Other long term (current) drug therapy: Secondary | ICD-10-CM | POA: Diagnosis not present

## 2021-11-06 DIAGNOSIS — M542 Cervicalgia: Secondary | ICD-10-CM | POA: Diagnosis not present

## 2021-11-09 DIAGNOSIS — Z9189 Other specified personal risk factors, not elsewhere classified: Secondary | ICD-10-CM | POA: Diagnosis not present

## 2021-11-09 DIAGNOSIS — J069 Acute upper respiratory infection, unspecified: Secondary | ICD-10-CM | POA: Diagnosis not present

## 2022-01-02 IMAGING — CT CT HEAD W/O CM
4 series · 16 of 47 positions shown, 18 images · non-contrast
Comparison: 04/29/2013.

CLINICAL DATA: Right-sided facial numbness, arm numbness, and right
arm numbness with progressively worsening speech over the last
month. Denies any history of stroke, history of diabetic neuropathy.

EXAM:
CT HEAD WITHOUT CONTRAST
TECHNIQUE: Contiguous axial images were obtained from the base of the skull
through the vertex without intravenous contrast.

[Series 3: head bone · axial · 0.46mm/px · z∈[+1189,+1225]mm · 3 of 88 slices shown]
[im 9/88  bone]
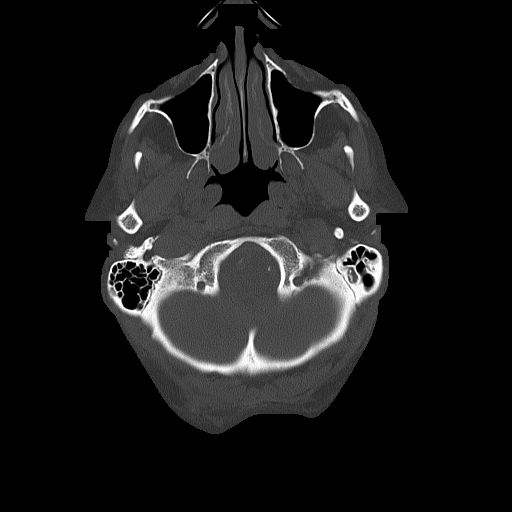
[im 18/88  bone]
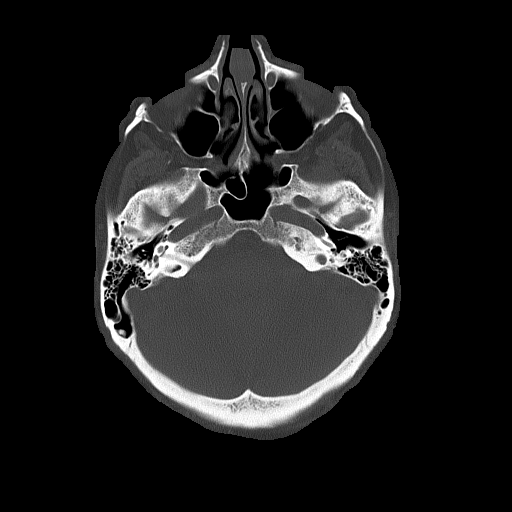
[im 27/88  bone]
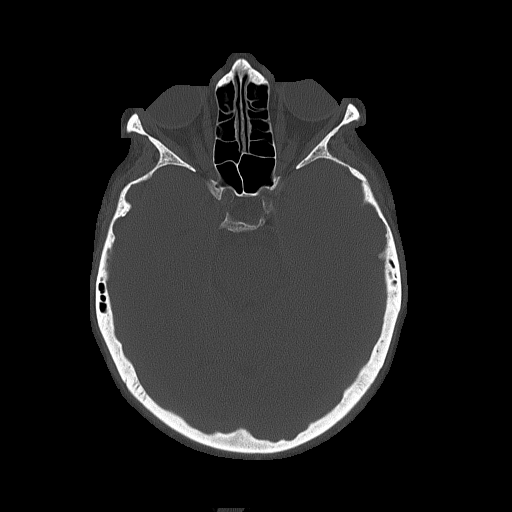

[Series 4: head without · axial · non-contrast · 0.46mm/px · z∈[+1193,+1323]mm · 7 of 36 slices shown, 9 images]
[im 5/36  brain]
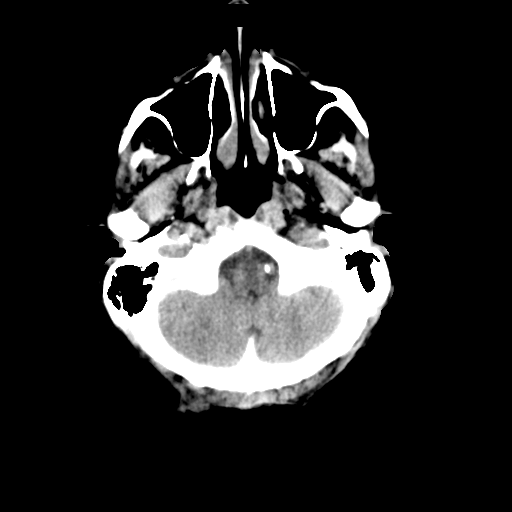
[im 5/36  bone]
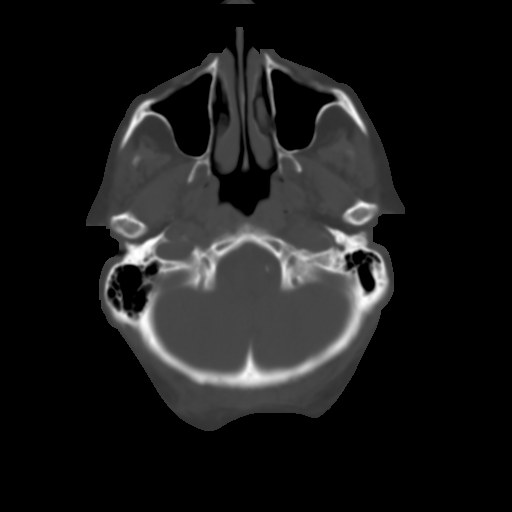
[im 9/36  brain]
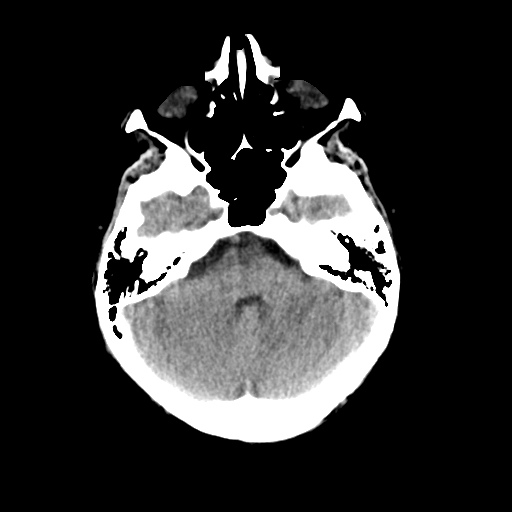
[im 14/36  brain]
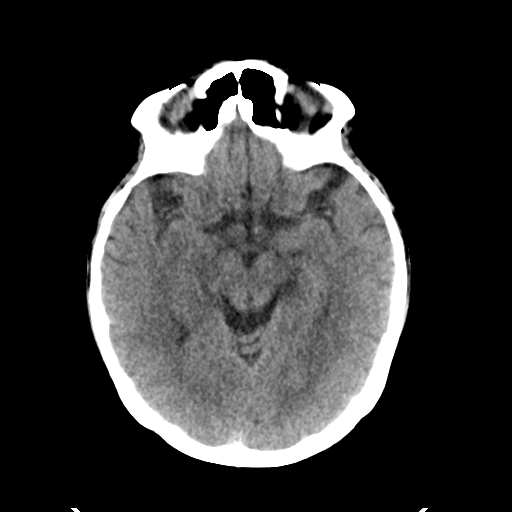
[im 18/36  brain]
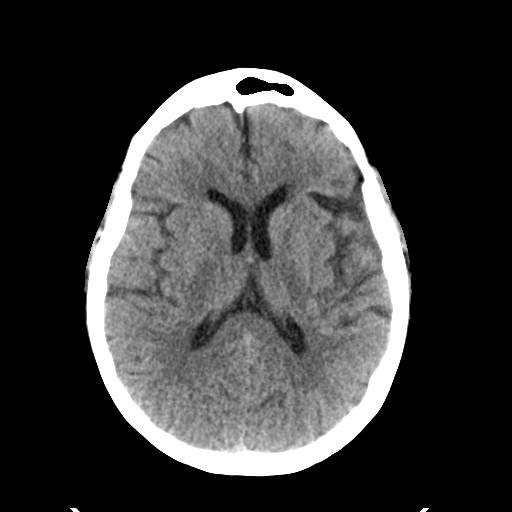
[im 22/36  brain]
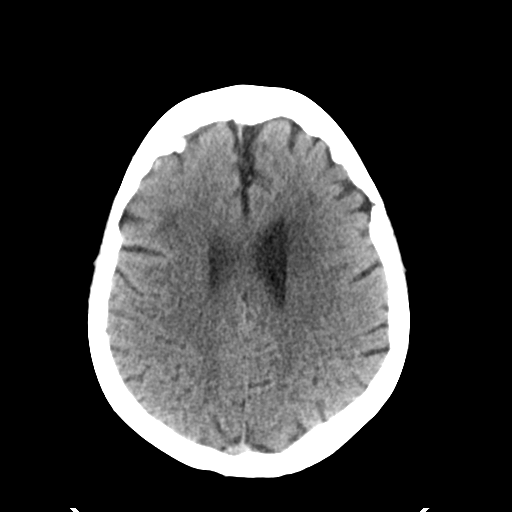
[im 22/36  bone]
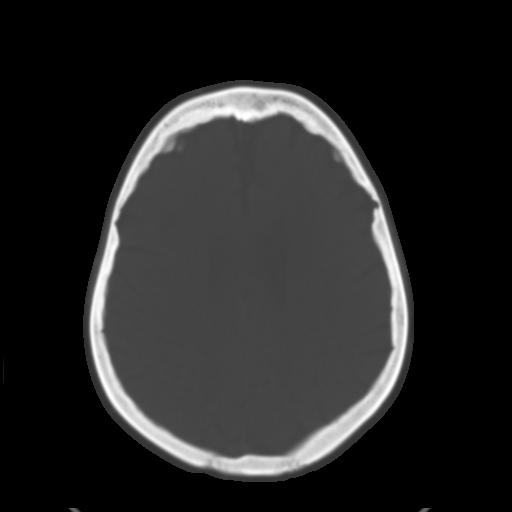
[im 27/36  brain]
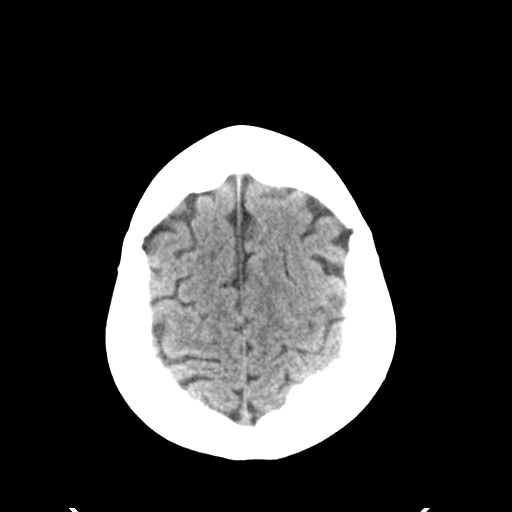
[im 31/36  brain]
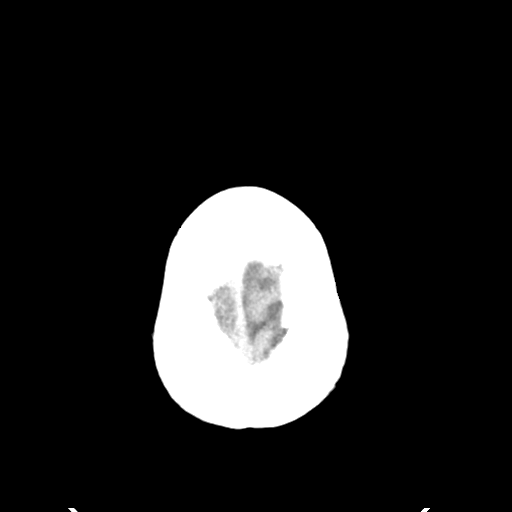

[Series 5: head without cor · coronal · non-contrast · 0.34mm/px · 3 of 70 slices shown]
[im 24/70  brain]
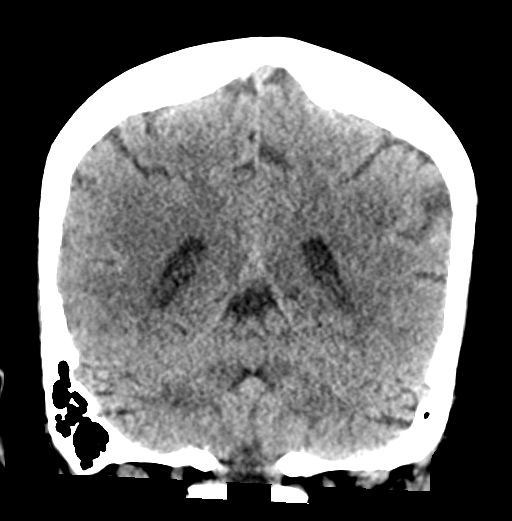
[im 31/70  brain]
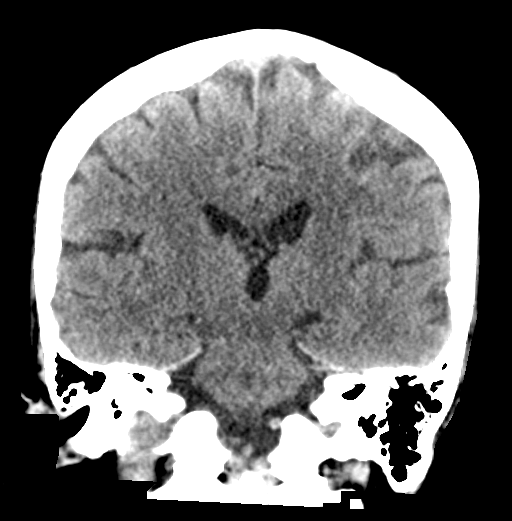
[im 39/70  brain]
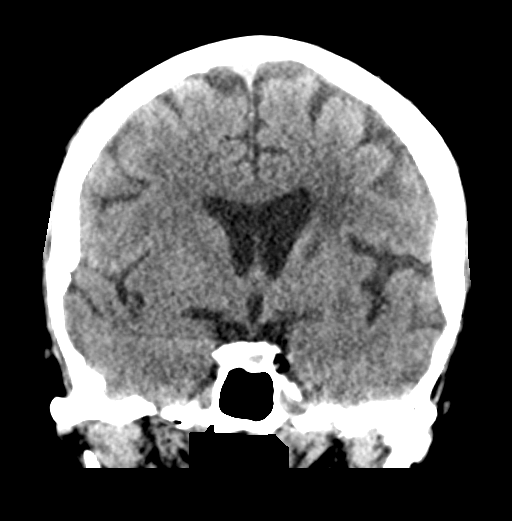

[Series 6: head without sag · sagittal · non-contrast · 0.34mm/px · 3 of 55 slices shown]
[im 19/55  brain]
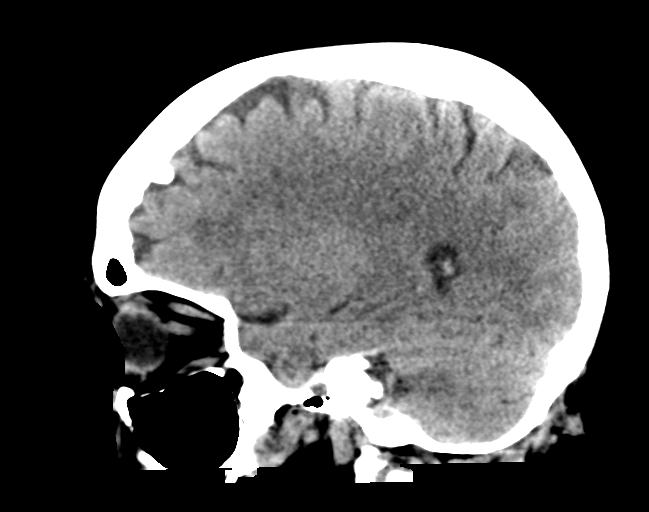
[im 28/55  brain]
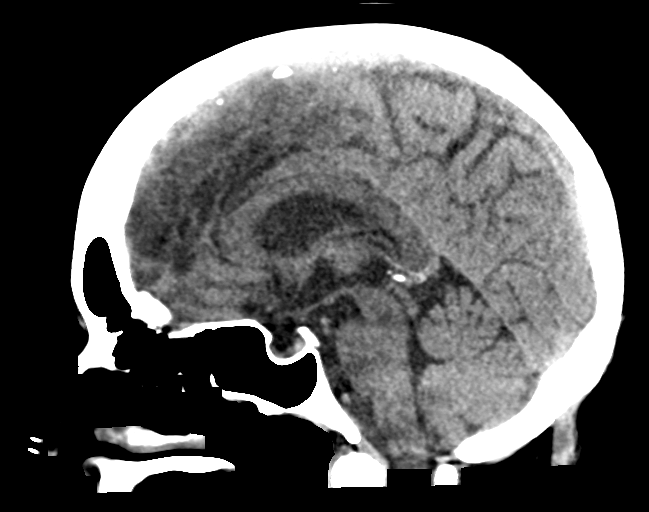
[im 37/55  brain]
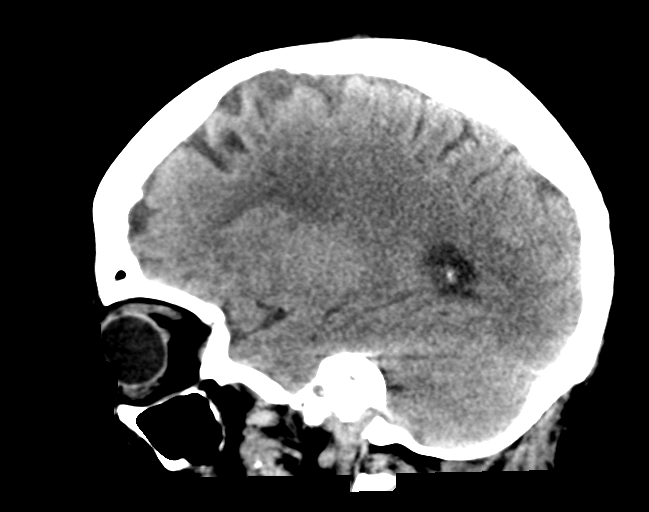

[16 of 47 positions shown; findings below may reference images not displayed]

FINDINGS: Brain: No evidence of acute infarction, hemorrhage, hydrocephalus,
extra-axial collection or mass lesion/mass effect.

Small old infarct extending from the superior left internal capsule
to the left centrum semiovale. Patchy white matter hypoattenuation
consistent with small vessel ischemic change. Infarct stable from
prior CT. Ischemic change has mildly increased.

Vascular: No hyperdense vessel or unexpected calcification.

Skull: Normal. Negative for fracture or focal lesion.

Sinuses/Orbits: Globes and orbits are unremarkable. Sinuses are
clear.

Other: None.
IMPRESSION: 1. No acute intracranial abnormalities.
2. Old left basal ganglia to centrum semiovale lacunar infarct. Mild
chronic microvascular ischemic change.

## 2023-04-24 NOTE — Telephone Encounter (Signed)
Erroneous encounter will close.

## 2024-03-05 ENCOUNTER — Other Ambulatory Visit: Payer: Self-pay

## 2024-03-05 ENCOUNTER — Encounter (HOSPITAL_COMMUNITY): Payer: Self-pay | Admitting: Emergency Medicine

## 2024-03-05 ENCOUNTER — Observation Stay (HOSPITAL_COMMUNITY)
Admission: EM | Admit: 2024-03-05 | Discharge: 2024-03-06 | Disposition: A | Discharge status: Home / Self Care | Attending: Internal Medicine | Admitting: Internal Medicine

## 2024-03-05 ENCOUNTER — Emergency Department (HOSPITAL_COMMUNITY)

## 2024-03-05 ENCOUNTER — Observation Stay (HOSPITAL_COMMUNITY)

## 2024-03-05 DIAGNOSIS — I152 Hypertension secondary to endocrine disorders: Secondary | ICD-10-CM

## 2024-03-05 DIAGNOSIS — F339 Major depressive disorder, recurrent, unspecified: Secondary | ICD-10-CM | POA: Insufficient documentation

## 2024-03-05 DIAGNOSIS — R7989 Other specified abnormal findings of blood chemistry: Secondary | ICD-10-CM

## 2024-03-05 DIAGNOSIS — I5022 Chronic systolic (congestive) heart failure: Secondary | ICD-10-CM | POA: Diagnosis not present

## 2024-03-05 DIAGNOSIS — Z1152 Encounter for screening for COVID-19: Secondary | ICD-10-CM | POA: Insufficient documentation

## 2024-03-05 DIAGNOSIS — I110 Hypertensive heart disease with heart failure: Secondary | ICD-10-CM | POA: Insufficient documentation

## 2024-03-05 DIAGNOSIS — N179 Acute kidney failure, unspecified: Secondary | ICD-10-CM | POA: Insufficient documentation

## 2024-03-05 DIAGNOSIS — R8290 Unspecified abnormal findings in urine: Secondary | ICD-10-CM | POA: Diagnosis not present

## 2024-03-05 DIAGNOSIS — E1159 Type 2 diabetes mellitus with other circulatory complications: Secondary | ICD-10-CM

## 2024-03-05 DIAGNOSIS — R55 Syncope and collapse: Secondary | ICD-10-CM | POA: Diagnosis not present

## 2024-03-05 DIAGNOSIS — E119 Type 2 diabetes mellitus without complications: Secondary | ICD-10-CM

## 2024-03-05 DIAGNOSIS — Z79899 Other long term (current) drug therapy: Secondary | ICD-10-CM | POA: Diagnosis not present

## 2024-03-05 DIAGNOSIS — E039 Hypothyroidism, unspecified: Secondary | ICD-10-CM | POA: Insufficient documentation

## 2024-03-05 DIAGNOSIS — I6930 Unspecified sequelae of cerebral infarction: Secondary | ICD-10-CM

## 2024-03-05 DIAGNOSIS — E785 Hyperlipidemia, unspecified: Secondary | ICD-10-CM | POA: Insufficient documentation

## 2024-03-05 DIAGNOSIS — Z8673 Personal history of transient ischemic attack (TIA), and cerebral infarction without residual deficits: Secondary | ICD-10-CM | POA: Insufficient documentation

## 2024-03-05 DIAGNOSIS — R399 Unspecified symptoms and signs involving the genitourinary system: Secondary | ICD-10-CM | POA: Diagnosis not present

## 2024-03-05 DIAGNOSIS — N390 Urinary tract infection, site not specified: Secondary | ICD-10-CM | POA: Insufficient documentation

## 2024-03-05 DIAGNOSIS — E1165 Type 2 diabetes mellitus with hyperglycemia: Secondary | ICD-10-CM | POA: Insufficient documentation

## 2024-03-05 DIAGNOSIS — E860 Dehydration: Secondary | ICD-10-CM | POA: Diagnosis not present

## 2024-03-05 DIAGNOSIS — Z7901 Long term (current) use of anticoagulants: Secondary | ICD-10-CM | POA: Insufficient documentation

## 2024-03-05 DIAGNOSIS — R739 Hyperglycemia, unspecified: Secondary | ICD-10-CM | POA: Diagnosis present

## 2024-03-05 DIAGNOSIS — E1169 Type 2 diabetes mellitus with other specified complication: Secondary | ICD-10-CM | POA: Diagnosis present

## 2024-03-05 DIAGNOSIS — K219 Gastro-esophageal reflux disease without esophagitis: Secondary | ICD-10-CM | POA: Insufficient documentation

## 2024-03-05 LAB — URINALYSIS, ROUTINE W REFLEX MICROSCOPIC
Bacteria, UA: NONE SEEN
Bilirubin Urine: NEGATIVE
Glucose, UA: >=500 mg/dL — AB
Ketones, ur: 5 mg/dL — AB
Nitrite: NEGATIVE
Protein, ur: NEGATIVE mg/dL
Specific Gravity, Urine: 1.028 (ref 1.005–1.030)
pH: 6 (ref 5.0–8.0)

## 2024-03-05 LAB — TROPONIN I (HIGH SENSITIVITY)
Troponin I (High Sensitivity): 63 ng/L — ABNORMAL HIGH (ref <18)
Troponin I (High Sensitivity): 53 ng/L — ABNORMAL HIGH (ref <18)

## 2024-03-05 LAB — GLUCOSE, CAPILLARY
Glucose-Capillary: 150 mg/dL — ABNORMAL HIGH (ref 70–99)
Glucose-Capillary: 165 mg/dL — ABNORMAL HIGH (ref 70–99)

## 2024-03-05 LAB — CBC WITH DIFFERENTIAL/PLATELET
Abs Immature Granulocytes: 0.02 10*3/uL (ref 0.00–0.07)
Basophils Absolute: 0.1 10*3/uL (ref 0.0–0.1)
Basophils Relative: 1 %
Eosinophils Absolute: 0.2 10*3/uL (ref 0.0–0.5)
Eosinophils Relative: 2 %
HCT: 37.4 % (ref 36.0–46.0)
Hemoglobin: 12.2 g/dL (ref 12.0–15.0)
Immature Granulocytes: 0 %
Lymphocytes Relative: 17 %
Lymphs Abs: 1.8 10*3/uL (ref 0.7–4.0)
MCH: 27.6 pg (ref 26.0–34.0)
MCHC: 32.6 g/dL (ref 30.0–36.0)
MCV: 84.6 fL (ref 80.0–100.0)
Monocytes Absolute: 0.6 10*3/uL (ref 0.1–1.0)
Monocytes Relative: 6 %
Neutro Abs: 7.8 10*3/uL — ABNORMAL HIGH (ref 1.7–7.7)
Neutrophils Relative %: 74 %
Platelets: 264 10*3/uL (ref 150–400)
RBC: 4.42 MIL/uL (ref 3.87–5.11)
RDW: 13.3 % (ref 11.5–15.5)
WBC: 10.4 10*3/uL (ref 4.0–10.5)
nRBC: 0 % (ref 0.0–0.2)

## 2024-03-05 LAB — RAPID URINE DRUG SCREEN, HOSP PERFORMED
Amphetamines: NOT DETECTED
Barbiturates: NOT DETECTED
Benzodiazepines: NOT DETECTED
Cocaine: NOT DETECTED
Opiates: NOT DETECTED
Tetrahydrocannabinol: NOT DETECTED

## 2024-03-05 LAB — RESP PANEL BY RT-PCR (RSV, FLU A&B, COVID)  RVPGX2
Influenza A by PCR: NEGATIVE
Influenza B by PCR: NEGATIVE
Resp Syncytial Virus by PCR: NEGATIVE
SARS Coronavirus 2 by RT PCR: NEGATIVE

## 2024-03-05 LAB — COMPREHENSIVE METABOLIC PANEL WITH GFR
ALT: 25 U/L (ref 0–44)
AST: 24 U/L (ref 15–41)
Albumin: 4.1 g/dL (ref 3.5–5.0)
Alkaline Phosphatase: 67 U/L (ref 38–126)
Anion gap: 11 (ref 5–15)
BUN: 26 mg/dL — ABNORMAL HIGH (ref 6–20)
CO2: 20 mmol/L — ABNORMAL LOW (ref 22–32)
Calcium: 9.5 mg/dL (ref 8.9–10.3)
Chloride: 105 mmol/L (ref 98–111)
Creatinine, Ser: 1.04 mg/dL — ABNORMAL HIGH (ref 0.44–1.00)
GFR, Estimated: >60 mL/min (ref >60)
Glucose, Bld: 221 mg/dL — ABNORMAL HIGH (ref 70–99)
Potassium: 3.9 mmol/L (ref 3.5–5.1)
Sodium: 136 mmol/L (ref 135–145)
Total Bilirubin: 1 mg/dL (ref 0.0–1.2)
Total Protein: 7.1 g/dL (ref 6.5–8.1)

## 2024-03-05 LAB — T4, FREE: Free T4: 1.14 ng/dL — ABNORMAL HIGH (ref 0.61–1.12)

## 2024-03-05 LAB — BRAIN NATRIURETIC PEPTIDE: B Natriuretic Peptide: 114 pg/mL — ABNORMAL HIGH (ref 0.0–100.0)

## 2024-03-05 LAB — PROCALCITONIN: Procalcitonin: <0.1 ng/mL

## 2024-03-05 LAB — MAGNESIUM
Magnesium: 1.7 mg/dL (ref 1.7–2.4)
Magnesium: 1.7 mg/dL (ref 1.7–2.4)

## 2024-03-05 LAB — TSH: TSH: 8.435 u[IU]/mL — ABNORMAL HIGH (ref 0.350–4.500)

## 2024-03-05 LAB — D-DIMER, QUANTITATIVE: D-Dimer, Quant: <0.27 ug{FEU}/mL (ref 0.00–0.50)

## 2024-03-05 MED ORDER — MAGNESIUM SULFATE 2 GM/50ML IV SOLN
2.0000 g | Freq: Once | INTRAVENOUS | Status: AC
  Administered 2024-03-05: 2 g via INTRAVENOUS
  Filled 2024-03-05: qty 50

## 2024-03-05 MED ORDER — FENTANYL CITRATE PF 50 MCG/ML IJ SOSY: 12.5000 ug | PREFILLED_SYRINGE | INTRAMUSCULAR | Status: DC | PRN

## 2024-03-05 MED ORDER — SODIUM CHLORIDE 0.9% FLUSH
3.0000 mL | Freq: Two times a day (BID) | INTRAVENOUS | Status: DC
  Administered 2024-03-05 (×2): 3 mL via INTRAVENOUS

## 2024-03-05 MED ORDER — LACTATED RINGERS IV SOLN: INTRAVENOUS | Status: AC

## 2024-03-05 MED ORDER — ONDANSETRON HCL 4 MG PO TABS: 4.0000 mg | ORAL_TABLET | Freq: Four times a day (QID) | ORAL | Status: DC | PRN

## 2024-03-05 MED ORDER — IBUPROFEN 600 MG PO TABS: 600.0000 mg | ORAL_TABLET | Freq: Four times a day (QID) | ORAL | Status: DC | PRN

## 2024-03-05 MED ORDER — TRAZODONE HCL 50 MG PO TABS
50.0000 mg | ORAL_TABLET | Freq: Every evening | ORAL | Status: DC | PRN
  Administered 2024-03-05: 50 mg via ORAL
  Filled 2024-03-05: qty 1

## 2024-03-05 MED ORDER — ACETAMINOPHEN 325 MG PO TABS: 650.0000 mg | ORAL_TABLET | Freq: Four times a day (QID) | ORAL | Status: DC | PRN

## 2024-03-05 MED ORDER — INSULIN ASPART 100 UNIT/ML IJ SOLN
0.0000 [IU] | Freq: Three times a day (TID) | INTRAMUSCULAR | Status: DC
  Administered 2024-03-05 – 2024-03-06 (×3): 3 [IU] via SUBCUTANEOUS

## 2024-03-05 MED ORDER — FOSFOMYCIN TROMETHAMINE 3 G PO PACK
3.0000 g | PACK | Freq: Once | ORAL | Status: AC
  Administered 2024-03-05: 3 g via ORAL
  Filled 2024-03-05: qty 3

## 2024-03-05 MED ORDER — PANTOPRAZOLE SODIUM 40 MG PO TBEC
40.0000 mg | DELAYED_RELEASE_TABLET | Freq: Every evening | ORAL | Status: DC
  Administered 2024-03-05 – 2024-03-06 (×2): 40 mg via ORAL
  Filled 2024-03-05 (×2): qty 1

## 2024-03-05 MED ORDER — SODIUM CHLORIDE 0.9 % IV BOLUS
500.0000 mL | Freq: Once | INTRAVENOUS | Status: AC
  Administered 2024-03-05: 500 mL via INTRAVENOUS

## 2024-03-05 MED ORDER — SODIUM CHLORIDE 0.9 % IV SOLN: 1.0000 g | Freq: Once | INTRAVENOUS | Status: DC

## 2024-03-05 MED ORDER — DULOXETINE HCL 30 MG PO CPEP
30.0000 mg | ORAL_CAPSULE | Freq: Two times a day (BID) | ORAL | Status: DC
  Administered 2024-03-05 – 2024-03-06 (×2): 30 mg via ORAL
  Filled 2024-03-05 (×2): qty 1

## 2024-03-05 MED ORDER — BISACODYL 5 MG PO TBEC: 5.0000 mg | DELAYED_RELEASE_TABLET | Freq: Every day | ORAL | Status: DC | PRN

## 2024-03-05 MED ORDER — INSULIN ASPART 100 UNIT/ML IJ SOLN
0.0000 [IU] | Freq: Every day | INTRAMUSCULAR | Status: DC
Start: 2024-03-05 — End: 2024-03-06

## 2024-03-05 MED ORDER — INSULIN ASPART 100 UNIT/ML IJ SOLN
4.0000 [IU] | Freq: Three times a day (TID) | INTRAMUSCULAR | Status: DC
  Administered 2024-03-05: 4 [IU] via SUBCUTANEOUS

## 2024-03-05 MED ORDER — ASPIRIN 81 MG PO TBEC
81.0000 mg | DELAYED_RELEASE_TABLET | Freq: Every day | ORAL | Status: DC
  Administered 2024-03-06: 81 mg via ORAL
  Filled 2024-03-05: qty 1

## 2024-03-05 MED ORDER — DAPAGLIFLOZIN PROPANEDIOL 10 MG PO TABS
10.0000 mg | ORAL_TABLET | Freq: Every day | ORAL | Status: DC
  Administered 2024-03-06: 10 mg via ORAL
  Filled 2024-03-05: qty 1

## 2024-03-05 MED ORDER — ACETAMINOPHEN 650 MG RE SUPP: 650.0000 mg | Freq: Four times a day (QID) | RECTAL | Status: DC | PRN

## 2024-03-05 MED ORDER — ONDANSETRON HCL 4 MG/2ML IJ SOLN: 4.0000 mg | Freq: Four times a day (QID) | INTRAMUSCULAR | Status: DC | PRN

## 2024-03-05 MED ORDER — CARVEDILOL 3.125 MG PO TABS
3.1250 mg | ORAL_TABLET | Freq: Two times a day (BID) | ORAL | Status: DC
  Administered 2024-03-05 – 2024-03-06 (×2): 3.125 mg via ORAL
  Filled 2024-03-05 (×2): qty 1

## 2024-03-05 MED ORDER — ENOXAPARIN SODIUM 40 MG/0.4ML IJ SOSY
40.0000 mg | PREFILLED_SYRINGE | INTRAMUSCULAR | Status: DC
  Administered 2024-03-05: 40 mg via SUBCUTANEOUS
  Filled 2024-03-05: qty 0.4

## 2024-03-05 NOTE — ED Triage Notes (Signed)
 [facility]. Pt reports she called out for help because she was having sob. EMS reports pt was hypotensive and bradycardic. Pt has a hx of stroke and MI. Pt currently has slurred speech which is reported to be her baseline. Pt is rambling during triage. Notes from the jail show a HR in the 40's for 5+ minutes

## 2024-03-05 NOTE — Hospital Course (Signed)
 63 year old female past medical history of stroke with residual deficits including speech deficits and left hemiparesis, poorly controlled type 2 diabetes mellitus on insulin, chronic systolic heart failure, recurrent depression, GERD, hyperlipidemia, hypothyroidism, coronary artery disease status post NSTEMI, chronic thoracic back pain who reportedly had a syncopal episode in the correctional facility early this morning.  She said that she was sitting up on a stool she became diaphoretic and blacked out.  They found her to be unresponsive.  She was hypotensive with a blood pressure 80/42.  She reportedly had just started back on her home medications after being off of them for 5 days.  She has been in the correctional facility since 03/01/2024.  Fortunately her blood pressure improved after small fluid bolus.  They had difficult time keeping her awake and she would say "want to go to sleep" and sent to the emergency department.  This morning she reportedly had received Farxiga, metformin, Synthroid, lisinopril 40 mg, duloxetine 30 mg, carvedilol 25 mg.  Patient has denied chest pain.  She reported having some shortness of breath.  She denies cough and fever.  Her ED workup was positive for blood glucose of 221.  BUN 26 creatinine 1.04, BNP 114.0, troponin 63.  WBC 10.4, hemoglobin 12.2, platelet count 264.  D-dimer less than 0.27.  Respiratory panel negative for influenza RSV and coronavirus SARS 2.  Urinalysis positive for glycosuria, ketones, moderate leukocytes.  CT head with no acute findings but did note chronic appearing anterior left MCA vascular territory ischemia.  MRI brain negative for acute intracranial abnormality.  Observation admission requested for syncope work up.

## 2024-03-05 NOTE — H&P (Addendum)
 History and Physical  [facility]  Ashley Watts VHQ:[id number] DOB: 10-Jan-1961 DOA: 03/05/2024  PCP: Kirt Boys, PA-C  Patient coming from: correctional facility Level of care: Telemetry  I have personally briefly reviewed patient's old medical records in [facility]  Chief Complaint: passed out   HPI: Ashley Watts is a 63 year old female past medical history of stroke with residual deficits including speech deficits and left hemiparesis, poorly controlled type 2 diabetes mellitus on insulin, chronic systolic heart failure, recurrent depression, GERD, hyperlipidemia, hypothyroidism, coronary artery disease status post NSTEMI, chronic thoracic back pain who reportedly had a syncopal episode in the correctional facility early this morning.  She said that she was sitting up on a stool she became diaphoretic and blacked out.  They found her to be unresponsive.  She was hypotensive with a blood pressure 80/42.  She reportedly had just started back on her home medications after being off of them for 5 days.  She has been in the correctional facility since 03/01/2024.  Fortunately her blood pressure improved after small fluid bolus.  They had difficult time keeping her awake and she would say "want to go to sleep" and sent to the emergency department.  This morning she reportedly had received Farxiga, metformin, Synthroid, lisinopril 40 mg, duloxetine 30 mg, carvedilol 25 mg.  Patient has denied chest pain.  She reported having some shortness of breath.  She denies cough and fever.  Her ED workup was positive for blood glucose of 221.  BUN 26 creatinine 1.04, BNP 114.0, troponin 63.  WBC 10.4, hemoglobin 12.2, platelet count 264.  D-dimer less than 0.27.  Respiratory panel negative for influenza RSV and coronavirus SARS 2.  Urinalysis positive for glycosuria, ketones, moderate leukocytes.  CT head with no acute findings but did note chronic appearing anterior left MCA vascular  territory ischemia.  MRI brain negative for acute intracranial abnormality.  Observation admission requested for syncope work up.      Past Medical History:  Diagnosis Date   Anxiety    Arthritis    Chest pain 11/2012   CHF (congestive heart failure) (HCC)    Depression    Diabetes mellitus without complication (HCC)    Headache(784.0)    Hypertension    Hypothyroidism    Neuromuscular disorder (HCC)    DIABETIC NEUROPATHY   Shortness of breath     Past Surgical History:  Procedure Laterality Date   ABDOMINAL HYSTERECTOMY     LEFT AND RIGHT HEART CATHETERIZATION WITH CORONARY ANGIOGRAM N/A 11/20/2012   Procedure: LEFT AND RIGHT HEART CATHETERIZATION WITH CORONARY ANGIOGRAM;  Surgeon: Peter M Swaziland, MD;  Location: [facility] CATH LAB;  Service: Cardiovascular;  Laterality: N/A;     reports that she has never smoked. She has never used smokeless tobacco. She reports that she does not drink alcohol and does not use drugs.  Allergies  Allergen Reactions   Codeine Itching   Morphine And Codeine Itching   Hydrocodone Itching and Rash    Family History  Problem Relation Age of Onset   Heart attack Father 77    Prior to Admission medications   Medication Sig Start Date End Date Taking? Authorizing Provider  aspirin EC 81 MG EC tablet Take 1 tablet (81 mg total) by mouth daily. 09/18/17   Regalado, Prentiss Bells, MD  blood glucose meter kit and supplies Dispense based on patient and insurance preference. Use up to four times daily as directed. (FOR ICD-10 E10.9, E11.9). 12/18/18  Johna Sheriff, MD  Blood Glucose Monitoring Suppl (ONETOUCH VERIO) w/Device KIT 1 kit by Does not apply route 4 (four) times daily. 12/18/18   Johna Sheriff, MD  carvedilol (COREG) 25 MG tablet Take 25 mg by mouth 2 (two) times daily.    [provider]  clopidogrel (PLAVIX) 75 MG tablet Take 75 mg by mouth daily.    [provider]  dapagliflozin propanediol (FARXIGA) 10 MG TABS tablet Take  10 mg by mouth daily.    [provider]  DULoxetine (CYMBALTA) 30 MG capsule Take 1 capsule (30 mg total) by mouth daily. (Needs to be seen before next refill) Patient not taking: Reported on 05/03/2021 11/12/19   [name], [name]  DULoxetine (CYMBALTA) 60 MG capsule Take 60 mg by mouth daily. 02/08/21   [provider]  fluticasone (FLONASE) 50 MCG/ACT nasal spray Place 2 sprays into both nostrils daily. Patient taking differently: Place 2 sprays into both nostrils daily as needed for allergies. 01/08/19   [name], [name]  furosemide (LASIX) 40 MG tablet Take 1 tablet (40 mg total) by mouth daily. 05/10/19   [name], [name]  GLUCOPHAGE 1000 MG tablet Take 1 tablet (1,000 mg total) by mouth 2 (two) times daily with a meal. 05/10/19   [name], [name], [name]  glucose blood (ACCU-CHEK AVIVA) test strip Check glucose up to QID Dx E11.9 12/25/19   [name], [name]  HYDROcodone-acetaminophen (NORCO/VICODIN) 5-325 MG tablet Take 1 tablet by mouth 2 (two) times daily as needed for moderate pain. 04/16/21   [provider]  JANUVIA 100 MG tablet Take 100 mg by mouth daily. 04/01/21   [provider]  Lancets (ACCU-CHEK SOFT TOUCH) lancets Use as instructed 12/18/18   Johna Sheriff, MD  LANTUS SOLOSTAR 100 UNIT/ML Solostar Pen INJECT 50 UNITS AT BEDTIME Patient taking differently: Inject 50 Units into the skin at bedtime. 12/24/19   [name], [name]  levothyroxine (SYNTHROID) 125 MCG tablet Take 1 tablet (125 mcg total) by mouth daily. Patient not taking: Reported on 05/03/2021 05/12/19   [name], [name]  LINZESS 145 MCG CAPS capsule Take 145 mcg by mouth daily. 11/23/20   [provider]  lisinopril (ZESTRIL) 40 MG tablet Take 1 tablet (40 mg total) by mouth daily. 05/10/19   [name], [name]  naloxone [facility]) nasal spray 4 mg/0.1 mL Place 1 spray into the nose once.    [provider]  pantoprazole (PROTONIX) 40 MG tablet Take 1 tablet (40 mg  total) by mouth daily. 05/10/19   [name], [name]  simvastatin (ZOCOR) 10 MG tablet Take 1 tablet (10 mg total) by mouth daily at 6 PM. 05/10/19   [name], [name], [name]  SYNTHROID 112 MCG tablet Take 112 mcg by mouth daily. 03/10/21   [provider]  traMADol (ULTRAM) 50 MG tablet Take 50 mg by mouth every 6 (six) hours as needed for moderate pain.    [provider]   Physical Exam: Vitals:   03/05/24 1045 03/05/24 1130 03/05/24 1145 03/05/24 1200  BP: (!) 140/72 125/67 128/63 139/74  Pulse: 72 66 65 67  Resp: 17 18 17 17   SpO2: 100% 98% 98% 97%  Weight:      Height:       Constitutional: pronounced facial droop and speech deficits (baseline) NAD, calm, comfortable, awake, alert, mentation normal and answering questions with active participation. Emotion at times given current circumstances. Eyes:  PERRL, lids and conjunctivae normal ENMT: Mucous membranes are dry. Posterior pharynx clear of any exudate or lesions.  Poor dentition.  Neck: normal, supple, no masses, no thyromegaly Respiratory: clear to auscultation bilaterally, no wheezing, no crackles. Normal respiratory effort. No accessory muscle use.  Cardiovascular: normal s1, s2 sounds, no murmurs / rubs / gallops. No extremity edema. 2+ pedal pulses. No carotid bruits.  Abdomen: no tenderness, no masses palpated. No hepatosplenomegaly. Bowel sounds positive.  Musculoskeletal: no clubbing / cyanosis. No joint deformity upper and lower extremities. Good ROM, no contractures. Normal muscle tone.  Skin: no rashes, lesions, ulcers. No induration Neurologic: CN 2-12 grossly intact. Pronounced left hemiparesis. Speech deficits.  Psychiatric: Normal judgment and insight. Alert and oriented x 3.  Depressed mood.   Labs on Admission: I have personally reviewed following labs and imaging studies  CBC: Recent Labs  Lab 03/05/24 1127  WBC 10.4  NEUTROABS 7.8*  HGB 12.2  HCT 37.4  MCV 84.6  PLT 264   Basic Metabolic  Panel: Recent Labs  Lab 03/05/24 1127  NA 136  K 3.9  CL 105  CO2 20*  GLUCOSE 221*  BUN 26*  CREATININE 1.04*  CALCIUM 9.5  MG 1.7   GFR: Estimated Creatinine Clearance: 63 mL/min (A) (by C-G formula based on SCr of 1.04 mg/dL (H)). Liver Function Tests: Recent Labs  Lab 03/05/24 1127  AST 24  ALT 25  ALKPHOS 67  BILITOT 1.0  PROT 7.1  ALBUMIN 4.1   No results for input(s): "LIPASE", "AMYLASE" in the last 168 hours. No results for input(s): "AMMONIA" in the last 168 hours. Coagulation Profile: No results for input(s): "INR", "PROTIME" in the last 168 hours. Cardiac Enzymes: No results for input(s): "CKTOTAL", "CKMB", "CKMBINDEX", "TROPONINI" in the last 168 hours. BNP (last 3 results) No results for input(s): "PROBNP" in the last 8760 hours. HbA1C: No results for input(s): "HGBA1C" in the last 72 hours. CBG: No results for input(s): "GLUCAP" in the last 168 hours. Lipid Profile: No results for input(s): "CHOL", "HDL", "LDLCALC", "TRIG", "CHOLHDL", "LDLDIRECT" in the last 72 hours. Thyroid Function Tests: No results for input(s): "TSH", "T4TOTAL", "FREET4", "T3FREE", "THYROIDAB" in the last 72 hours. Anemia Panel: No results for input(s): "VITAMINB12", "FOLATE", "FERRITIN", "TIBC", "IRON", "RETICCTPCT" in the last 72 hours. Urine analysis:    Component Value Date/Time   COLORURINE YELLOW 03/05/2024 1345   APPEARANCEUR HAZY (A) 03/05/2024 1345   APPEARANCEUR Clear 05/10/2019 1042   LABSPEC 1.028 03/05/2024 1345   PHURINE 6.0 03/05/2024 1345   GLUCOSEU >=500 (A) 03/05/2024 1345   HGBUR MODERATE (A) 03/05/2024 1345   BILIRUBINUR NEGATIVE 03/05/2024 1345   BILIRUBINUR Negative 05/10/2019 1042   KETONESUR 5 (A) 03/05/2024 1345   PROTEINUR NEGATIVE 03/05/2024 1345   UROBILINOGEN 1.0 06/19/2013 1214   NITRITE NEGATIVE 03/05/2024 1345   LEUKOCYTESUR MODERATE (A) 03/05/2024 1345    Radiological Exams on Admission: MR Brain Wo Contrast (neuro protocol) Result  Date: 03/05/2024 CLINICAL DATA:  Provided history: Syncope/presyncope, cerebrovascular cause suspected. EXAM: MRI HEAD WITHOUT CONTRAST TECHNIQUE: Multiplanar, multiecho pulse sequences of the brain and surrounding structures were obtained without intravenous contrast. COMPARISON:  Head CT 03/05/2024.  Brain MRI 06/19/2013. FINDINGS: Brain: Chronic MCA territory cortical/subcortical infarcts within the left frontal and left parietal lobes, new from the prior brain MRI of 06/19/2013. Chronic lacunar infarct again demonstrated within the left corona radiata/basal ganglia. Mild multifocal T2 FLAIR hyperintense signal abnormality elsewhere within the cerebral white matter and pons, nonspecific but compatible with chronic  small vessel ischemic disease. There is no acute infarct. No evidence of an intracranial mass. No chronic intracranial blood products. No extra-axial fluid collection. No midline shift. Vascular: Maintained flow voids within the proximal large arterial vessels. Skull and upper cervical spine: No focal worrisome marrow lesion. Sinuses/Orbits: No mass or acute finding within the imaged orbits. Prior bilateral ocular lens replacement. No significant paranasal sinus disease. IMPRESSION: 1.  No evidence of an acute intracranial abnormality. 2. Chronic cortical/subcortical MCA territory infarcts within the left frontal and left parietal lobes, new from the prior brain MRI of 06/19/2013. 3. Unchanged chronic lacunar infarct within the left corona radiata/basal ganglia. 4. Background mild chronic small ischemic changes within the cerebral white matter and pons, progressed. Electronically Signed   By: Jackey Loge D.O.   On: 03/05/2024 13:01   DG Chest 1 View Result Date: 03/05/2024 CLINICAL DATA:  Weakness.  Shortness of breath. EXAM: CHEST  1 VIEW COMPARISON:  None Available. FINDINGS: Low lung volumes. The heart size and mediastinal contours are within normal limits. Mild bilateral interstitial prominence,  which could reflect bronchovascular crowding secondary to hypoinflation, edema, or atypical infection. Small relatively homogeneous radiodensities projecting over the right neck, may be external to the patient. No acute osseous abnormality. IMPRESSION: Mild bilateral interstitial prominence, could reflect bronchovascular crowding secondary to hypoinflation, edema, or atypical infection. Electronically Signed   By: Hart Robinsons M.D.   On: 03/05/2024 12:38   CT Head Wo Contrast Result Date: 03/05/2024 CLINICAL DATA:  63 year old female with shortness of breath, hypertensive, bradycardia, altered mental status. EXAM: CT HEAD WITHOUT CONTRAST TECHNIQUE: Contiguous axial images were obtained from the base of the skull through the vertex without intravenous contrast. RADIATION DOSE REDUCTION: This exam was performed according to the departmental dose-optimization program which includes automated exposure control, adjustment of the mA and/or kV according to patient size and/or use of iterative reconstruction technique. COMPARISON:  Head CT 05/02/2021. FINDINGS: Brain: Chronic lacunar infarct anterior left corona radiata and basal ganglia. Overlying cortically based infarct with encephalomalacia at the anterior, middle left MCA vascular territory is new since 20/2, but appears to be chronic with ex vacuo ventricular enlargement there. Mild contralateral right frontal lobe white matter hypodensity has not significantly changed. No midline shift, ventriculomegaly, mass effect, evidence of mass lesion, intracranial hemorrhage or evidence of cortically based acute infarction. Gray-white matter differentiation is within normal limits throughout the brain. *CRASH * that Vascular: Calcified atherosclerosis at the skull base. No suspicious intracranial vascular hyperdensity. Skull: Intact.  No acute osseous abnormality identified. Sinuses/Orbits: Visualized paranasal sinuses and mastoids are stable and well aerated. Other: No  acute orbit or scalp soft tissue finding. IMPRESSION: 1. Progressed since 2022 but chronic appearing anterior left MCA vascular territory ischemia. 2. No acute intracranial abnormality. Electronically Signed   By: Odessa Fleming M.D.   On: 03/05/2024 11:34   EKG: Independently reviewed. NSR no acute ST-T wave abnormalities   Assessment/Plan Principal Problem:   Syncope and collapse Active Problems:   Depression, recurrent (HCC)   Diabetes (HCC)   Chronic systolic heart failure (HCC)   Hypertension associated with diabetes (HCC)   Hypothyroid   Gastroesophageal reflux disease without esophagitis   Hyperlipidemia associated with type 2 diabetes mellitus (HCC)   History of stroke with current residual effects   UTI (urinary tract infection)   Hyperglycemia   Dehydration   AKI (acute kidney injury) (HCC)   Syncope and collapse - it appears patient became hypotensive after restarting medications after a 5  days holiday and likely from passed out from orthostatic hypotension.  She is clinically dehydrated and reports frequent urination from hyperglycemia and evidence of glycosuria on urinalysis.  - add urine drug screen as part of work up - check orthostatic vitals on admission - check TSH - continuous cardiac monitoring - neuro checks - IV fluids for rehydration - check carotid dopplers  - check 2D echocardiogram to evaluate heart structure and function - obtain PT/OT eval - suspect she has autonomic dysfunction from poorly controlled diabetes mellitus  Abnormal urinalysis - not convinced this is UTI - appears to be a contaminated specimen - she does have dysuria symptoms but likely from glycosuria due to empagliflozin - she was treated in ED with a dose of fosfomycin - urine culture pending  Uncontrolled type 2 diabetes mellitus with multiple complications - check A1c - lipid panel in AM - follow up TSH pending - SSI coverage ordered with frequent CBG monitoring - empagliflozin can  be resumed - awaiting for pharmacy to reconcile home meds for insulin doses etc  Depression  - resume home duloxetine 30 mg BID  Essential hypertension  - slowly can add back home BP meds as she is rehydrated  Hypothyroidism - resume home levothyroxine - TSH pending  GERD - pantoprazole ordered for GI protection  Chronic HFrEF - follow up 2D echocardiogram   AKI - prerenal given dehydration - hydrating with IV fluid  - follow BMP   Diabetic dyslipidemia - fasting lipid panel pending - resume home statin therapy when home meds reconciled    DVT prophylaxis: enoxaparin   Code Status: Full   Family Communication: pt declined offer for me to call and update  Disposition Plan:  release to corrections when medically cleared  Consults called:  PT/OT Admission status: OBV  Time spent: 62 mins   Level of care: Telemetry Standley Dakins MD [facility] How to contact the [facility] Attending or Consulting provider 7A - 7P or covering provider during after hours 7P -7A, for this patient?  Check the care team in [facility] and look for a) attending/consulting [facility] provider listed and b) the [facility] team listed Log into [email or web address] and use [place]'s universal password to access. If you do not have the password, please contact the hospital operator. Locate the [facility] provider you are looking for under [facility] and page to a number that you can be directly reached. If you still have difficulty reaching the provider, please page the [facility] (Director on Call) for the Hospitalists listed on amion for assistance.   If 7PM-7AM, please contact night-coverage [email or web address] Password [facility]  03/05/2024, 3:27 PM

## 2024-03-05 NOTE — Progress Notes (Signed)
 03/05/2024 6:27 PM  Messaged to the pharm D that home meds still need to be reconciled for admission orders.   Maryln Manuel MD

## 2024-03-05 NOTE — ED Provider Notes (Signed)
 [place] [facility]MENT AT [facility] Provider Note   CSN: [id number] Arrival date & time: 03/05/24  1021     History  Chief Complaint  Patient presents with   Near Syncope    Ashley Watts is a 63 y.o. female.  Is a 63 year old female with a past medical history of previous CVA who [facility] secondary to a near syncopal event.  It is noted that the patient called out as she was having some shortness of breath and was found to be lying on the ground and minimally responsive.  Patient was noted to be more confused by the staff at the jail.  Patient notes that she has no associated headache, pain to neck or back, chest pain at this time.  She denies any numbness or paresthesias.   Near Syncope       Home Medications Prior to Admission medications   Medication Sig Start Date End Date Taking? Authorizing Provider  aspirin EC 81 MG EC tablet Take 1 tablet (81 mg total) by mouth daily. 09/18/17   Regalado, Prentiss Bells, MD  blood glucose meter kit and supplies Dispense based on patient and insurance preference. Use up to four times daily as directed. (FOR ICD-10 E10.9, E11.9). 12/18/18   Johna Sheriff, MD  Blood Glucose Monitoring Suppl (ONETOUCH VERIO) w/Device KIT 1 kit by Does not apply route 4 (four) times daily. 12/18/18   Johna Sheriff, MD  carvedilol (COREG) 12.5 MG tablet Take 1 tablet (12.5 mg total) by mouth 2 (two) times daily with a meal. 05/10/19   [name], [name], [name]  DULoxetine (CYMBALTA) 30 MG capsule Take 1 capsule (30 mg total) by mouth daily. (Needs to be seen before next refill) Patient not taking: Reported on 05/03/2021 11/12/19   [name], [name]  DULoxetine (CYMBALTA) 60 MG capsule Take 60 mg by mouth daily. 02/08/21   [provider]  fluticasone (FLONASE) 50 MCG/ACT nasal spray Place 2 sprays into both nostrils daily. Patient taking differently: Place 2 sprays into both nostrils daily as  needed for allergies. 01/08/19   [name], [name]  furosemide (LASIX) 40 MG tablet Take 1 tablet (40 mg total) by mouth daily. 05/10/19   [name], [name]  GLUCOPHAGE 1000 MG tablet Take 1 tablet (1,000 mg total) by mouth 2 (two) times daily with a meal. 05/10/19   [name], [name], [name]  glucose blood (ACCU-CHEK AVIVA) test strip Check glucose up to QID Dx E11.9 12/25/19   [name], [name]  HYDROcodone-acetaminophen (NORCO/VICODIN) 5-325 MG tablet Take 1 tablet by mouth 2 (two) times daily as needed for moderate pain. 04/16/21   [provider]  JANUVIA 100 MG tablet Take 100 mg by mouth daily. 04/01/21   [provider]  Lancets (ACCU-CHEK SOFT TOUCH) lancets Use as instructed 12/18/18   Johna Sheriff, MD  LANTUS SOLOSTAR 100 UNIT/ML Solostar Pen INJECT 50 UNITS AT BEDTIME Patient taking differently: Inject 50 Units into the skin at bedtime. 12/24/19   [name], [name]  levothyroxine (SYNTHROID) 125 MCG tablet Take 1 tablet (125 mcg total) by mouth daily. Patient not taking: Reported on 05/03/2021 05/12/19   [name], [name]  LINZESS 145 MCG CAPS capsule Take 145 mcg by mouth daily. 11/23/20   [provider]  lisinopril (ZESTRIL) 40 MG tablet Take 1 tablet (40 mg total) by mouth daily. 05/10/19   [name], [name]  pantoprazole (PROTONIX) 40 MG tablet Take 1 tablet (40 mg total) by mouth daily. 05/10/19   [name], [name]  simvastatin (ZOCOR) 10 MG tablet Take 1 tablet (10 mg total) by mouth daily at 6 PM. 05/10/19   [name], [name], [name]  SYNTHROID 112 MCG tablet Take 112 mcg by mouth daily. 03/10/21   [provider]  traMADol (ULTRAM) 50 MG tablet Take 50 mg by mouth every 6 (six) hours as needed for moderate pain.    [provider]      Allergies    Codeine, Morphine and codeine, and Hydrocodone    Review of Systems   Review of Systems  Cardiovascular:  Positive for near-syncope.  Neurological:        Near syncope, confusion  All other  systems reviewed and are negative.   Physical Exam Updated Vital Signs LMP 05/02/2021  Physical Exam Vitals and nursing note reviewed.  Constitutional:      Appearance: Normal appearance.  HENT:     Head: Normocephalic and atraumatic.     Nose: Nose normal.     Mouth/Throat:     Mouth: Mucous membranes are moist.  Eyes:     Extraocular Movements: Extraocular movements intact.     Conjunctiva/sclera: Conjunctivae normal.     Pupils: Pupils are equal, round, and reactive to light.  Cardiovascular:     Rate and Rhythm: Normal rate and regular rhythm.     Pulses: Normal pulses.     Heart sounds: Normal heart sounds. No murmur heard.    No gallop.  Pulmonary:     Effort: Pulmonary effort is normal. No respiratory distress.     Breath sounds: Normal breath sounds. No stridor. No wheezing, rhonchi or rales.  Abdominal:     General: Abdomen is flat. Bowel sounds are normal. There is no distension.     Palpations: Abdomen is soft.     Tenderness: There is no abdominal tenderness. There is no guarding.  Musculoskeletal:        General: Normal range of motion.     Cervical back: Normal range of motion and neck supple.     Right lower leg: No edema.     Left lower leg: No edema.  Skin:    General: Skin is warm and dry.     Findings: No rash.  Neurological:     General: No focal deficit present.     Mental Status: She is alert and oriented to person, place, and time. Mental status is at baseline.     Cranial Nerves: No cranial nerve deficit.     Sensory: No sensory deficit.     Motor: No weakness.     Coordination: Coordination normal.  Psychiatric:        Mood and Affect: Mood normal.        Behavior: Behavior normal.        Thought Content: Thought content normal.        Judgment: Judgment normal.     ED Results / Procedures / Treatments   Labs (all labs ordered are listed, but only abnormal results are displayed) Labs Reviewed - No data to  display  EKG None  Radiology No results found.  Procedures Procedures    Medications Ordered in ED Medications - No data to display  ED Course/ Medical Decision Making/ A&P  Medical Decision Making Amount and/or Complexity of Data Reviewed Labs: ordered. Radiology: ordered.  Risk Decision regarding hospitalization.   This patient presents to the ED for concern of syncope, this involves an extensive number of treatment options, and is a complaint that carries with it a high risk of complications and morbidity.  The differential diagnosis includes CVA, TIA, sepsis, pneumonia, urinary tract infection, electrolyte derangement, acute kidney injury   Co morbidities that complicate the patient evaluation  Previous CVA   Additional history obtained:  Additional history obtained from medical records, staff from correctional facility External records from outside source obtained and reviewed including none   Lab Tests:  I Ordered, and personally interpreted labs.  The pertinent results include: Urinalysis with moderate leukocytes, elevated BNP, elevated troponin, creatinine at baseline, elevated glucose, negative D-dimer, normal magnesium, no leukocytosis, no anemia   Imaging Studies ordered:  I ordered imaging studies including chest x-ray, head CT, MRI brain I independently visualized and interpreted imaging which showed no acute cardiopulmonary process, no acute intracranial hemorrhage, no acute ischemic changes within the brain I agree with the radiologist interpretation   Cardiac Monitoring: / EKG:  The patient was maintained on a cardiac monitor.  I personally viewed and interpreted the cardiac monitored which showed an underlying rhythm of: Normal sinus rhythm, nonspecific T wave change, no ST changes, no STEMI   Consultations Obtained:  I requested consultation with the hospitalist,  and discussed lab and imaging findings as  well as pertinent plan - they recommend: Admission  Problem List / ED Course / Critical interventions / Medication management  Patient does remain stable at this time.  Discussed with patient that we will plan for admission to the hospital service for further workup.  The exact underlying cause of her syncopal event is unclear at this time.  Have started treatment for urinary tract infection at this point.  MRI of the brain demonstrated no indication for CVA or TIA.  She had no signs of acute intracranial hemorrhage.  She has no indication for pneumonia.  Have discussed patient case with Dr. Laural Benes with the hospitalist service who has excepted at this time. I ordered medication including IV fluids, Rocephin for urinary tract infection, syncope Reevaluation of the patient after these medicines showed that the patient improved I have reviewed the patients home medicines and have made adjustments as needed   Social Determinants of Health:  None   Test / Admission - Considered:  Admission        Final Clinical Impression(s) / ED Diagnoses Final diagnoses:  None    Rx / DC Orders ED Discharge Orders     None         Kathlen Mody 03/05/24 1509    Gloris Manchester, MD 03/05/24 1732

## 2024-03-05 NOTE — ED Notes (Signed)
 ED TO INPATIENT HANDOFF REPORT  ED Nurse Name and Phone #:   S Name/Age/Gender Ashley Watts 63 y.o. female Room/Bed: APA05/APA05  Code Status   Code Status: Prior  Home/SNF/Other jail Patient oriented to: self and place Is this baseline?  Jail does not know baseline  Triage Complete: Triage complete  Chief Complaint Syncope and collapse [R55]  Triage Note Pt bpb rcems and jail attendant. Pt reports she called out for help because she was having sob. EMS reports pt was hypotensive and bradycardic. Pt has a hx of stroke and MI. Pt currently has slurred speech which is reported to be her baseline. Pt is rambling during triage. Notes from the jail show a HR in the 40's for 5+ minutes   Allergies Allergies  Allergen Reactions   Codeine Itching   Morphine And Codeine Itching   Hydrocodone Itching and Rash    Level of Care/Admitting Diagnosis ED Disposition     ED Disposition  Admit   Condition  --   Comment  Hospital Area: [facility] [[id number]]  Level of Care: Telemetry [5]  Covid Evaluation: Asymptomatic - no recent exposure (last 10 days) testing not required  Diagnosis: Syncope and collapse [780.2.ICD-9-CM]  Admitting Physician: Cleora Fleet [[id number]]  Attending Physician: Cleora Fleet [[id number]]          B Medical/Surgery History Past Medical History:  Diagnosis Date   Anxiety    Arthritis    Chest pain 11/2012   CHF (congestive heart failure) (HCC)    Depression    Diabetes mellitus without complication (HCC)    Headache(784.0)    Hypertension    Hypothyroidism    Neuromuscular disorder (HCC)    DIABETIC NEUROPATHY   Shortness of breath    Past Surgical History:  Procedure Laterality Date   ABDOMINAL HYSTERECTOMY     LEFT AND RIGHT HEART CATHETERIZATION WITH CORONARY ANGIOGRAM N/A 11/20/2012   Procedure: LEFT AND RIGHT HEART CATHETERIZATION WITH CORONARY ANGIOGRAM;  Surgeon: Peter M Swaziland, MD;  Location: [facility] CATH LAB;   Service: Cardiovascular;  Laterality: N/A;     A IV Location/Drains/Wounds Patient Lines/Drains/Airways Status     Active Line/Drains/Airways     Name Placement date Placement time Site Days   Peripheral IV 03/05/24 20 G Left;Anterior Forearm 03/05/24  1045  Forearm  less than 1            Intake/Output Last 24 hours No intake or output data in the 24 hours ending 03/05/24 1435  Labs/Imaging Results for orders placed or performed during the hospital encounter of 03/05/24 (from the past 48 hours)  Resp panel by RT-PCR (RSV, Flu A&B, Covid) Anterior Nasal Swab     Status: None   Collection Time: 03/05/24 11:15 AM   Specimen: Anterior Nasal Swab  Result Value Ref Range   SARS Coronavirus 2 by RT PCR NEGATIVE NEGATIVE    Comment: (NOTE) SARS-CoV-2 target nucleic acids are NOT DETECTED.  The SARS-CoV-2 RNA is generally detectable in upper respiratory specimens during the acute phase of infection. The lowest concentration of SARS-CoV-2 viral copies this assay can detect is 138 copies/mL. A negative result does not preclude SARS-Cov-2 infection and should not be used as the sole basis for treatment or other patient management decisions. A negative result may occur with  improper specimen collection/handling, submission of specimen other than nasopharyngeal swab, presence of viral mutation(s) within the areas targeted by this assay, and inadequate number of viral copies(<138 copies/mL). A negative result  must be combined with clinical observations, patient history, and epidemiological information. The expected result is Negative.  Fact Sheet for Patients:  BloggerCourse.com  Fact Sheet for Healthcare Providers:  SeriousBroker.it  This test is no t yet approved or cleared by the [place] FDA and  has been authorized for detection and/or diagnosis of SARS-CoV-2 by FDA under an Emergency Use Authorization (EUA). This EUA  will remain  in effect (meaning this test can be used) for the duration of the COVID-19 declaration under Section 564(b)(1) of the Act, 21 U.S.C.section 360bbb-3(b)(1), unless the authorization is terminated  or revoked sooner.       Influenza A by PCR NEGATIVE NEGATIVE   Influenza B by PCR NEGATIVE NEGATIVE    Comment: (NOTE) The Xpert Xpress SARS-CoV-2/FLU/RSV plus assay is intended as an aid in the diagnosis of influenza from Nasopharyngeal swab specimens and should not be used as a sole basis for treatment. Nasal washings and aspirates are unacceptable for Xpert Xpress SARS-CoV-2/FLU/RSV testing.  Fact Sheet for Patients: BloggerCourse.com  Fact Sheet for Healthcare Providers: SeriousBroker.it  This test is not yet approved or cleared by the [place] FDA and has been authorized for detection and/or diagnosis of SARS-CoV-2 by FDA under an Emergency Use Authorization (EUA). This EUA will remain in effect (meaning this test can be used) for the duration of the COVID-19 declaration under Section 564(b)(1) of the Act, 21 U.S.C. section 360bbb-3(b)(1), unless the authorization is terminated or revoked.     Resp Syncytial Virus by PCR NEGATIVE NEGATIVE    Comment: (NOTE) Fact Sheet for Patients: BloggerCourse.com  Fact Sheet for Healthcare Providers: SeriousBroker.it  This test is not yet approved or cleared by the [place] FDA and has been authorized for detection and/or diagnosis of SARS-CoV-2 by FDA under an Emergency Use Authorization (EUA). This EUA will remain in effect (meaning this test can be used) for the duration of the COVID-19 declaration under Section 564(b)(1) of the Act, 21 U.S.C. section 360bbb-3(b)(1), unless the authorization is terminated or revoked.  Performed at [facility], [address] [place], [place] [address]   Comprehensive metabolic  panel     Status: Abnormal   Collection Time: 03/05/24 11:27 AM  Result Value Ref Range   Sodium 136 135 - 145 mmol/L   Potassium 3.9 3.5 - 5.1 mmol/L   Chloride 105 98 - 111 mmol/L   CO2 20 (L) 22 - 32 mmol/L   Glucose, Bld 221 (H) 70 - 99 mg/dL    Comment: Glucose reference range applies only to samples taken after fasting for at least 8 hours.   BUN 26 (H) 6 - 20 mg/dL   Creatinine, Ser 6.04 (H) 0.44 - 1.00 mg/dL   Calcium 9.5 8.9 - 54.0 mg/dL   Total Protein 7.1 6.5 - 8.1 g/dL   Albumin 4.1 3.5 - 5.0 g/dL   AST 24 15 - 41 U/L   ALT 25 0 - 44 U/L   Alkaline Phosphatase 67 38 - 126 U/L   Total Bilirubin 1.0 0.0 - 1.2 mg/dL   GFR, Estimated >98 >11 mL/min    Comment: (NOTE) Calculated using the CKD-EPI Creatinine Equation (2021)    Anion gap 11 5 - 15    Comment: Performed at [facility], [address], [place], [place] [address]  Troponin I (High Sensitivity)     Status: Abnormal   Collection Time: 03/05/24 11:27 AM  Result Value Ref Range   Troponin I (High Sensitivity) 63 (H) <18 ng/L  Comment: (NOTE) Elevated high sensitivity troponin I (hsTnI) values and significant  changes across serial measurements may suggest ACS but many other  chronic and acute conditions are known to elevate hsTnI results.  Refer to the "Links" section for chest pain algorithms and additional  guidance. Performed at [facility], [address] [place], [place] [address]   CBC with Differential     Status: Abnormal   Collection Time: 03/05/24 11:27 AM  Result Value Ref Range   WBC 10.4 4.0 - 10.5 K/uL   RBC 4.42 3.87 - 5.11 MIL/uL   Hemoglobin 12.2 12.0 - 15.0 g/dL   HCT 62.9 52.8 - 41.3 %   MCV 84.6 80.0 - 100.0 fL   MCH 27.6 26.0 - 34.0 pg   MCHC 32.6 30.0 - 36.0 g/dL   RDW 24.4 01.0 - 27.2 %   Platelets 264 150 - 400 K/uL   nRBC 0.0 0.0 - 0.2 %   Neutrophils Relative % 74 %   Neutro Abs 7.8 (H) 1.7 - 7.7 K/uL   Lymphocytes Relative 17 %   Lymphs Abs 1.8 0.7 - 4.0 K/uL    Monocytes Relative 6 %   Monocytes Absolute 0.6 0.1 - 1.0 K/uL   Eosinophils Relative 2 %   Eosinophils Absolute 0.2 0.0 - 0.5 K/uL   Basophils Relative 1 %   Basophils Absolute 0.1 0.0 - 0.1 K/uL   Immature Granulocytes 0 %   Abs Immature Granulocytes 0.02 0.00 - 0.07 K/uL    Comment: Performed at [facility], [address] [place], [place] [address]  D-dimer, quantitative     Status: None   Collection Time: 03/05/24 11:27 AM  Result Value Ref Range   D-Dimer, Quant <0.27 0.00 - 0.50 ug/mL-FEU    Comment: (NOTE) At the manufacturer cut-off value of 0.5 g/mL FEU, this assay has a negative predictive value of 95-100%.This assay is intended for use in conjunction with a clinical pretest probability (PTP) assessment model to exclude pulmonary embolism (PE) and deep venous thrombosis (DVT) in outpatients suspected of PE or DVT. Results should be correlated with clinical presentation. Performed at [facility], [address] [place], [place] [address]   Magnesium     Status: None   Collection Time: 03/05/24 11:27 AM  Result Value Ref Range   Magnesium 1.7 1.7 - 2.4 mg/dL    Comment: Performed at [facility], [address] [place], [place] [address]  Brain natriuretic peptide     Status: Abnormal   Collection Time: 03/05/24 11:27 AM  Result Value Ref Range   B Natriuretic Peptide 114.0 (H) 0.0 - 100.0 pg/mL    Comment: Performed at [facility], [address], [place], [place] [address]  Troponin I (High Sensitivity)     Status: Abnormal   Collection Time: 03/05/24  1:09 PM  Result Value Ref Range   Troponin I (High Sensitivity) 53 (H) <18 ng/L    Comment: (NOTE) Elevated high sensitivity troponin I (hsTnI) values and significant  changes across serial measurements may suggest ACS but many other  chronic and acute conditions are known to elevate hsTnI results.  Refer to the "Links" section for chest pain algorithms and additional  guidance. Performed at [facility],  [address] [place], [place] [address]   Urinalysis, Routine w reflex microscopic -Urine, Clean Catch     Status: Abnormal   Collection Time: 03/05/24  1:45 PM  Result Value Ref Range   Color, Urine YELLOW YELLOW   APPearance HAZY (  A) CLEAR   Specific Gravity, Urine 1.028 1.005 - 1.030   pH 6.0 5.0 - 8.0   Glucose, UA >=500 (A) NEGATIVE mg/dL   Hgb urine dipstick MODERATE (A) NEGATIVE   Bilirubin Urine NEGATIVE NEGATIVE   Ketones, ur 5 (A) NEGATIVE mg/dL   Protein, ur NEGATIVE NEGATIVE mg/dL   Nitrite NEGATIVE NEGATIVE   Leukocytes,Ua MODERATE (A) NEGATIVE   RBC / HPF 6-10 0 - 5 RBC/hpf   WBC, UA 21-50 0 - 5 WBC/hpf   Bacteria, UA NONE SEEN NONE SEEN   Squamous Epithelial / HPF 6-10 0 - 5 /HPF    Comment: Performed at [facility], [address] [place], [place] [address]   MR Brain Wo Contrast (neuro protocol) Result Date: 03/05/2024 CLINICAL DATA:  Provided history: Syncope/presyncope, cerebrovascular cause suspected. EXAM: MRI HEAD WITHOUT CONTRAST TECHNIQUE: Multiplanar, multiecho pulse sequences of the brain and surrounding structures were obtained without intravenous contrast. COMPARISON:  Head CT 03/05/2024.  Brain MRI 06/19/2013. FINDINGS: Brain: Chronic MCA territory cortical/subcortical infarcts within the left frontal and left parietal lobes, new from the prior brain MRI of 06/19/2013. Chronic lacunar infarct again demonstrated within the left corona radiata/basal ganglia. Mild multifocal T2 FLAIR hyperintense signal abnormality elsewhere within the cerebral white matter and pons, nonspecific but compatible with chronic small vessel ischemic disease. There is no acute infarct. No evidence of an intracranial mass. No chronic intracranial blood products. No extra-axial fluid collection. No midline shift. Vascular: Maintained flow voids within the proximal large arterial vessels. Skull and upper cervical spine: No focal worrisome marrow lesion. Sinuses/Orbits: No mass or acute finding  within the imaged orbits. Prior bilateral ocular lens replacement. No significant paranasal sinus disease. IMPRESSION: 1.  No evidence of an acute intracranial abnormality. 2. Chronic cortical/subcortical MCA territory infarcts within the left frontal and left parietal lobes, new from the prior brain MRI of 06/19/2013. 3. Unchanged chronic lacunar infarct within the left corona radiata/basal ganglia. 4. Background mild chronic small ischemic changes within the cerebral white matter and pons, progressed. Electronically Signed   By: Jackey Loge D.O.   On: 03/05/2024 13:01   DG Chest 1 View Result Date: 03/05/2024 CLINICAL DATA:  Weakness.  Shortness of breath. EXAM: CHEST  1 VIEW COMPARISON:  None Available. FINDINGS: Low lung volumes. The heart size and mediastinal contours are within normal limits. Mild bilateral interstitial prominence, which could reflect bronchovascular crowding secondary to hypoinflation, edema, or atypical infection. Small relatively homogeneous radiodensities projecting over the right neck, may be external to the patient. No acute osseous abnormality. IMPRESSION: Mild bilateral interstitial prominence, could reflect bronchovascular crowding secondary to hypoinflation, edema, or atypical infection. Electronically Signed   By: Hart Robinsons M.D.   On: 03/05/2024 12:38   CT Head Wo Contrast Result Date: 03/05/2024 CLINICAL DATA:  63 year old female with shortness of breath, hypertensive, bradycardia, altered mental status. EXAM: CT HEAD WITHOUT CONTRAST TECHNIQUE: Contiguous axial images were obtained from the base of the skull through the vertex without intravenous contrast. RADIATION DOSE REDUCTION: This exam was performed according to the departmental dose-optimization program which includes automated exposure control, adjustment of the mA and/or kV according to patient size and/or use of iterative reconstruction technique. COMPARISON:  Head CT 05/02/2021. FINDINGS: Brain: Chronic  lacunar infarct anterior left corona radiata and basal ganglia. Overlying cortically based infarct with encephalomalacia at the anterior, middle left MCA vascular territory is new since 20/2, but appears to be chronic with ex vacuo ventricular enlargement there. Mild contralateral right frontal lobe  white matter hypodensity has not significantly changed. No midline shift, ventriculomegaly, mass effect, evidence of mass lesion, intracranial hemorrhage or evidence of cortically based acute infarction. Gray-white matter differentiation is within normal limits throughout the brain. *CRASH * that Vascular: Calcified atherosclerosis at the skull base. No suspicious intracranial vascular hyperdensity. Skull: Intact.  No acute osseous abnormality identified. Sinuses/Orbits: Visualized paranasal sinuses and mastoids are stable and well aerated. Other: No acute orbit or scalp soft tissue finding. IMPRESSION: 1. Progressed since 2022 but chronic appearing anterior left MCA vascular territory ischemia. 2. No acute intracranial abnormality. Electronically Signed   By: Odessa Fleming M.D.   On: 03/05/2024 11:34    Pending Labs Unresulted Labs (From admission, onward)    None       Vitals/Pain Today's Vitals   03/05/24 1045 03/05/24 1130 03/05/24 1145 03/05/24 1200  BP: (!) 140/72 125/67 128/63 139/74  Pulse: 72 66 65 67  Resp: 17 18 17 17   SpO2: 100% 98% 98% 97%  Weight:      Height:      PainSc:        Isolation Precautions No active isolations  Medications Medications  cefTRIAXone (ROCEPHIN) 1 g in sodium chloride 0.9 % 100 mL IVPB (has no administration in time range)  sodium chloride 0.9 % bolus 500 mL (500 mLs Intravenous Bolus from Bag 03/05/24 1116)    Mobility Pt brought in for syncope. Pt was not ambulated in the ED.      Focused Assessments    R Recommendations: See Admitting Provider Note  Report given to:   Additional Notes: pt in police custody

## 2024-03-05 NOTE — ED Notes (Signed)
 Safety socks and warm blankets given.

## 2024-03-06 ENCOUNTER — Observation Stay (HOSPITAL_COMMUNITY)

## 2024-03-06 ENCOUNTER — Other Ambulatory Visit (HOSPITAL_COMMUNITY): Payer: Self-pay | Admitting: *Deleted

## 2024-03-06 ENCOUNTER — Observation Stay (HOSPITAL_BASED_OUTPATIENT_CLINIC_OR_DEPARTMENT_OTHER)

## 2024-03-06 DIAGNOSIS — R7989 Other specified abnormal findings of blood chemistry: Secondary | ICD-10-CM

## 2024-03-06 DIAGNOSIS — R55 Syncope and collapse: Secondary | ICD-10-CM

## 2024-03-06 DIAGNOSIS — E1169 Type 2 diabetes mellitus with other specified complication: Secondary | ICD-10-CM | POA: Diagnosis not present

## 2024-03-06 DIAGNOSIS — E785 Hyperlipidemia, unspecified: Secondary | ICD-10-CM

## 2024-03-06 LAB — ECHOCARDIOGRAM COMPLETE
AR max vel: 2.14 cm2
AV Area VTI: 2.19 cm2
AV Area mean vel: 2.14 cm2
AV Mean grad: 3.2 mmHg
AV Peak grad: 6.8 mmHg
Ao pk vel: 1.31 m/s
Area-P 1/2: 1.88 cm2
Height: 64 in
S' Lateral: 2.5 cm
Weight: 3132.3 [oz_av]

## 2024-03-06 LAB — BASIC METABOLIC PANEL WITH GFR
Anion gap: 9 (ref 5–15)
BUN: 23 mg/dL — ABNORMAL HIGH (ref 6–20)
CO2: 22 mmol/L (ref 22–32)
Calcium: 9.3 mg/dL (ref 8.9–10.3)
Chloride: 107 mmol/L (ref 98–111)
Creatinine, Ser: 0.99 mg/dL (ref 0.44–1.00)
GFR, Estimated: >60 mL/min (ref >60)
Glucose, Bld: 170 mg/dL — ABNORMAL HIGH (ref 70–99)
Potassium: 3.9 mmol/L (ref 3.5–5.1)
Sodium: 138 mmol/L (ref 135–145)

## 2024-03-06 LAB — HIV ANTIBODY (ROUTINE TESTING W REFLEX): HIV Screen 4th Generation wRfx: NONREACTIVE

## 2024-03-06 LAB — LIPID PANEL
Cholesterol: 162 mg/dL (ref 0–200)
HDL: 23 mg/dL — ABNORMAL LOW (ref >40)
LDL Cholesterol: 87 mg/dL (ref 0–99)
Total CHOL/HDL Ratio: 7 ratio
Triglycerides: 259 mg/dL — ABNORMAL HIGH (ref <150)
VLDL: 52 mg/dL — ABNORMAL HIGH (ref 0–40)

## 2024-03-06 LAB — GLUCOSE, CAPILLARY
Glucose-Capillary: 151 mg/dL — ABNORMAL HIGH (ref 70–99)
Glucose-Capillary: 187 mg/dL — ABNORMAL HIGH (ref 70–99)

## 2024-03-06 LAB — MAGNESIUM: Magnesium: 2.1 mg/dL (ref 1.7–2.4)

## 2024-03-06 MED ORDER — CARVEDILOL 6.25 MG PO TABS: 6.2500 mg | ORAL_TABLET | Freq: Two times a day (BID) | ORAL | Status: AC

## 2024-03-06 MED ORDER — LANTUS SOLOSTAR 100 UNIT/ML ~~LOC~~ SOPN: 20.0000 [IU] | PEN_INJECTOR | Freq: Every day | SUBCUTANEOUS | Status: AC

## 2024-03-06 MED ORDER — LISINOPRIL 5 MG PO TABS
5.0000 mg | ORAL_TABLET | Freq: Every day | ORAL | Status: DC
Start: 2024-03-06 — End: 2024-06-15

## 2024-03-06 MED ORDER — CARVEDILOL 3.125 MG PO TABS
6.2500 mg | ORAL_TABLET | Freq: Two times a day (BID) | ORAL | Status: DC
  Administered 2024-03-06: 6.25 mg via ORAL
  Filled 2024-03-06: qty 2

## 2024-03-06 MED ORDER — INSULIN GLARGINE-YFGN 100 UNIT/ML ~~LOC~~ SOLN
5.0000 [IU] | Freq: Every day | SUBCUTANEOUS | Status: DC
  Administered 2024-03-06: 5 [IU] via SUBCUTANEOUS
  Filled 2024-03-06 (×2): qty 0.05

## 2024-03-06 MED ORDER — CEFDINIR 300 MG PO CAPS: 300.0000 mg | ORAL_CAPSULE | Freq: Two times a day (BID) | ORAL | Status: DC

## 2024-03-06 NOTE — Progress Notes (Signed)
 Mobility Specialist Progress Note:    03/06/24 1040  Mobility  Activity Stood at bedside;Dangled on edge of bed  Level of Assistance Contact guard assist, steadying assist  Assistive Device None  Range of Motion/Exercises Active;All extremities  Activity Response Tolerated well  Mobility visit 1 Mobility  Mobility Specialist Start Time (ACUTE ONLY) 1020  Mobility Specialist Stop Time (ACUTE ONLY) 1040  Mobility Specialist Time Calculation (min) (ACUTE ONLY) 20 min   Pt received in bed, agreeable to mobility. Required CGA to stand with no AD. Tolerated well, see flowsheets for orthostatic vital signs per NT. Returned pt supine, alarm on. All needs met.  Lawerance Bach Mobility Specialist Please contact via Special educational needs teacher or  Rehab office at [phone]

## 2024-03-06 NOTE — Progress Notes (Signed)
   03/06/24 1252  TOC Brief Assessment  Insurance and Status Reviewed  Patient has primary care physician No (Inmate followed at the facility)  Home environment has been reviewed inmate  Prior level of function: Independent  Social Drivers of Health Review SDOH reviewed no interventions necessary  Readmission risk has been reviewed Yes  Transition of care needs no transition of care needs at this time   Transition of Care Department [facility]) has reviewed patient and no TOC needs have been identified at this time. We will continue to monitor patient advancement through interdisciplinary progression rounds. If new patient transition needs arise, please place a TOC consult.

## 2024-03-06 NOTE — Progress Notes (Signed)
*  PRELIMINARY RESULTS* Echocardiogram 2D Echocardiogram has been performed.  Stacey Drain 03/06/2024, 3:27 PM

## 2024-03-06 NOTE — Evaluation (Signed)
 Physical Therapy Brief Evaluation and Discharge Note Patient Details Name: Ashley Watts MRN: [id number] DOB: [birth year] Today's Date: 03/06/2024   History of Present Illness  a 63 year old female past medical history of stroke with residual deficits including speech deficits and left hemiparesis, poorly controlled type 2 diabetes mellitus on insulin, chronic systolic heart failure, recurrent depression, GERD, hyperlipidemia, hypothyroidism, coronary artery disease status post NSTEMI, chronic thoracic back pain who reportedly had a syncopal episode in the correctional facility early this morning.  She said that she was sitting up on a stool she became diaphoretic and blacked out.  They found her to be unresponsive.  She was hypotensive with a blood pressure 80/42.  She reportedly had just started back on her home medications after being off of them for 5 days.  She has been in the correctional facility since 03/01/2024.  Fortunately her blood pressure improved after small fluid bolus.  They had difficult time keeping her awake and she would say "want to go to sleep" and sent to the emergency department.  This morning she reportedly had received Farxiga, metformin, Synthroid, lisinopril 40 mg, duloxetine 30 mg, carvedilol 25 mg.  Patient has denied chest pain.  She reported having some shortness of breath.  She denies cough and fever.  Her ED workup was positive for blood glucose of 221.  BUN 26 creatinine 1.04, BNP 114.0, troponin 63.  WBC 10.4, hemoglobin 12.2, platelet count 264.  D-dimer less than 0.27.  Respiratory panel negative for influenza RSV and coronavirus SARS 2.  Urinalysis positive for glycosuria, ketones, moderate leukocytes.  CT head with no acute findings but did note chronic appearing anterior left MCA vascular territory ischemia.  MRI brain negative for acute intracranial abnormality.  Observation admission requested for syncope work up.  Clinical Impression  Patient is performing at  functional baseline, no safety concerns noted throughout evaluation. Pt's BP in sitting was 158/89, in standing was 154/86.  Patient is safe to DC home with appropriate services, DME, and any other needs determined by care team.  PT to sign off at this time. PT discharging pt to mobility technician and nursing staff for further mobility activities. Please reconsult acute PT services if functional changes occur.  Thank you.       PT Assessment    Assistance Needed at Discharge       Equipment Recommendations None recommended by PT  Recommendations for Other Services       Precautions/Restrictions Precautions Precautions: None Restrictions Weight Bearing Restrictions Per Provider Order: No        Mobility  Bed Mobility          Transfers Overall transfer level: Modified independent                 General transfer comment: slow, labored with BUE assist at EOB; BP sitting:158/89  standing: 154/86    Ambulation/Gait Ambulation/Gait assistance: Modified independent (Device/Increase time) Gait Distance (Feet): 60 Feet Assistive device: IV Pole Gait Pattern/deviations: WFL(Within Functional Limits)   General Gait Details: normal gait pattern with no LOB, slow gait pattern, utilizing IV pole for minor steadying.  Home Activity Instructions    Stairs            Modified Rankin (Stroke Patients Only)        Balance                          Pertinent Vitals/Pain   Pain Assessment Pain Assessment: No/denies  pain     Home Living               Additional Comments: First floor per officer on site    Prior Function        UE/LE Assessment               Communication         Cognition         General Comments      Exercises     Assessment/Plan    PT Problem List         PT Visit Diagnosis Unsteadiness on feet (R26.81)    No Skilled PT Patient is independent with all acitivity/mobility;Patient is modified  independent with all activity/mobility   Co-evaluation                AMPAC 6 Clicks Help needed turning from your back to your side while in a flat bed without using bedrails?: None Help needed moving from lying on your back to sitting on the side of a flat bed without using bedrails?: None Help needed moving to and from a bed to a chair (including a wheelchair)?: None Help needed standing up from a chair using your arms (e.g., wheelchair or bedside chair)?: None Help needed to walk in hospital room?: None Help needed climbing 3-5 steps with a railing? : None 6 Click Score: 24      End of Session Equipment Utilized During Treatment: Gait belt   Patient left: in chair;with family/visitor present Nurse Communication: Mobility status PT Visit Diagnosis: Unsteadiness on feet (R26.81)     Time: 1308-6578 PT Time Calculation (min) (ACUTE ONLY): 17 min  Charges:   PT Evaluation $PT Eval Low Complexity: 1 Low     Elie Goody, DPT [facility] [place] [address] [phone] office  Ashley Watts  03/06/2024, 1:57 PM

## 2024-03-06 NOTE — Discharge Summary (Addendum)
 Physician Discharge Summary   Patient: Ashley Watts MRN: [id number] DOB: March 11, 1961  Admit date:     03/05/2024  Discharge date: 03/06/24  Discharge Physician: Onalee Hua Michaline Kindig   PCP: Kirt Boys, PA-C   Recommendations at discharge:   Please follow up with primary care provider within 1-2 weeks  Please repeat BMP and CBC in one week     Hospital Course: 63 year old female past medical history of stroke with residual deficits including speech deficits and left hemiparesis, poorly controlled type 2 diabetes mellitus on insulin, chronic diastolic heart failure, recurrent depression, GERD, hyperlipidemia, hypothyroidism, coronary artery disease status post NSTEMI, chronic thoracic back pain who reportedly had a syncopal episode in the correctional facility in am 03/05/24  She said that she was sitting up on a stool she became diaphoretic and blacked out.  They found her to be unresponsive.  Patient states that she was feeling somewhat dizzy and lightheaded about an hour after she received her medications even before passing out.  She was hypotensive with a blood pressure 80/42.  She reportedly had just started back on her home medications after being off of them for 5 days.  She has been in the correctional facility since 03/01/2024.  Fortunately her blood pressure improved after small fluid bolus.  They had difficult time keeping her awake and she would say "want to go to sleep" and sent to the emergency department.  This morning she reportedly had received Farxiga, metformin, Synthroid, lisinopril 40 mg, duloxetine 30 mg, carvedilol 25 mg.  Patient has denied chest pain.  She reported having some shortness of breath.  She denies cough and fever.  Her ED workup was positive for blood glucose of 221.  BUN 26 creatinine 1.04, BNP 114.0, troponin 63.  WBC 10.4, hemoglobin 12.2, platelet count 264.  D-dimer less than 0.27.  Respiratory panel negative for influenza RSV and coronavirus SARS 2.   Urinalysis positive for glycosuria, ketones, moderate leukocytes.  CT head with no acute findings but did note chronic appearing anterior left MCA vascular territory ischemia.  MRI brain negative for acute intracranial abnormality.  Observation admission requested for syncope work up.    Assessment and Plan: Syncope -Secondary to hypotension and vasovagal phenomena -Patient was hypotensive after receiving all her cardiac medications and antihypertensive medications -Certainly, uncontrolled diabetes mellitus may contribute to dysautonomia -Personally reviewed telemetry-no concerning dysrhythmia -Carvedilol decreased to 6.25 mg twice daily -Restart lisinopril at lower dose 5 mg daily -Patient given IV fluids during hospitalization -03/06/24 Echo EF 60-65%, no WMA, G1DD, normal RVF, no AS -MRI brain negative for acute findings; chronic MCA territory infarct in the left frontal and left parietal lobes -CT brain negative for acute findings -D-dimer <0.27   -repeat orthostatics on 4/2 normal  Chronic HFpEF -05/16/2021 echo EF 55% -03/06/24 Echo EF 60-65%, no WMA, G1DD, normal RVF, no AS -Clinically euvolemic -Continue furosemide -Continue carvedilol at lower dose -Continue Farxiga  Uncontrolled diabetes mellitus type 2 with hyperglycemia -03/05/2024 hemoglobin A1c--pending at time of d/c -NovoLog sliding scale during hospitalization -Restart glargine  20 units at hs at discharge  Elevated troponin -no chest pain -due to demand ischemia -personally reviewed EKG--sinus, nonspecific TWI  Essential hypertension -Continue carvedilol and lisinopril  History of stroke -Continue aspirin  Mixed hyperlipidemia -LDL 87, HDL 23, triglycerides 259, TC 162 -Restart Lipitor  Hypothyroidism -Continue Synthroid  Pyuria -UA 21-30 WBC -cefdinir x 3 days  Obesity -BMI 33.60  Pyuria -UA 21-50 WBC -Fosfomycin x 1 given for 125  Depression -Continue duloxetine      Consultants:  none Procedures performed: none  Disposition:  Jail Diet recommendation:  Cardiac and Carb modified diet DISCHARGE MEDICATION: Allergies as of 03/06/2024       Reactions   Morphine And Codeine Itching   Codeine Itching   Hydrocodone Itching, Rash   Can tolerate Norco 5/325         Medication List     TAKE these medications    accu-chek soft touch lancets Use as instructed   aspirin EC 81 MG tablet Take 1 tablet (81 mg total) by mouth daily.   blood glucose meter kit and supplies Dispense based on patient and insurance preference. Use up to four times daily as directed. (FOR ICD-10 E10.9, E11.9).   carvedilol 6.25 MG tablet Commonly known as: COREG Take 1 tablet (6.25 mg total) by mouth 2 (two) times daily with a meal. What changed:  medication strength how much to take when to take this   clopidogrel 75 MG tablet Commonly known as: PLAVIX Take 75 mg by mouth daily.   dapagliflozin propanediol 10 MG Tabs tablet Commonly known as: FARXIGA Take 10 mg by mouth daily.   DULoxetine 30 MG capsule Commonly known as: CYMBALTA Take 1 capsule (30 mg total) by mouth daily. (Needs to be seen before next refill)   furosemide 40 MG tablet Commonly known as: LASIX Take 1 tablet (40 mg total) by mouth daily.   Glucophage 1000 MG tablet Generic drug: metFORMIN Take 1 tablet (1,000 mg total) by mouth 2 (two) times daily with a meal.   glucose blood test strip Commonly known as: Accu-Chek Aviva Check glucose up to QID Dx E11.9   Lantus SoloStar 100 UNIT/ML Solostar Pen Generic drug: insulin glargine Inject 20 Units into the skin at bedtime. What changed: See the new instructions.   Linzess 145 MCG Caps capsule Generic drug: linaclotide Take 145 mcg by mouth daily.   lisinopril 5 MG tablet Commonly known as: ZESTRIL Take 1 tablet (5 mg total) by mouth daily. What changed:  medication strength how much to take   OneTouch Verio w/Device Kit 1 kit by Does not  apply route 4 (four) times daily.   pantoprazole 40 MG tablet Commonly known as: PROTONIX Take 1 tablet (40 mg total) by mouth daily.   Synthroid 112 MCG tablet Generic drug: levothyroxine Take 112 mcg by mouth daily.        Discharge Exam: Filed Weights   03/05/24 1040 03/05/24 1637 03/06/24 0500  Weight: 88 kg 85.7 kg 88.8 kg   HEENT:  [place]/AT, No thrush, no icterus CV:  RRR, no rub, no S3, no S4 Lung:  CTA, no wheeze, no rhonchi Abd:  soft/+BS, NT Ext:  No edema, no lymphangitis, no synovitis, no rash   Condition at discharge: stable  The results of significant diagnostics from this hospitalization (including imaging, microbiology, ancillary and laboratory) are listed below for reference.   Imaging Studies: ECHOCARDIOGRAM COMPLETE Result Date: 03/06/2024    ECHOCARDIOGRAM REPORT   Patient Name:   KEELA RUBERT Date of Exam: 03/06/2024 Medical Rec #:  [id number]         Height:       64.0 in Accession #:    [id number]        Weight:       195.8 lb Date of Birth:  [birth year]        BSA:          1.939 m Patient Age:  62 years          BP:           122/81 mmHg Patient Gender: F                 HR:           66 bpm. [facility] Procedure: 2D Echo, Cardiac Doppler and Color Doppler (Both Spectral and Color            Flow Doppler were utilized during procedure). Indications:    Syncope R55  History:        Patient has prior history of Echocardiogram examinations, most                 recent 09/16/2017. CHF, CAD and Previous Myocardial Infarction;                 Risk Factors:Hypertension, Diabetes and Dyslipidemia.  Sonographer:    Celesta Gentile RCS Referring Phys: [phone] [facility] L J[facility] IMPRESSIONS  1. Left ventricular ejection fraction, by estimation, is 60 to 65%. The left ventricle has normal function. The left ventricle has no regional wall motion abnormalities. Left ventricular diastolic parameters are consistent with Grade I diastolic dysfunction (impaired  relaxation).  2. Right ventricular systolic function is normal. The right ventricular size is normal.  3. The mitral valve is normal in structure. Trivial mitral valve regurgitation. No evidence of mitral stenosis.  4. The aortic valve is tricuspid. Aortic valve regurgitation is not visualized. No aortic stenosis is present.  5. The inferior vena cava is normal in size with greater than 50% respiratory variability, suggesting right atrial pressure of 3 mmHg. FINDINGS  Left Ventricle: Left ventricular ejection fraction, by estimation, is 60 to 65%. The left ventricle has normal function. The left ventricle has no regional wall motion abnormalities. The left ventricular internal cavity size was normal in size. There is  no left ventricular hypertrophy. Left ventricular diastolic parameters are consistent with Grade I diastolic dysfunction (impaired relaxation). Normal left ventricular filling pressure. Right Ventricle: The right ventricular size is normal. Right vetricular wall thickness was not well visualized. Right ventricular systolic function is normal. Left Atrium: Left atrial size was normal in size. Right Atrium: Right atrial size was normal in size. Pericardium: There is no evidence of pericardial effusion. Mitral Valve: The mitral valve is normal in structure. Trivial mitral valve regurgitation. No evidence of mitral valve stenosis. Tricuspid Valve: The tricuspid valve is normal in structure. Tricuspid valve regurgitation is not demonstrated. No evidence of tricuspid stenosis. Aortic Valve: The aortic valve is tricuspid. Aortic valve regurgitation is not visualized. No aortic stenosis is present. Aortic valve mean gradient measures 3.2 mmHg. Aortic valve peak gradient measures 6.8 mmHg. Aortic valve area, by VTI measures 2.19 cm. Pulmonic Valve: The pulmonic valve was not well visualized. Pulmonic valve regurgitation is not visualized. No evidence of pulmonic stenosis. Aorta: The aortic root is normal in  size and structure and the ascending aorta was not well visualized. Venous: The inferior vena cava is normal in size with greater than 50% respiratory variability, suggesting right atrial pressure of 3 mmHg. IAS/Shunts: No atrial level shunt detected by color flow Doppler.  LEFT VENTRICLE PLAX 2D LVIDd:         4.70 cm   Diastology LVIDs:         2.50 cm   LV e' medial:    4.13 cm/s LV PW:         1.10  cm   LV E/e' medial:  14.3 LV IVS:        1.00 cm   LV e' lateral:   5.87 cm/s LVOT diam:     2.10 cm   LV E/e' lateral: 10.0 LV SV:         59 LV SV Index:   31 LVOT Area:     3.46 cm  RIGHT VENTRICLE RV S prime:     13.40 cm/s TAPSE (M-mode): 1.7 cm LEFT ATRIUM             Index        RIGHT ATRIUM          Index LA diam:        3.70 cm 1.91 cm/m   RA Area:     9.38 cm LA Vol (A2C):   59.8 ml 30.85 ml/m  RA Volume:   16.40 ml 8.46 ml/m LA Vol (A4C):   57.4 ml 29.61 ml/m LA Biplane Vol: 61.5 ml 31.72 ml/m  AORTIC VALVE AV Area (Vmax):    2.14 cm AV Area (Vmean):   2.14 cm AV Area (VTI):     2.19 cm AV Vmax:           130.78 cm/s AV Vmean:          81.483 cm/s AV VTI:            0.270 m AV Peak Grad:      6.8 mmHg AV Mean Grad:      3.2 mmHg LVOT Vmax:         80.70 cm/s LVOT Vmean:        50.400 cm/s LVOT VTI:          0.171 m LVOT/AV VTI ratio: 0.63  AORTA Ao Root diam: 3.40 cm MITRAL VALVE MV Area (PHT): 1.88 cm    SHUNTS MV Decel Time: 404 msec    Systemic VTI:  0.17 m MV E velocity: 58.90 cm/s  Systemic Diam: 2.10 cm MV A velocity: 99.60 cm/s MV E/A ratio:  0.59 Dina Rich MD Electronically signed by Dina Rich MD Signature Date/Time: 03/06/2024/3:30:12 PM    Final    DG CHEST PORT 1 VIEW Result Date: 03/06/2024 CLINICAL DATA:  Dyspnea EXAM: PORTABLE CHEST 1 VIEW COMPARISON:  X-ray 03/05/2024 and older FINDINGS: Elevated right hemidiaphragm. Underinflation overall. No consolidation, pneumothorax or effusion. No edema. Normal cardiopericardial silhouette. Overlapping cardiac leads. IMPRESSION:  No acute cardiopulmonary disease. Underinflated x-ray. Slight elevation of the right hemidiaphragm. Electronically Signed   By: Karen Kays M.D.   On: 03/06/2024 10:31   US Carotid Bilateral Result Date: 03/05/2024 CLINICAL DATA:  Found unresponsive 2 hours ago. Hypertension. Diabetes. EXAM: BILATERAL CAROTID DUPLEX ULTRASOUND TECHNIQUE: Wallace Cullens scale imaging, color Doppler and duplex ultrasound were performed of bilateral carotid and vertebral arteries in the neck. COMPARISON:  None available FINDINGS: Criteria: Quantification of carotid stenosis is based on velocity parameters that correlate the residual internal carotid diameter with NASCET-based stenosis levels, using the diameter of the distal internal carotid lumen as the denominator for stenosis measurement. The following velocity measurements were obtained: RIGHT ICA: 106/27 cm/sec CCA: 62/12 cm/sec SYSTOLIC ICA/CCA RATIO:  1.7 ECA: 99 cm/sec LEFT ICA: 44/16 cm/sec CCA: 78/16 cm/sec SYSTOLIC ICA/CCA RATIO:  0.6 ECA: 101 cm/sec RIGHT CAROTID ARTERY: No significant atheromatous plaque. RIGHT VERTEBRAL ARTERY:  Antegrade flow. LEFT CAROTID ARTERY:  No significant atheromatous plaque. LEFT VERTEBRAL ARTERY:  Antegrade flow. IMPRESSION: No significant stenosis of internal carotid arteries. Electronically  Signed   By: Acquanetta Belling M.D.   On: 03/05/2024 15:45   MR Brain Wo Contrast (neuro protocol) Result Date: 03/05/2024 CLINICAL DATA:  Provided history: Syncope/presyncope, cerebrovascular cause suspected. EXAM: MRI HEAD WITHOUT CONTRAST TECHNIQUE: Multiplanar, multiecho pulse sequences of the brain and surrounding structures were obtained without intravenous contrast. COMPARISON:  Head CT 03/05/2024.  Brain MRI 06/19/2013. FINDINGS: Brain: Chronic MCA territory cortical/subcortical infarcts within the left frontal and left parietal lobes, new from the prior brain MRI of 06/19/2013. Chronic lacunar infarct again demonstrated within the left corona radiata/basal  ganglia. Mild multifocal T2 FLAIR hyperintense signal abnormality elsewhere within the cerebral white matter and pons, nonspecific but compatible with chronic small vessel ischemic disease. There is no acute infarct. No evidence of an intracranial mass. No chronic intracranial blood products. No extra-axial fluid collection. No midline shift. Vascular: Maintained flow voids within the proximal large arterial vessels. Skull and upper cervical spine: No focal worrisome marrow lesion. Sinuses/Orbits: No mass or acute finding within the imaged orbits. Prior bilateral ocular lens replacement. No significant paranasal sinus disease. IMPRESSION: 1.  No evidence of an acute intracranial abnormality. 2. Chronic cortical/subcortical MCA territory infarcts within the left frontal and left parietal lobes, new from the prior brain MRI of 06/19/2013. 3. Unchanged chronic lacunar infarct within the left corona radiata/basal ganglia. 4. Background mild chronic small ischemic changes within the cerebral white matter and pons, progressed. Electronically Signed   By: Jackey Loge D.O.   On: 03/05/2024 13:01   DG Chest 1 View Result Date: 03/05/2024 CLINICAL DATA:  Weakness.  Shortness of breath. EXAM: CHEST  1 VIEW COMPARISON:  None Available. FINDINGS: Low lung volumes. The heart size and mediastinal contours are within normal limits. Mild bilateral interstitial prominence, which could reflect bronchovascular crowding secondary to hypoinflation, edema, or atypical infection. Small relatively homogeneous radiodensities projecting over the right neck, may be external to the patient. No acute osseous abnormality. IMPRESSION: Mild bilateral interstitial prominence, could reflect bronchovascular crowding secondary to hypoinflation, edema, or atypical infection. Electronically Signed   By: Hart Robinsons M.D.   On: 03/05/2024 12:38   CT Head Wo Contrast Result Date: 03/05/2024 CLINICAL DATA:  63 year old female with shortness of  breath, hypertensive, bradycardia, altered mental status. EXAM: CT HEAD WITHOUT CONTRAST TECHNIQUE: Contiguous axial images were obtained from the base of the skull through the vertex without intravenous contrast. RADIATION DOSE REDUCTION: This exam was performed according to the departmental dose-optimization program which includes automated exposure control, adjustment of the mA and/or kV according to patient size and/or use of iterative reconstruction technique. COMPARISON:  Head CT 05/02/2021. FINDINGS: Brain: Chronic lacunar infarct anterior left corona radiata and basal ganglia. Overlying cortically based infarct with encephalomalacia at the anterior, middle left MCA vascular territory is new since 20/2, but appears to be chronic with ex vacuo ventricular enlargement there. Mild contralateral right frontal lobe white matter hypodensity has not significantly changed. No midline shift, ventriculomegaly, mass effect, evidence of mass lesion, intracranial hemorrhage or evidence of cortically based acute infarction. Gray-white matter differentiation is within normal limits throughout the brain. *CRASH * that Vascular: Calcified atherosclerosis at the skull base. No suspicious intracranial vascular hyperdensity. Skull: Intact.  No acute osseous abnormality identified. Sinuses/Orbits: Visualized paranasal sinuses and mastoids are stable and well aerated. Other: No acute orbit or scalp soft tissue finding. IMPRESSION: 1. Progressed since 2022 but chronic appearing anterior left MCA vascular territory ischemia. 2. No acute intracranial abnormality. Electronically Signed   By: Odessa Fleming  M.D.   On: 03/05/2024 11:34    Microbiology: Results for orders placed or performed during the hospital encounter of 03/05/24  Resp panel by RT-PCR (RSV, Flu A&B, Covid) Anterior Nasal Swab     Status: None   Collection Time: 03/05/24 11:15 AM   Specimen: Anterior Nasal Swab  Result Value Ref Range Status   SARS Coronavirus 2 by  RT PCR NEGATIVE NEGATIVE Final    Comment: (NOTE) SARS-CoV-2 target nucleic acids are NOT DETECTED.  The SARS-CoV-2 RNA is generally detectable in upper respiratory specimens during the acute phase of infection. The lowest concentration of SARS-CoV-2 viral copies this assay can detect is 138 copies/mL. A negative result does not preclude SARS-Cov-2 infection and should not be used as the sole basis for treatment or other patient management decisions. A negative result may occur with  improper specimen collection/handling, submission of specimen other than nasopharyngeal swab, presence of viral mutation(s) within the areas targeted by this assay, and inadequate number of viral copies(<138 copies/mL). A negative result must be combined with clinical observations, patient history, and epidemiological information. The expected result is Negative.  Fact Sheet for Patients:  BloggerCourse.com  Fact Sheet for Healthcare Providers:  SeriousBroker.it  This test is no t yet approved or cleared by the [place] FDA and  has been authorized for detection and/or diagnosis of SARS-CoV-2 by FDA under an Emergency Use Authorization (EUA). This EUA will remain  in effect (meaning this test can be used) for the duration of the COVID-19 declaration under Section 564(b)(1) of the Act, 21 U.S.C.section 360bbb-3(b)(1), unless the authorization is terminated  or revoked sooner.       Influenza A by PCR NEGATIVE NEGATIVE Final   Influenza B by PCR NEGATIVE NEGATIVE Final    Comment: (NOTE) The Xpert Xpress SARS-CoV-2/FLU/RSV plus assay is intended as an aid in the diagnosis of influenza from Nasopharyngeal swab specimens and should not be used as a sole basis for treatment. Nasal washings and aspirates are unacceptable for Xpert Xpress SARS-CoV-2/FLU/RSV testing.  Fact Sheet for Patients: BloggerCourse.com  Fact Sheet  for Healthcare Providers: SeriousBroker.it  This test is not yet approved or cleared by the [place] FDA and has been authorized for detection and/or diagnosis of SARS-CoV-2 by FDA under an Emergency Use Authorization (EUA). This EUA will remain in effect (meaning this test can be used) for the duration of the COVID-19 declaration under Section 564(b)(1) of the Act, 21 U.S.C. section 360bbb-3(b)(1), unless the authorization is terminated or revoked.     Resp Syncytial Virus by PCR NEGATIVE NEGATIVE Final    Comment: (NOTE) Fact Sheet for Patients: BloggerCourse.com  Fact Sheet for Healthcare Providers: SeriousBroker.it  This test is not yet approved or cleared by the [place] FDA and has been authorized for detection and/or diagnosis of SARS-CoV-2 by FDA under an Emergency Use Authorization (EUA). This EUA will remain in effect (meaning this test can be used) for the duration of the COVID-19 declaration under Section 564(b)(1) of the Act, 21 U.S.C. section 360bbb-3(b)(1), unless the authorization is terminated or revoked.  Performed at [facility], [address] [place], [place] [address]     Labs: CBC: Recent Labs  Lab 03/05/24 1127  WBC 10.4  NEUTROABS 7.8*  HGB 12.2  HCT 37.4  MCV 84.6  PLT 264   Basic Metabolic Panel: Recent Labs  Lab 03/05/24 1127 03/06/24 0456 03/06/24 0500  NA 136 138  --   K 3.9 3.9  --   CL  105 107  --   CO2 20* 22  --   GLUCOSE 221* 170*  --   BUN 26* 23*  --   CREATININE 1.04* 0.99  --   CALCIUM 9.5 9.3  --   MG 1.7 2.1 1.7   Liver Function Tests: Recent Labs  Lab 03/05/24 1127  AST 24  ALT 25  ALKPHOS 67  BILITOT 1.0  PROT 7.1  ALBUMIN 4.1   CBG: Recent Labs  Lab 03/05/24 1659 03/05/24 2047 03/06/24 0742 03/06/24 1139  GLUCAP 150* 165* 151* 187*    Discharge time spent: greater than 30 minutes.  Signed: Catarina Hartshorn,  MD [facility] 03/06/2024

## 2024-03-06 NOTE — Progress Notes (Signed)
 Patient is alert & oriented x 4.  [facility] and has a guard sitting outside the room.  Patient's right arm is handcuffed to the bed rail.  Patient's skin is intact under the cuffs.

## 2024-03-06 NOTE — Plan of Care (Signed)
  Problem: Education: Goal: Knowledge of General Education information will improve Description: Including pain rating scale, medication(s)/side effects and non-pharmacologic comfort measures Outcome: Progressing   Problem: Health Behavior/Discharge Planning: Goal: Ability to manage health-related needs will improve Outcome: Progressing   Problem: Clinical Measurements: Goal: Ability to maintain clinical measurements within normal limits will improve Outcome: Progressing Goal: Will remain free from infection Outcome: Progressing Goal: Diagnostic test results will improve Outcome: Progressing Goal: Respiratory complications will improve Outcome: Progressing Goal: Cardiovascular complication will be avoided Outcome: Progressing   Problem: Activity: Goal: Risk for activity intolerance will decrease Outcome: Progressing   Problem: Nutrition: Goal: Adequate nutrition will be maintained Outcome: Progressing   Problem: Coping: Goal: Level of anxiety will decrease Outcome: Progressing   Problem: Elimination: Goal: Will not experience complications related to bowel motility Outcome: Progressing Goal: Will not experience complications related to urinary retention Outcome: Progressing   Problem: Pain Managment: Goal: General experience of comfort will improve and/or be controlled Outcome: Progressing   Problem: Safety: Goal: Ability to remain free from injury will improve Outcome: Progressing   Problem: Skin Integrity: Goal: Risk for impaired skin integrity will decrease Outcome: Progressing   Problem: Education: Goal: Ability to describe self-care measures that may prevent or decrease complications (Diabetes Survival Skills Education) will improve Outcome: Progressing Goal: Individualized Educational Video(s) Outcome: Progressing   Problem: Coping: Goal: Ability to adjust to condition or change in health will improve Outcome: Progressing   Problem: Fluid  Volume: Goal: Ability to maintain a balanced intake and output will improve Outcome: Progressing   Problem: Health Behavior/Discharge Planning: Goal: Ability to identify and utilize available resources and services will improve Outcome: Progressing Goal: Ability to manage health-related needs will improve Outcome: Progressing   Problem: Metabolic: Goal: Ability to maintain appropriate glucose levels will improve Outcome: Progressing   Problem: Nutritional: Goal: Maintenance of adequate nutrition will improve Outcome: Progressing Goal: Progress toward achieving an optimal weight will improve Outcome: Progressing   Problem: Skin Integrity: Goal: Risk for impaired skin integrity will decrease Outcome: Progressing   Problem: Tissue Perfusion: Goal: Adequacy of tissue perfusion will improve Outcome: Progressing   Problem: Education: Goal: Knowledge of condition and prescribed therapy will improve Outcome: Progressing   Problem: Cardiac: Goal: Will achieve and/or maintain adequate cardiac output Outcome: Progressing   Problem: Physical Regulation: Goal: Complications related to the disease process, condition or treatment will be avoided or minimized Outcome: Progressing

## 2024-03-07 LAB — HEMOGLOBIN A1C
Hgb A1c MFr Bld: 8.4 % — ABNORMAL HIGH (ref 4.8–5.6)
Mean Plasma Glucose: 194 mg/dL

## 2024-06-15 ENCOUNTER — Emergency Department (HOSPITAL_COMMUNITY)

## 2024-06-15 ENCOUNTER — Encounter (HOSPITAL_COMMUNITY): Payer: Self-pay

## 2024-06-15 ENCOUNTER — Emergency Department (EMERGENCY_DEPARTMENT_HOSPITAL)
Admission: EM | Admit: 2024-06-15 | Discharge: 2024-06-15 | Disposition: A | Discharge status: Other Acute Inpatient Hospital | Source: Home / Self Care | Attending: Emergency Medicine | Admitting: Emergency Medicine

## 2024-06-15 ENCOUNTER — Other Ambulatory Visit: Payer: Self-pay

## 2024-06-15 DIAGNOSIS — Z7984 Long term (current) use of oral hypoglycemic drugs: Secondary | ICD-10-CM | POA: Insufficient documentation

## 2024-06-15 DIAGNOSIS — I110 Hypertensive heart disease with heart failure: Secondary | ICD-10-CM | POA: Insufficient documentation

## 2024-06-15 DIAGNOSIS — I5022 Chronic systolic (congestive) heart failure: Secondary | ICD-10-CM | POA: Insufficient documentation

## 2024-06-15 DIAGNOSIS — T1491XA Suicide attempt, initial encounter: Secondary | ICD-10-CM

## 2024-06-15 DIAGNOSIS — Z79899 Other long term (current) drug therapy: Secondary | ICD-10-CM | POA: Insufficient documentation

## 2024-06-15 DIAGNOSIS — E1142 Type 2 diabetes mellitus with diabetic polyneuropathy: Secondary | ICD-10-CM | POA: Insufficient documentation

## 2024-06-15 DIAGNOSIS — R45851 Suicidal ideations: Secondary | ICD-10-CM | POA: Insufficient documentation

## 2024-06-15 DIAGNOSIS — E039 Hypothyroidism, unspecified: Secondary | ICD-10-CM | POA: Insufficient documentation

## 2024-06-15 DIAGNOSIS — Z733 Stress, not elsewhere classified: Secondary | ICD-10-CM | POA: Insufficient documentation

## 2024-06-15 DIAGNOSIS — Z7982 Long term (current) use of aspirin: Secondary | ICD-10-CM | POA: Insufficient documentation

## 2024-06-15 DIAGNOSIS — Z955 Presence of coronary angioplasty implant and graft: Secondary | ICD-10-CM | POA: Insufficient documentation

## 2024-06-15 DIAGNOSIS — F332 Major depressive disorder, recurrent severe without psychotic features: Secondary | ICD-10-CM | POA: Diagnosis not present

## 2024-06-15 DIAGNOSIS — Z794 Long term (current) use of insulin: Secondary | ICD-10-CM | POA: Insufficient documentation

## 2024-06-15 LAB — CBC WITH DIFFERENTIAL/PLATELET
Abs Immature Granulocytes: 0.01 K/uL (ref 0.00–0.07)
Basophils Absolute: 0.1 K/uL (ref 0.0–0.1)
Basophils Relative: 1 %
Eosinophils Absolute: 0.4 K/uL (ref 0.0–0.5)
Eosinophils Relative: 5 %
HCT: 37.2 % (ref 36.0–46.0)
Hemoglobin: 12 g/dL (ref 12.0–15.0)
Immature Granulocytes: 0 %
Lymphocytes Relative: 28 %
Lymphs Abs: 2.2 K/uL (ref 0.7–4.0)
MCH: 27.1 pg (ref 26.0–34.0)
MCHC: 32.3 g/dL (ref 30.0–36.0)
MCV: 84 fL (ref 80.0–100.0)
Monocytes Absolute: 0.7 K/uL (ref 0.1–1.0)
Monocytes Relative: 8 %
Neutro Abs: 4.5 K/uL (ref 1.7–7.7)
Neutrophils Relative %: 58 %
Platelets: 263 K/uL (ref 150–400)
RBC: 4.43 MIL/uL (ref 3.87–5.11)
RDW: 12.8 % (ref 11.5–15.5)
WBC: 7.7 K/uL (ref 4.0–10.5)
nRBC: 0 % (ref 0.0–0.2)

## 2024-06-15 LAB — COMPREHENSIVE METABOLIC PANEL WITH GFR
ALT: 20 U/L (ref 0–44)
AST: 18 U/L (ref 15–41)
Albumin: 3.9 g/dL (ref 3.5–5.0)
Alkaline Phosphatase: 86 U/L (ref 38–126)
Anion gap: 10 (ref 5–15)
BUN: 23 mg/dL — ABNORMAL HIGH (ref 6–20)
CO2: 23 mmol/L (ref 22–32)
Calcium: 10 mg/dL (ref 8.9–10.3)
Chloride: 103 mmol/L (ref 98–111)
Creatinine, Ser: 0.89 mg/dL (ref 0.44–1.00)
GFR, Estimated: >60 mL/min (ref >60)
Glucose, Bld: 260 mg/dL — ABNORMAL HIGH (ref 70–99)
Potassium: 4.8 mmol/L (ref 3.5–5.1)
Sodium: 136 mmol/L (ref 135–145)
Total Bilirubin: 0.7 mg/dL (ref 0.0–1.2)
Total Protein: 7.3 g/dL (ref 6.5–8.1)

## 2024-06-15 LAB — RAPID URINE DRUG SCREEN, HOSP PERFORMED
Amphetamines: NOT DETECTED
Barbiturates: NOT DETECTED
Benzodiazepines: NOT DETECTED
Cocaine: NOT DETECTED
Opiates: NOT DETECTED
Tetrahydrocannabinol: NOT DETECTED

## 2024-06-15 LAB — ETHANOL: Alcohol, Ethyl (B): <15 mg/dL (ref <15)

## 2024-06-15 MED ORDER — CLOPIDOGREL BISULFATE 75 MG PO TABS
75.0000 mg | ORAL_TABLET | Freq: Every day | ORAL | Status: DC
  Administered 2024-06-15: 75 mg via ORAL
  Filled 2024-06-15: qty 1

## 2024-06-15 MED ORDER — DAPAGLIFLOZIN PROPANEDIOL 10 MG PO TABS
10.0000 mg | ORAL_TABLET | Freq: Every day | ORAL | Status: DC
  Administered 2024-06-15: 10 mg via ORAL
  Filled 2024-06-15 (×3): qty 1

## 2024-06-15 MED ORDER — LINACLOTIDE 145 MCG PO CAPS
145.0000 ug | ORAL_CAPSULE | Freq: Every day | ORAL | Status: DC
  Administered 2024-06-15: 145 ug via ORAL
  Filled 2024-06-15: qty 1

## 2024-06-15 MED ORDER — INSULIN GLARGINE-YFGN 100 UNIT/ML ~~LOC~~ SOLN
20.0000 [IU] | Freq: Every day | SUBCUTANEOUS | Status: DC
  Administered 2024-06-15: 20 [IU] via SUBCUTANEOUS
  Filled 2024-06-15: qty 0.2

## 2024-06-15 MED ORDER — ASPIRIN 81 MG PO TBEC
81.0000 mg | DELAYED_RELEASE_TABLET | Freq: Every day | ORAL | Status: DC
  Administered 2024-06-15: 81 mg via ORAL
  Filled 2024-06-15: qty 1

## 2024-06-15 MED ORDER — METFORMIN HCL 500 MG PO TABS: 1000.0000 mg | ORAL_TABLET | Freq: Two times a day (BID) | ORAL | Status: DC

## 2024-06-15 MED ORDER — LISINOPRIL 10 MG PO TABS
40.0000 mg | ORAL_TABLET | Freq: Every day | ORAL | Status: DC
  Administered 2024-06-15: 40 mg via ORAL
  Filled 2024-06-15: qty 4

## 2024-06-15 MED ORDER — CARVEDILOL 3.125 MG PO TABS: 6.2500 mg | ORAL_TABLET | Freq: Two times a day (BID) | ORAL | Status: DC

## 2024-06-15 MED ORDER — LEVOTHYROXINE SODIUM 112 MCG PO TABS
112.0000 ug | ORAL_TABLET | Freq: Every day | ORAL | Status: DC
  Administered 2024-06-15: 112 ug via ORAL
  Filled 2024-06-15: qty 1

## 2024-06-15 MED ORDER — FUROSEMIDE 40 MG PO TABS
40.0000 mg | ORAL_TABLET | Freq: Every day | ORAL | Status: DC
  Administered 2024-06-15: 40 mg via ORAL
  Filled 2024-06-15: qty 1

## 2024-06-15 MED ORDER — DIAZEPAM 5 MG PO TABS
5.0000 mg | ORAL_TABLET | Freq: Once | ORAL | Status: AC
  Administered 2024-06-15: 5 mg via ORAL
  Filled 2024-06-15 (×2): qty 1
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
## 2024-06-15 NOTE — ED Notes (Signed)
 Report has been called to [facility]. Kellogg RN

## 2024-06-15 NOTE — BH Assessment (Signed)
 IRIS consult entered at 1500 hours Olivia to coordinate

## 2024-06-15 NOTE — Progress Notes (Signed)
 Pt has been accepted to [facility] on 06/15/2024. Bed assignment:304-1  Pt meets inpatient criteria per Erla Rase, MD  Attending Physician will be Dr. Prentis   Report can be called to: Adult unit: [phone]  Pt can arrive ASAP  Care Team Notified: [facility] Bretta Qua, RN, Kellogg, RN

## 2024-06-15 NOTE — ED Notes (Addendum)
 Pt IVC papers have been faxed to [facility] @[phone] 

## 2024-06-15 NOTE — ED Notes (Signed)
 TTS in process. Kellogg RN
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
## 2024-06-15 NOTE — ED Notes (Addendum)
 Patient dressed out into [facility] scrubs, patient belongings placed in belongings bag include, t shirt, pants, shoes, and a purse with self care items, hairbrush, toothpaste and toothbrush, face wash, body wash, and cell phone and charger. Patient bags labeled with patient specific sticker and placed in patient locker 4.   Patient ambulates independently, and provided urine sample while changing for [facility] process   Patient wanded by security at this time

## 2024-06-15 NOTE — ED Notes (Addendum)
 Pt resting comfortably. She is calm and cooperative but with blunted affect. Kellogg RN

## 2024-06-16 ENCOUNTER — Encounter (HOSPITAL_COMMUNITY): Payer: Self-pay

## 2024-06-16 ENCOUNTER — Inpatient Hospital Stay (HOSPITAL_COMMUNITY)
Admission: AD | Admit: 2024-06-16 | Discharge: 2024-06-24 | DRG: 885 | Disposition: A | Discharge status: Home / Self Care | Source: Intra-hospital | Attending: Student in an Organized Health Care Education/Training Program | Admitting: Student in an Organized Health Care Education/Training Program

## 2024-06-16 DIAGNOSIS — Z91410 Personal history of adult physical and sexual abuse: Secondary | ICD-10-CM | POA: Diagnosis not present

## 2024-06-16 DIAGNOSIS — K5900 Constipation, unspecified: Secondary | ICD-10-CM | POA: Diagnosis present

## 2024-06-16 DIAGNOSIS — Z79899 Other long term (current) drug therapy: Secondary | ICD-10-CM | POA: Diagnosis not present

## 2024-06-16 DIAGNOSIS — Z79890 Hormone replacement therapy (postmenopausal): Secondary | ICD-10-CM

## 2024-06-16 DIAGNOSIS — E669 Obesity, unspecified: Secondary | ICD-10-CM | POA: Diagnosis present

## 2024-06-16 DIAGNOSIS — Z91411 Personal history of adult psychological abuse: Secondary | ICD-10-CM

## 2024-06-16 DIAGNOSIS — I10 Essential (primary) hypertension: Secondary | ICD-10-CM | POA: Diagnosis present

## 2024-06-16 DIAGNOSIS — E1140 Type 2 diabetes mellitus with diabetic neuropathy, unspecified: Secondary | ICD-10-CM | POA: Diagnosis present

## 2024-06-16 DIAGNOSIS — Z608 Other problems related to social environment: Secondary | ICD-10-CM | POA: Diagnosis present

## 2024-06-16 DIAGNOSIS — Z885 Allergy status to narcotic agent status: Secondary | ICD-10-CM

## 2024-06-16 DIAGNOSIS — F332 Major depressive disorder, recurrent severe without psychotic features: Principal | ICD-10-CM | POA: Diagnosis present

## 2024-06-16 DIAGNOSIS — Z5902 Unsheltered homelessness: Secondary | ICD-10-CM

## 2024-06-16 DIAGNOSIS — Z8249 Family history of ischemic heart disease and other diseases of the circulatory system: Secondary | ICD-10-CM | POA: Diagnosis not present

## 2024-06-16 DIAGNOSIS — R110 Nausea: Secondary | ICD-10-CM | POA: Diagnosis present

## 2024-06-16 DIAGNOSIS — E039 Hypothyroidism, unspecified: Secondary | ICD-10-CM | POA: Diagnosis present

## 2024-06-16 DIAGNOSIS — G4700 Insomnia, unspecified: Secondary | ICD-10-CM | POA: Diagnosis present

## 2024-06-16 DIAGNOSIS — F419 Anxiety disorder, unspecified: Secondary | ICD-10-CM | POA: Diagnosis present

## 2024-06-16 DIAGNOSIS — Z7984 Long term (current) use of oral hypoglycemic drugs: Secondary | ICD-10-CM

## 2024-06-16 DIAGNOSIS — Z7982 Long term (current) use of aspirin: Secondary | ICD-10-CM

## 2024-06-16 DIAGNOSIS — Z9151 Personal history of suicidal behavior: Secondary | ICD-10-CM

## 2024-06-16 DIAGNOSIS — Z794 Long term (current) use of insulin: Secondary | ICD-10-CM

## 2024-06-16 DIAGNOSIS — Z8673 Personal history of transient ischemic attack (TIA), and cerebral infarction without residual deficits: Secondary | ICD-10-CM

## 2024-06-16 DIAGNOSIS — Z6833 Body mass index (BMI) 33.0-33.9, adult: Secondary | ICD-10-CM

## 2024-06-16 DIAGNOSIS — F39 Unspecified mood [affective] disorder: Principal | ICD-10-CM

## 2024-06-16 DIAGNOSIS — R45851 Suicidal ideations: Secondary | ICD-10-CM | POA: Diagnosis present

## 2024-06-16 DIAGNOSIS — Z7902 Long term (current) use of antithrombotics/antiplatelets: Secondary | ICD-10-CM | POA: Diagnosis not present

## 2024-06-16 MED ORDER — TRAZODONE HCL 50 MG PO TABS
50.0000 mg | ORAL_TABLET | Freq: Every evening | ORAL | Status: DC | PRN
  Administered 2024-06-16 – 2024-06-23 (×8): 50 mg via ORAL
  Filled 2024-06-16 (×8): qty 1

## 2024-06-16 MED ORDER — ALUM & MAG HYDROXIDE-SIMETH 200-200-20 MG/5ML PO SUSP: 30.0000 mL | ORAL | Status: DC | PRN

## 2024-06-16 MED ORDER — MAGNESIUM HYDROXIDE 400 MG/5ML PO SUSP
30.0000 mL | Freq: Every day | ORAL | Status: DC | PRN
  Administered 2024-06-22: 30 mL via ORAL
  Filled 2024-06-16: qty 30

## 2024-06-16 MED ORDER — OLANZAPINE 10 MG IM SOLR: 5.0000 mg | Freq: Three times a day (TID) | INTRAMUSCULAR | Status: DC | PRN

## 2024-06-16 MED ORDER — HYDROXYZINE HCL 25 MG PO TABS
25.0000 mg | ORAL_TABLET | Freq: Three times a day (TID) | ORAL | Status: DC | PRN
  Administered 2024-06-16 – 2024-06-20 (×6): 25 mg via ORAL
  Filled 2024-06-16 (×6): qty 1

## 2024-06-16 MED ORDER — OLANZAPINE 5 MG PO TBDP: 5.0000 mg | ORAL_TABLET | Freq: Three times a day (TID) | ORAL | Status: DC | PRN

## 2024-06-16 MED ORDER — SERTRALINE HCL 50 MG PO TABS
50.0000 mg | ORAL_TABLET | Freq: Every day | ORAL | Status: DC
  Administered 2024-06-16 – 2024-06-20 (×5): 50 mg via ORAL
  Filled 2024-06-16 (×5): qty 1

## 2024-06-16 MED ORDER — ACETAMINOPHEN 325 MG PO TABS
650.0000 mg | ORAL_TABLET | Freq: Four times a day (QID) | ORAL | Status: DC | PRN
  Administered 2024-06-16 – 2024-06-22 (×9): 650 mg via ORAL
  Filled 2024-06-16 (×9): qty 2

## 2024-06-16 MED ORDER — OLANZAPINE 10 MG IM SOLR: 10.0000 mg | Freq: Three times a day (TID) | INTRAMUSCULAR | Status: DC | PRN

## 2024-06-16 NOTE — Progress Notes (Signed)
   06/16/24 2120  Psych Admission Type (Psych Patients Only)  Admission Status Involuntary  Psychosocial Assessment  Patient Complaints Depression;Anxiety;Self-harm thoughts  Eye Contact Fair  Facial Expression Flat  Affect Flat  Speech Slow  Interaction Guarded  Motor Activity Slow  Appearance/Hygiene In hospital gown  Behavior Characteristics Cooperative;Anxious;Guarded  Mood Depressed;Anxious;Suspicious  Thought Process  Coherency WDL  Content WDL  Delusions None reported or observed  Perception WDL  Hallucination None reported or observed  Judgment Poor  Confusion None  Danger to Self  Current suicidal ideation? Passive  Agreement Not to Harm Self Yes  Description of Agreement verbal  Danger to Others  Danger to Others None reported or observed   Progress note   D: Pt seen at med window. Pt denies HI, AVH. Pt endorses passive SI, stating, there isn't enough water her to do anything. Pt contracts for safety.  Pt rates pain  8/10 as chronic left neck pain. Pt rates anxiety  8/10 and depression  9/10. Guarded in interaction. Flat affect. 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 I would only hurt myself. No other concerns noted at this time.  A: Pt provided support and encouragement. Pt given scheduled medication as prescribed. PRNs as appropriate. Q15 min checks for safety.   R: Pt safe on the unit. Will continue to monitor.

## 2024-06-16 NOTE — Progress Notes (Signed)
 Adult Psychoeducational Group Note  Date:  06/16/2024 Time:  10:05 PM  Group Topic/Focus:  Wrap-Up Group:   The focus of this group is to help patients review their daily goal of treatment and discuss progress on daily workbooks.  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 She came to [facility] seek help she tried to hurt herself in a bad way with no return.  Lang Drilling Long 06/16/2024, 10:05 PM

## 2024-06-16 NOTE — Plan of Care (Signed)
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
## 2024-06-16 NOTE — Plan of Care (Signed)
 Nurse discussed anxiety, depression and coping skills with patient.

## 2024-06-16 NOTE — Plan of Care (Addendum)
 D:  Patient's self inventory sheet, patient has poor sleep, no sleep medicine given.  Poor appetite, low energy level, poor concentration.  Rated depression 4, hopeless 5, anxiety 6.  Denied withdrawals.  Denied SI, then checked sometimes.  Physical problems, dizzy, pain, headache.  Physical pain, worst pain #5 in past 24 hours, neck, back, hands, shoulders.  Does take pain medicine.  Goal, none.  Plans, no.  No discharge plans. A:  Medications administered per MD orders.  Emotional support and encouragement given patient. R:  Denied SI while talking to  nurse today, contracts for safety.  Denied HI.  Denied A/V hallucinations.  Safety maintained with 15 minute checks.
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
## 2024-06-17 ENCOUNTER — Encounter (HOSPITAL_COMMUNITY): Payer: Self-pay

## 2024-06-17 DIAGNOSIS — Z8673 Personal history of transient ischemic attack (TIA), and cerebral infarction without residual deficits: Secondary | ICD-10-CM

## 2024-06-17 DIAGNOSIS — R45851 Suicidal ideations: Secondary | ICD-10-CM

## 2024-06-17 LAB — GLUCOSE, CAPILLARY
Glucose-Capillary: 199 mg/dL — ABNORMAL HIGH (ref 70–99)
Glucose-Capillary: 189 mg/dL — ABNORMAL HIGH (ref 70–99)

## 2024-06-17 MED ORDER — ONDANSETRON 4 MG PO TBDP: 4.0000 mg | ORAL_TABLET | ORAL | Status: DC | PRN

## 2024-06-17 MED ORDER — INSULIN ASPART 100 UNIT/ML IJ SOLN
0.0000 [IU] | Freq: Every day | INTRAMUSCULAR | Status: DC
  Administered 2024-06-18: 3 [IU] via SUBCUTANEOUS
  Administered 2024-06-19: 2 [IU] via SUBCUTANEOUS
  Administered 2024-06-20: 5 [IU] via SUBCUTANEOUS
  Administered 2024-06-21 – 2024-06-23 (×2): 2 [IU] via SUBCUTANEOUS

## 2024-06-17 MED ORDER — INSULIN ASPART 100 UNIT/ML IJ SOLN
0.0000 [IU] | Freq: Three times a day (TID) | INTRAMUSCULAR | Status: DC
  Administered 2024-06-17 – 2024-06-19 (×5): 3 [IU] via SUBCUTANEOUS
  Administered 2024-06-19: 5 [IU] via SUBCUTANEOUS
  Administered 2024-06-19: 3 [IU] via SUBCUTANEOUS
  Administered 2024-06-20: 5 [IU] via SUBCUTANEOUS
  Administered 2024-06-20: 3 [IU] via SUBCUTANEOUS
  Administered 2024-06-20: 5 [IU] via SUBCUTANEOUS
  Administered 2024-06-21 (×2): 3 [IU] via SUBCUTANEOUS
  Administered 2024-06-21: 8 [IU] via SUBCUTANEOUS
  Administered 2024-06-22: 2 [IU] via SUBCUTANEOUS
  Administered 2024-06-22: 3 [IU] via SUBCUTANEOUS
  Administered 2024-06-22: 5 [IU] via SUBCUTANEOUS
  Administered 2024-06-23: 3 [IU] via SUBCUTANEOUS
  Administered 2024-06-23: 8 [IU] via SUBCUTANEOUS
  Administered 2024-06-23: 5 [IU] via SUBCUTANEOUS
  Administered 2024-06-24: 2 [IU] via SUBCUTANEOUS

## 2024-06-17 NOTE — Plan of Care (Signed)
   Problem: Education: Goal: Emotional status will improve Outcome: Progressing   Problem: Activity: Goal: Interest or engagement in activities will improve Outcome: Progressing
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
## 2024-06-17 NOTE — Progress Notes (Signed)
   06/17/24 1500  Psych Admission Type (Psych Patients Only)  Admission Status Involuntary  Psychosocial Assessment  Patient Complaints Depression  Eye Contact Fair  Facial Expression Flat  Affect Flat  Speech Slow  Interaction Guarded  Motor Activity Slow  Appearance/Hygiene In hospital gown  Behavior Characteristics Guarded  Mood Depressed  Thought Process  Coherency WDL  Content WDL  Delusions None reported or observed  Perception WDL  Hallucination None reported or observed  Judgment Poor  Confusion None  Danger to Self  Current suicidal ideation? Passive  Danger to Others  Danger to Others None reported or observed   Dar Note: Patient presents with a flat affect and depressed mood.  Medications given as prescribed.  Minimal interactions with staff and peers.  Reports passive SI but verbally contracts for safety.Routine safety checks maintained.  Patient is safe on and off the unit.

## 2024-06-17 NOTE — Plan of Care (Signed)
  Problem: Activity: Goal: Sleeping patterns will improve Outcome: Progressing   Problem: Coping: Goal: Ability to demonstrate self-control will improve Outcome: Progressing   Problem: Safety: Goal: Periods of time without injury will increase Outcome: Progressing   

## 2024-06-17 NOTE — Progress Notes (Signed)
   06/17/24 0556  15 Minute Checks  Location Bedroom  Visual Appearance Calm  Behavior Sleeping  Sleep (Behavioral Health Patients Only)  Calculate sleep? (Click Yes once per 24 hr at 0600 safety check) Yes  Documented sleep last 24 hours 8.25

## 2024-06-17 NOTE — Group Note (Signed)
 Recreation Therapy Group Note   Group Topic:Problem Solving  Group Date: 06/17/2024 Start Time: 0930 End Time: 1000 Facilitators: Luwanda Starr-McCall, LRT,CTRS Location: [address] Dayroom   Group Topic: Problem Solving  Goal Area(s) Addresses:  Patient will effectively work in a team with other group members. Patient will verbalize importance of using appropriate problem solving techniques.  Patient will identify positive change associated with effective problem solving skills.   Behavioral Response:   Intervention: Worksheet, Music  Activity: Science writer. Patients were given a front and back worksheet of brain teasers. Patients worked together to figure out each individual puzzle presented on sheet. Patients had 20 minutes to complete the brain teasers. LRT went over the answers with patients at the end.    Education: Problem Solving, Communication, Cognitive Skills  Education Outcome: Acknowledges understanding/In group clarification offered/Needs additional education.    Affect/Mood: N/A   Participation Level: Did not attend    Clinical Observations/Individualized Feedback:     Plan: Continue to engage patient in RT group sessions 2-3x/week.   Ceclia Koker-McCall, LRT,CTRS 06/17/2024 12:28 PM

## 2024-06-17 NOTE — BH IP Treatment Plan (Signed)
 Interdisciplinary Treatment and Diagnostic Plan Update  06/17/2024 Time of Session: 1038 Ashley Watts MRN: [id number]  Principal Diagnosis: Mood disorder [facility])  Secondary Diagnoses: Principal Problem:   Mood disorder (HCC) Active Problems:   History of CVA (cerebrovascular accident)   Suicidal ideation   Current Medications:  Current Facility-Administered Medications  Medication Dose Route Frequency Provider Last Rate Last Admin   acetaminophen  (TYLENOL ) tablet 650 mg  650 mg Oral Q6H PRN Brent, Amanda C, NP   650 mg at 06/17/24 0830   alum & mag hydroxide-simeth (MAALOX/MYLANTA) 200-200-20 MG/5ML suspension 30 mL  30 mL Oral Q4H PRN Brent, Amanda C, NP       hydrOXYzine  (ATARAX ) tablet 25 mg  25 mg Oral TID PRN Brent, Amanda C, NP   25 mg at 06/16/24 2122   magnesium  hydroxide (MILK OF MAGNESIA) suspension 30 mL  30 mL Oral Daily PRN Brent, Amanda C, NP       OLANZapine  (ZYPREXA ) injection 10 mg  10 mg Intramuscular TID PRN Brent, Amanda C, NP       OLANZapine  (ZYPREXA ) injection 5 mg  5 mg Intramuscular TID PRN Brent, Amanda C, NP       OLANZapine  zydis (ZYPREXA ) disintegrating tablet 5 mg  5 mg Oral TID PRN Brent, Amanda C, NP       ondansetron  (ZOFRAN -ODT) disintegrating tablet 4 mg  4 mg Oral Q4H PRN Faunce, Alina, DO       sertraline  (ZOLOFT ) tablet 50 mg  50 mg Oral Daily Stevens, Briana N, MD   50 mg at 06/17/24 9171   traZODone  (DESYREL ) tablet 50 mg  50 mg Oral QHS PRN Brent, Amanda C, NP   50 mg at 06/16/24 2121   PTA Medications: Medications Prior to Admission  Medication Sig Dispense Refill Last Dose/Taking   aspirin  EC 81 MG EC tablet Take 1 tablet (81 mg total) by mouth daily. (Patient taking differently: Take 325 mg by mouth daily.) 30 tablet 0    blood glucose meter kit and supplies Dispense based on patient and insurance preference. Use up to four times daily as directed. (FOR ICD-10 E10.9, E11.9). 1 each 0    Blood Glucose Monitoring Suppl (ONETOUCH VERIO)  w/Device KIT 1 kit by Does not apply route 4 (four) times daily. 1 kit 0    carvedilol  (COREG ) 6.25 MG tablet Take 1 tablet (6.25 mg total) by mouth 2 (two) times daily with a meal.      clopidogrel  (PLAVIX ) 75 MG tablet Take 75 mg by mouth daily.      dapagliflozin  propanediol (FARXIGA ) 10 MG TABS tablet Take 10 mg by mouth daily. (Patient not taking: Reported on 06/15/2024)      furosemide  (LASIX ) 40 MG tablet Take 1 tablet (40 mg total) by mouth daily. 30 tablet 3    GLUCOPHAGE  1000 MG tablet Take 1 tablet (1,000 mg total) by mouth 2 (two) times daily with a meal. 180 tablet 1    glucose blood (ACCU-CHEK AVIVA) test strip Check glucose up to QID Dx E11.9 400 each 3    HYDROcodone-acetaminophen  (NORCO/VICODIN) 5-325 MG tablet Take 1 tablet by mouth 2 (two) times daily as needed for moderate pain (pain score 4-6).      insulin  glargine (LANTUS  SOLOSTAR) 100 UNIT/ML Solostar Pen Inject 20 Units into the skin at bedtime. (Patient taking differently: Inject 40 Units into the skin at bedtime.)      Lancets (ACCU-CHEK SOFT TOUCH) lancets Use as instructed 100 each 12  LINZESS  145 MCG CAPS capsule Take 145 mcg by mouth daily.      lisinopril  (ZESTRIL ) 40 MG tablet Take 40 mg by mouth daily.      SYNTHROID  112 MCG tablet Take 112 mcg by mouth daily.       Patient Stressors:    Patient Strengths:    Treatment Modalities: Medication Management, Group therapy, Case management,  1 to 1 session with clinician, Psychoeducation, Recreational therapy.   Physician Treatment Plan for Primary Diagnosis: Mood disorder (HCC) Long Term Goal(s):     Short Term Goals: Ability to disclose and discuss suicidal ideas Ability to identify and develop effective coping behaviors will improve  Medication Management: Evaluate patient's response, side effects, and tolerance of medication regimen.  Therapeutic Interventions: 1 to 1 sessions, Unit Group sessions and Medication administration.  Evaluation of  Outcomes: Not Progressing  Physician Treatment Plan for Secondary Diagnosis: Principal Problem:   Mood disorder (HCC) Active Problems:   History of CVA (cerebrovascular accident)   Suicidal ideation  Long Term Goal(s):     Short Term Goals: Ability to disclose and discuss suicidal ideas Ability to identify and develop effective coping behaviors will improve     Medication Management: Evaluate patient's response, side effects, and tolerance of medication regimen.  Therapeutic Interventions: 1 to 1 sessions, Unit Group sessions and Medication administration.  Evaluation of Outcomes: Not Progressing   RN Treatment Plan for Primary Diagnosis: Mood disorder (HCC) Long Term Goal(s): Knowledge of disease and therapeutic regimen to maintain health will improve  Short Term Goals: Ability to remain free from injury will improve, Ability to verbalize frustration and anger appropriately will improve, Ability to demonstrate self-control, Ability to participate in decision making will improve, Ability to verbalize feelings will improve, Ability to disclose and discuss suicidal ideas, Ability to identify and develop effective coping behaviors will improve, and Compliance with prescribed medications will improve  Medication Management: RN will administer medications as ordered by provider, will assess and evaluate patient's response and provide education to patient for prescribed medication. RN will report any adverse and/or side effects to prescribing provider.  Therapeutic Interventions: 1 on 1 counseling sessions, Psychoeducation, Medication administration, Evaluate responses to treatment, Monitor vital signs and CBGs as ordered, Perform/monitor CIWA, COWS, AIMS and Fall Risk screenings as ordered, Perform wound care treatments as ordered.  Evaluation of Outcomes: Not Progressing   LCSW Treatment Plan for Primary Diagnosis: Mood disorder (HCC) Long Term Goal(s): Safe transition to appropriate  next level of care at discharge, Engage patient in therapeutic group addressing interpersonal concerns.  Short Term Goals: Engage patient in aftercare planning with referrals and resources, Increase social support, Increase ability to appropriately verbalize feelings, Increase emotional regulation, Facilitate acceptance of mental health diagnosis and concerns, Facilitate patient progression through stages of change regarding substance use diagnoses and concerns, Identify triggers associated with mental health/substance abuse issues, and Increase skills for wellness and recovery  Therapeutic Interventions: Assess for all discharge needs, 1 to 1 time with Social worker, Explore available resources and support systems, Assess for adequacy in community support network, Educate family and significant other(s) on suicide prevention, Complete Psychosocial Assessment, Interpersonal group therapy.  Evaluation of Outcomes: Not Progressing   Progress in Treatment: Attending groups: No. Participating in groups: No. Taking medication as prescribed: Yes. Toleration medication: Yes. Family/Significant other contact made: No, will contact:  patient declined consents due to lack of supports Patient understands diagnosis: Yes. Discussing patient identified problems/goals with staff: Yes. Medical problems stabilized or resolved: Yes.  Denies suicidal/homicidal ideation: Yes. Issues/concerns per patient self-inventory: No. Other: n/a  New problem(s) identified: No, Describe:  none  New Short Term/Long Term Goal(s): medication stabilization, elimination of SI thoughts, development of comprehensive mental wellness plan.   Patient Goals: I don't have no goal  Discharge Plan or Barriers: Patient recently admitted. CSW will continue to follow and assess for appropriate referrals and possible discharge planning.    Reason for Continuation of Hospitalization: Anxiety Depression Medication stabilization Other;  describe mood stabilization, discharge planning   Estimated Length of Stay: 5-7 DAYS  Last 3 [place] Suicide Severity Risk Score: Flowsheet Row Ad[facility] (Current) [facility] from 06/15/2024 in [facility] ED from 05/02/2021 in [facility] Department at [facility]  C-SSRS RISK CATEGORY High Risk High Risk Error: Question 2 not populated    Last [facility] 2/9 Scores:    05/10/2019   10:24 AM 02/15/2019    3:53 PM 01/22/2019    9:21 AM  Depression screen PHQ 2/9  Decreased Interest 0 1 2  Down, Depressed, Hopeless 2 1 2   PHQ - 2 Score 2 2 4   Altered sleeping 1 1 2   Tired, decreased energy 1 1 2   Change in appetite 0 1 1  Feeling bad or failure about yourself  0 2 2  Trouble concentrating 1 1 3   Moving slowly or fidgety/restless 0 1 3  Suicidal thoughts 0 1 2  PHQ-9 Score 5 10 19   Difficult doing work/chores  Somewhat difficult     Scribe for Treatment Team: Zelda LOISE [employer], LCSW 06/17/2024 2:15 PM

## 2024-06-17 NOTE — Progress Notes (Signed)
   06/17/24 2200  Psych Admission Type (Psych Patients Only)  Admission Status Involuntary  Psychosocial Assessment  Patient Complaints Depression  Eye Contact Fair  Facial Expression Flat  Affect Flat  Speech Slow  Interaction Guarded  Motor Activity Slow  Appearance/Hygiene In hospital gown  Behavior Characteristics Cooperative  Mood Depressed  Thought Process  Coherency WDL  Content WDL  Delusions None reported or observed  Perception WDL  Hallucination None reported or observed  Judgment Poor  Confusion None  Danger to Self  Current suicidal ideation? Passive  Agreement Not to Harm Self Yes  Description of Agreement verbal  Danger to Others  Danger to Others None reported or observed

## 2024-06-17 NOTE — [facility] Group Notes (Signed)

## 2024-06-18 LAB — GLUCOSE, CAPILLARY
Glucose-Capillary: 170 mg/dL — ABNORMAL HIGH (ref 70–99)
Glucose-Capillary: 192 mg/dL — ABNORMAL HIGH (ref 70–99)
Glucose-Capillary: 165 mg/dL — ABNORMAL HIGH (ref 70–99)
Glucose-Capillary: 259 mg/dL — ABNORMAL HIGH (ref 70–99)

## 2024-06-18 MED ORDER — ASPIRIN 325 MG PO TBEC
325.0000 mg | DELAYED_RELEASE_TABLET | Freq: Every day | ORAL | Status: DC
  Administered 2024-06-18 – 2024-06-24 (×7): 325 mg via ORAL
  Filled 2024-06-18 (×7): qty 1

## 2024-06-18 MED ORDER — INSULIN GLARGINE-YFGN 100 UNIT/ML ~~LOC~~ SOLN
20.0000 [IU] | Freq: Every day | SUBCUTANEOUS | Status: DC
  Administered 2024-06-18: 20 [IU] via SUBCUTANEOUS

## 2024-06-18 MED ORDER — LEVOTHYROXINE SODIUM 112 MCG PO TABS
112.0000 ug | ORAL_TABLET | Freq: Every day | ORAL | Status: DC
  Administered 2024-06-18 – 2024-06-24 (×7): 112 ug via ORAL
  Filled 2024-06-18 (×7): qty 1

## 2024-06-18 NOTE — Group Note (Addendum)
 Recreation Therapy Group Note   Group Topic:Communication  Group Date: 06/18/2024 Start Time: 9057 End Time: 1025 Facilitators: Kyaira Trantham-McCall, LRT,CTRS Location: 300 Hall Dayroom   Group Topic: Communication, Problem Solving   Goal Area(s) Addresses:  Patient will effectively listen to complete activity.  Patient will identify communication skills used to make activity successful.  Patient will identify how skills used during activity can be used to reach post d/c goals.    Behavioral Response:    Intervention: Building surveyor Activity - Geometric pattern cards, pencils, blank paper    Activity: Geometric Drawings.  Three volunteers from the peer group will be shown an abstract picture with a particular arrangement of geometrical shapes.  Each round, one 'speaker' will describe the pattern, as accurately as possible without revealing the image to the group.  The remaining group members will listen and draw the picture to reflect how it is described to them. Patients with the role of 'listener' cannot ask clarifying questions but, may request that the speaker repeat a direction. Once the drawings are complete, the presenter will show the rest of the group the picture and compare how close each person came to drawing the picture. LRT will facilitate a post-activity discussion regarding effective communication and the importance of planning, listening, and asking for clarification in daily interactions with others.  Education: Environmental consultant, Active listening, Support systems, Discharge planning  Education Outcome: Acknowledges understanding/In group clarification offered/Needs additional education.    Affect/Mood: N/A   Participation Level: Did not attend    Clinical Observations/Individualized Feedback:     Plan: Continue to engage patient in RT group sessions 2-3x/week.   Coolidge Gossard-McCall, LRT,CTRS 06/18/2024 12:40 PM

## 2024-06-18 NOTE — Plan of Care (Signed)
   Problem: Health Behavior/Discharge Planning: Goal: Compliance with treatment plan for underlying cause of condition will improve Outcome: Progressing   Problem: Safety: Goal: Periods of time without injury will increase Outcome: Progressing

## 2024-06-18 NOTE — Plan of Care (Signed)
   Problem: Education: Goal: Knowledge of [facility] Education information/materials will improve Outcome: Progressing Goal: Emotional status will improve Outcome: Progressing Goal: Mental status will improve Outcome: Progressing Goal: Verbalization of understanding the information provided will improve Outcome: Progressing

## 2024-06-18 NOTE — Group Note (Signed)
 LCSW Group Therapy Note   Group Date: 06/18/2024 Start Time: 1100 End Time: 1200   Participation:  did not attend  Type of Therapy:  Group Therapy  Topic:  Healing Hearts: A Safe Space for Grief  Objective:  The objective of this group, Healing Hearts: A Safe Space for Grief, is to create a compassionate environment where participants can process their grief, explore different stages of grief, and discover ways to honor their loved ones through personal rituals.  3 Goals: Provide a safe and supportive space where participants feel comfortable sharing their feelings and experiences of grief without judgment. Educate participants about the stages of grief and emphasize that there is no right way to grieve or a fixed timeline for healing. Introduce the concept of rituals as a means to process grief, allowing individuals to honor their loved ones in a personal and meaningful way.  Summary:  In Healing Hearts: A Safe Space for Grief, we explored the unique and personal journey of grief, emphasizing that everyone experiences it differently. We discussed the five stages of grief (denial, anger, bargaining, depression, and acceptance), with the understanding that grief is not linear. Rituals were introduced as a way to help cope with loss, offering comfort and connection through meaningful actions such as lighting candles or taking memory walks. Participants were encouraged to express their emotions, focus on self-care, and reflect on moments of gratitude for their loved ones, recognizing that healing is a process and there is no timeline for grief.   Ashley Watts, LCSWA 06/18/2024  5:25 PM
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
## 2024-06-18 NOTE — Group Note (Signed)
 Date:  06/18/2024 Time:  9:43 PM  Group Topic/Focus:  Wrap-Up Group:   The focus of this group is to help patients review their daily goal of treatment and discuss progress on daily workbooks.    Participation Level:  None  Participation Quality:  Appropriate  Affect:  Appropriate  Cognitive:  Alert  Insight: None  Engagement in Group:  None  Modes of Intervention:  Discussion and Education  Additional Comments:  Pt attended wrap up group up group, but opted out of participating.  Ashley Watts 06/18/2024, 9:43 PM

## 2024-06-18 NOTE — Progress Notes (Addendum)
 D. Pt presents with a sad affect, depressed mood- calm, cooperative behavior. Pt reported poor sleep last night, described her appetite and concentration as 'poor', and energy level as 'low'. Per pt's self inventory, pt rated her depression,hopelessness and anxiety a 7/8/7, respectively. Pt did not attend groups today- observed minimal interaction with others. Pt currently denies SI/HI and AVH and doesn't appear to be responding to internal stimuli. A. Labs and vitals monitored. Pt given and educated on medications. Pt supported emotionally and encouraged to express concerns and ask questions.   R. Pt remains safe with 15 minute checks. Will continue POC.    06/18/24 1300  Psych Admission Type (Psych Patients Only)  Admission Status Involuntary  Psychosocial Assessment  Patient Complaints Anxiety;Depression  Eye Contact Fair  Facial Expression Anxious  Affect Anxious  Speech Slow  Interaction Guarded  Motor Activity Slow  Appearance/Hygiene Unremarkable  Behavior Characteristics Cooperative  Mood Depressed;Anxious  Thought Process  Coherency WDL  Content WDL  Delusions None reported or observed  Perception WDL  Hallucination None reported or observed  Judgment Poor  Confusion None  Danger to Self  Current suicidal ideation? Denies  Danger to Others  Danger to Others None reported or observed

## 2024-06-18 NOTE — Plan of Care (Cosign Needed)
 I attempted to call her son [name] at #[phone] however this number is no longer in service.
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
## 2024-06-18 NOTE — Progress Notes (Signed)
 Pt did not attend group.

## 2024-06-19 LAB — GLUCOSE, CAPILLARY
Glucose-Capillary: 166 mg/dL — ABNORMAL HIGH (ref 70–99)
Glucose-Capillary: 181 mg/dL — ABNORMAL HIGH (ref 70–99)
Glucose-Capillary: 213 mg/dL — ABNORMAL HIGH (ref 70–99)
Glucose-Capillary: 236 mg/dL — ABNORMAL HIGH (ref 70–99)

## 2024-06-19 MED ORDER — SENNOSIDES-DOCUSATE SODIUM 8.6-50 MG PO TABS
1.0000 | ORAL_TABLET | Freq: Once | ORAL | Status: AC
  Administered 2024-06-19: 1 via ORAL
  Filled 2024-06-19: qty 1

## 2024-06-19 MED ORDER — INSULIN GLARGINE-YFGN 100 UNIT/ML ~~LOC~~ SOLN
30.0000 [IU] | Freq: Every day | SUBCUTANEOUS | Status: DC
  Administered 2024-06-19: 30 [IU] via SUBCUTANEOUS

## 2024-06-19 NOTE — Plan of Care (Signed)
   Problem: Education: Goal: Emotional status will improve Outcome: Progressing Goal: Mental status will improve Outcome: Progressing   Problem: Activity: Goal: Interest or engagement in activities will improve Outcome: Progressing

## 2024-06-19 NOTE — [facility] Group Notes (Signed)
 Spirituality group facilitated by Elia Rockie Sofia, B[facility].  Group Description: Group focused on topic of hope. Patients participated in facilitated discussion around topic, connecting with one another around experiences and definitions for hope. Group members engaged with visual explorer photos, reflecting on what hope looks like for them today. Group engaged in discussion around how their definitions of hope are present today in hospital.  Modalities: Psycho-social ed, Adlerian, Narrative, MI  Patient Progress: Astin attended group.  She did not participate verbally but she was attentive and her body language demonstrated engagement in the conversation.

## 2024-06-19 NOTE — Progress Notes (Signed)
   06/18/24 2058  Psych Admission Type (Psych Patients Only)  Admission Status Involuntary  Psychosocial Assessment  Patient Complaints Anxiety;Nervousness;Shakiness  Eye Contact Fair  Facial Expression Anxious  Affect Anxious  Speech Slow  Interaction Cautious  Motor Activity Slow  Appearance/Hygiene Unremarkable  Behavior Characteristics Cooperative  Mood Depressed;Anxious  Thought Process  Coherency WDL  Content WDL  Delusions None reported or observed  Perception WDL  Hallucination None reported or observed  Judgment Poor  Confusion None  Danger to Self  Current suicidal ideation? Denies  Danger to Others  Danger to Others None reported or observed
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
## 2024-06-19 NOTE — Progress Notes (Signed)
 Chaplains received a consult to provide support to Ashley Watts. I attempted to see her this afternoon, but she was asleep.  Will attempt at a later time.

## 2024-06-19 NOTE — Group Note (Signed)
 Recreation Therapy Group Note   Group Topic:Team Building  Group Date: 06/19/2024 Start Time: 9062 End Time: 1015 Facilitators: Laroy Mustard-McCall, LRT,CTRS Location: [address] Dayroom   Group Topic: Communication, Team Building, Problem Solving  Goal Area(s) Addresses:  Patient will effectively work with peer towards shared goal.  Patient will identify skills used to make activity successful.  Patient will share challenges and verbalize solution-driven approaches used. Patient will identify how skills used during activity can be used to reach post d/c goals.   Behavioral Response:   Intervention: STEM Activity   Activity: [employer]. Patients were provided the following materials: 5 drinking straws, 5 rubber bands, 5 paper clips, 2 index cards and 2 drinking cups. Using the provided materials patients were asked to build a launching mechanism to launch a ping pong ball across the room, approximately 10 feet. Patients were divided into teams of 3-5. Instructions required all materials be incorporated into the device, functionality of items left to the peer group's discretion.  Education: Pharmacist, community, Scientist, physiological, Air cabin crew, Building control surveyor.   Education Outcome: Acknowledges education/In group clarification offered/Needs additional education.    Affect/Mood: N/A   Participation Level: Did not attend    Clinical Observations/Individualized Feedback:    Plan: Continue to engage patient in RT group sessions 2-3x/week.   Verlisa Vara-McCall, LRT,CTRS 06/19/2024 12:44 PM

## 2024-06-19 NOTE — Plan of Care (Signed)
   Problem: Education: Goal: Emotional status will improve Outcome: Progressing Goal: Mental status will improve Outcome: Progressing   Problem: Activity: Goal: Sleeping patterns will improve Outcome: Progressing

## 2024-06-19 NOTE — Progress Notes (Signed)
(  Sleep Hours) - 8.25  (Any PRNs that were needed, meds refused, or side effects to meds)- Vistaril  25 mg, Trazodone  50 mg  (Any disturbances and when (visitation, over night)- N/A  (Concerns raised by the patient)- confusion  (SI/HI/AVH)- denies

## 2024-06-19 NOTE — Progress Notes (Signed)
   06/19/24 0900  Psych Admission Type (Psych Patients Only)  Admission Status Involuntary  Psychosocial Assessment  Patient Complaints Depression;Anxiety  Eye Contact Fair  Facial Expression Anxious;Sad  Affect Anxious  Speech Slow  Interaction Cautious  Motor Activity Slow  Appearance/Hygiene Unremarkable  Behavior Characteristics Cooperative  Mood Depressed;Anxious  Thought Process  Coherency WDL  Content WDL  Delusions None reported or observed  Perception WDL  Hallucination None reported or observed  Judgment Poor  Confusion None  Danger to Self  Current suicidal ideation? Denies  Agreement Not to Harm Self Yes  Description of Agreement verbal  Danger to Others  Danger to Others None reported or observed

## 2024-06-19 NOTE — Progress Notes (Signed)
 Ashley Watts did not attend NA wrap up group

## 2024-06-19 NOTE — Progress Notes (Signed)
 (  Sleep Hours) - 7.5 hours  (Any PRNs that were needed, meds refused, or side effects to meds)- 50 mg Trazodone , 650 mg Tylenol , 25 mg Hydroxyzine .  (Any disturbances and when (visitation, over night)- None  (Concerns raised by the patient)- Patient complained of being anxious, nervous, and shaky; Patient reported neck pain 5/10.  Patient also complained of not sleeping well.  Administered PRNs.  Patient's greatest concern was about a place to stay, reporting, Dr. Glenwood he would help me find a place.   (SI/HI/AVH)- Denies

## 2024-06-19 NOTE — Plan of Care (Signed)
  Problem: Education: Goal: Emotional status will improve Outcome: Progressing Goal: Verbalization of understanding the information provided will improve Outcome: Progressing   Problem: Coping: Goal: Ability to verbalize frustrations and anger appropriately will improve Outcome: Progressing   Problem: Health Behavior/Discharge Planning: Goal: Compliance with treatment plan for underlying cause of condition will improve Outcome: Progressing

## 2024-06-19 NOTE — Progress Notes (Deleted)
 Adult Psychoeducational Group Note  Date:  06/19/2024 Time:  8:18 PM  Group Topic/Focus:  Wrap-Up Group:   The focus of this group is to help patients review their daily goal of treatment and discuss progress on daily workbooks.  Participation Level:  Active  Participation Quality:  Appropriate  Affect:  Appropriate  Cognitive:  Appropriate  Insight: Appropriate  Engagement in Group:  Engaged  Modes of Intervention:  Discussion  Additional Comments:  Madline attend NA wrap up group.  Lang Drilling Long 06/19/2024, 8:18 PM

## 2024-06-20 LAB — GLUCOSE, CAPILLARY
Glucose-Capillary: 155 mg/dL — ABNORMAL HIGH (ref 70–99)
Glucose-Capillary: 201 mg/dL — ABNORMAL HIGH (ref 70–99)
Glucose-Capillary: 234 mg/dL — ABNORMAL HIGH (ref 70–99)
Glucose-Capillary: 359 mg/dL — ABNORMAL HIGH (ref 70–99)

## 2024-06-20 MED ORDER — MAGNESIUM HYDROXIDE 400 MG/5ML PO SUSP
30.0000 mL | Freq: Every day | ORAL | Status: AC
  Administered 2024-06-20: 30 mL via ORAL
  Filled 2024-06-20: qty 30

## 2024-06-20 MED ORDER — SERTRALINE HCL 25 MG PO TABS
75.0000 mg | ORAL_TABLET | Freq: Every day | ORAL | Status: DC
  Administered 2024-06-21: 75 mg via ORAL
  Filled 2024-06-20: qty 3

## 2024-06-20 MED ORDER — LISINOPRIL 20 MG PO TABS
40.0000 mg | ORAL_TABLET | Freq: Every day | ORAL | Status: DC
  Administered 2024-06-20 – 2024-06-24 (×5): 40 mg via ORAL
  Filled 2024-06-20 (×5): qty 2

## 2024-06-20 MED ORDER — INSULIN GLARGINE-YFGN 100 UNIT/ML ~~LOC~~ SOLN
40.0000 [IU] | Freq: Every day | SUBCUTANEOUS | Status: DC
  Administered 2024-06-20 – 2024-06-23 (×4): 40 [IU] via SUBCUTANEOUS

## 2024-06-20 NOTE — Care Management Important Message (Signed)
 Medicare IM printed and given to social work to give to the patient prior to discharge.

## 2024-06-20 NOTE — Plan of Care (Signed)
  Problem: Education: Goal: Emotional status will improve Outcome: Progressing Goal: Verbalization of understanding the information provided will improve Outcome: Progressing   Problem: Activity: Goal: Sleeping patterns will improve Outcome: Progressing   Problem: Coping: Goal: Ability to demonstrate self-control will improve Outcome: Progressing   Problem: Physical Regulation: Goal: Ability to maintain clinical measurements within normal limits will improve Outcome: Progressing

## 2024-06-20 NOTE — Progress Notes (Signed)
   06/20/24 0800  Psych Admission Type (Psych Patients Only)  Admission Status Involuntary  Psychosocial Assessment  Patient Complaints Anxiety;Depression  Eye Contact Fair  Facial Expression Sad;Anxious  Affect Anxious;Sad  Speech Slow  Interaction Cautious  Motor Activity Slow  Appearance/Hygiene Unremarkable  Behavior Characteristics Cooperative  Mood Depressed;Anxious  Thought Process  Coherency WDL  Content WDL  Delusions None reported or observed  Perception WDL  Hallucination None reported or observed  Judgment Limited  Confusion None  Danger to Self  Current suicidal ideation? Denies  Agreement Not to Harm Self Yes  Description of Agreement verbal  Danger to Others  Danger to Others None reported or observed

## 2024-06-20 NOTE — Group Note (Signed)
 Date:  06/20/2024 Time:  11:38 PM  Group Topic/Focus:  Wrap-Up Group:   The focus of this group is to help patients review their daily goal of treatment and discuss progress on daily workbooks.    Participation Level:  Active  Participation Quality:  Appropriate  Affect:  Depressed  Cognitive:  Appropriate  Insight: Good  Engagement in Group:  Engaged  Modes of Intervention:  Support  Additional Comments:  Patient attended group and was attentive. Patient described her day as depressing. She believed she had no wins of the day. Patient shared that she did not feel encouraged. Patient described feeling depressed due to conflict with her son. Though her story was off topic, her words were valued by the group and appeared to uplift the masses. I recommend the morning MHTs check in with her throughout the day, sharing positive energy and messages with her!  [name] 06/20/2024, 11:38 PM

## 2024-06-20 NOTE — Progress Notes (Signed)
 Adult Psychoeducational Group Note  Date:  06/20/2024 Time:  1:02 PM  Group Topic/Focus:  Goals Group:   The focus of this group is to help patients establish daily goals to achieve during treatment and discuss how the patient can incorporate goal setting into their daily lives to aide in recovery.  Participation Level:  Active  Participation Quality:  Appropriate  Affect:  Appropriate  Cognitive:  Appropriate  Insight: Appropriate  Engagement in Group:  Engaged  Modes of Intervention:  Discussion  Additional Comments:  Pt stated she is doing okay. Pt did not have a goal for the day.  Daine Pillar D 06/20/2024, 1:02 PM

## 2024-06-20 NOTE — Progress Notes (Signed)
   06/20/24 2300  Psych Admission Type (Psych Patients Only)  Admission Status Involuntary  Psychosocial Assessment  Patient Complaints Depression  Eye Contact Fair  Facial Expression Sad  Affect Sad  Speech Slow  Interaction Cautious  Motor Activity Slow  Appearance/Hygiene Unremarkable  Behavior Characteristics Cooperative  Mood Depressed  Thought Process  Coherency WDL  Content WDL  Delusions None reported or observed  Perception WDL  Hallucination None reported or observed  Judgment Limited  Confusion None  Danger to Self  Current suicidal ideation? Denies  Agreement Not to Harm Self Yes  Description of Agreement Verbal  Danger to Others  Danger to Others None reported or observed
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
## 2024-06-20 NOTE — Group Note (Signed)
 LCSW Group Therapy Note   Group Date: 06/20/2024 Start Time: 1100 End Time: 1200   Participation:  patient was present  Type of Therapy:  Group Therapy  Topic:   Healing Flames: Navigating Anger with Compassion  Objective:  Foster self-awareness and promote compassion toward oneself and others when dealing with anger.  Goals:  Help participants understand the underlying emotions and needs fueling anger. Provide coping strategies for healthier emotional expression and anger management.  Summary: This session explored anger as a volcano - an explosion driven by deeper feelings and unmet needs. Participants learned to identify anger triggers and underlying emotions, then practiced coping strategies like deep breathing, physical activity, and journaling. The group discussed healthy ways to manage anger before it escalates, using both personal reflection and shared experiences.  Therapeutic Modalities: Cognitive Behavioral Therapy (CBT): Challenging thoughts that fuel anger. Mindfulness: Increasing awareness of emotions and sensations.   Tyeesha Riker O Jermey Closs, LCSWA 06/20/2024  6:10 PM

## 2024-06-21 ENCOUNTER — Encounter (HOSPITAL_COMMUNITY): Payer: Self-pay

## 2024-06-21 LAB — GLUCOSE, CAPILLARY
Glucose-Capillary: 184 mg/dL — ABNORMAL HIGH (ref 70–99)
Glucose-Capillary: 166 mg/dL — ABNORMAL HIGH (ref 70–99)
Glucose-Capillary: 256 mg/dL — ABNORMAL HIGH (ref 70–99)
Glucose-Capillary: 202 mg/dL — ABNORMAL HIGH (ref 70–99)

## 2024-06-21 MED ORDER — SERTRALINE HCL 100 MG PO TABS
100.0000 mg | ORAL_TABLET | Freq: Every day | ORAL | Status: DC
  Administered 2024-06-22 – 2024-06-24 (×3): 100 mg via ORAL
  Filled 2024-06-21 (×3): qty 1

## 2024-06-21 NOTE — Group Note (Signed)
 Recreation Therapy Group Note   Group Topic:Leisure Education  Group Date: 06/21/2024 Start Time: 0930 End Time: 0957 Facilitators: Melodye Swor-McCall, LRT,CTRS Location: [address] Dayroom   Group Topic: Leisure Education    Goal Area(s) Addresses:  Patient will successfully identify benefits of leisure participation. Patient will successfully identify ways to access leisure activities. Patient will identify leisure activities available based on their geographical location in proximity to their home.   Behavioral Response:   Intervention: Poster Making   Activity: LRT and patients discussed leisure and its importance in a persons development. Patients had the option of working in groups or individually. Groups were given a poster board and writing utensils. Patients were to then create and advertisement that highlighted what leisure is, who can benefit and examples of leisure activities. Patients were to create their PSA in the form of a print ad or commercial. Patients presented their PSAs at the end of group.    Education:  Leisure Education, Publishing copy Outcome: Acknowledges education   Affect/Mood: N/A   Participation Level: Did not attend    Clinical Observations/Individualized Feedback:     Plan: Continue to engage patient in RT group sessions 2-3x/week.   Cem Kosman-McCall, LRT,CTRS 06/21/2024 12:00 PM

## 2024-06-21 NOTE — Plan of Care (Signed)
   Problem: Education: Goal: Emotional status will improve Outcome: Progressing Goal: Mental status will improve Outcome: Progressing

## 2024-06-21 NOTE — Group Note (Signed)
 Date:  06/21/2024 Time:  9:41 AM  Group Topic/Focus:  Goals Group/Stages of change:   The focus of this group is to help patients establish daily goals to achieve during treatment and discuss how the patient can incorporate goal setting into their daily lives to aide in recovery. Also discussed  the stages of change they were in and how ready for change they are and what possible steps they could take to changing in the future     Participation Level:  Active  Participation Quality:  Appropriate  Affect:  Appropriate  Cognitive:  Appropriate  Insight: Appropriate  Engagement in Group:  Engaged  Modes of Intervention:  Activity and Discussion  Additional Comments:    Beatris ONEIDA Hasten 06/21/2024, 9:41 AM

## 2024-06-21 NOTE — Progress Notes (Signed)
   06/21/24 1528  Psych Admission Type (Psych Patients Only)  Admission Status Involuntary  Psychosocial Assessment  Patient Complaints Depression  Eye Contact Fair  Facial Expression Anxious  Affect Anxious  Speech Soft;Slow  Interaction Avoidant;Cautious  Motor Activity Slow  Appearance/Hygiene Unremarkable  Behavior Characteristics Cooperative  Mood Depressed;Anxious  Aggressive Behavior  Effect No apparent injury  Thought Process  Coherency WDL  Content WDL  Delusions None reported or observed  Perception WDL  Hallucination None reported or observed  Judgment Limited  Confusion None  Danger to Self  Current suicidal ideation? Denies  Self-Injurious Behavior No self-injurious ideation or behavior indicators observed or expressed   Agreement Not to Harm Self Yes  Description of Agreement Verbal  Danger to Others  Danger to Others None reported or observed

## 2024-06-21 NOTE — Group Note (Signed)
 Date:  06/21/2024 Time:  2:12 PM  Group Topic/Focus:  Questionnaire Ball The purpose of this group is to pass the questionnaire ball around the room and do the the soothing activities for 10 seconds.     Participation Level:  Did Not Attend  Ashley Watts 06/21/2024, 2:12 PM

## 2024-06-21 NOTE — Progress Notes (Signed)
   06/21/24 0532  15 Minute Checks  Location Bedroom  Visual Appearance Calm  Behavior Sleeping  Sleep (Behavioral Health Patients Only)  Calculate sleep? (Click Yes once per 24 hr at 0600 safety check) Yes  Documented sleep last 24 hours 8

## 2024-06-21 NOTE — B[facility] Group Notes (Signed)
 Psychoeducational Group Note  Date:  06/21/2024 Time:  2000  Group Topic/Focus:  Alcoholics Anonymous Meeting  Participation Level: Did Not Attend  Participation Quality:  Not Applicable  Affect:  Not Applicable  Cognitive:  Not Applicable  Insight:  Not Applicable  Engagement in Group: Not Applicable  Additional Comments:  Did not attend.   Lenora Manuelita RAMAN 06/21/2024, 10:55 PM
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
## 2024-06-21 NOTE — BH IP Treatment Plan (Signed)
 Interdisciplinary Treatment and Diagnostic Plan Update  06/21/2024 Time of Session: 12:10 PM - UPDATE Ashley Watts MRN: [id number]  Principal Diagnosis: Mood disorder (HCC)  Secondary Diagnoses: Principal Problem:   Mood disorder (HCC) Active Problems:   History of CVA (cerebrovascular accident)   Suicidal ideation   Current Medications:  Current Facility-Administered Medications  Medication Dose Route Frequency Provider Last Rate Last Admin   acetaminophen  (TYLENOL ) tablet 650 mg  650 mg Oral Q6H PRN Brent, Amanda C, NP   650 mg at 06/21/24 0753   alum & mag hydroxide-simeth (MAALOX/MYLANTA) 200-200-20 MG/5ML suspension 30 mL  30 mL Oral Q4H PRN Brent, Amanda C, NP       aspirin  EC tablet 325 mg  325 mg Oral Daily Pashayan, Alexander S, DO   325 mg at 06/21/24 9246   hydrOXYzine  (ATARAX ) tablet 25 mg  25 mg Oral TID PRN Brent, Amanda C, NP   25 mg at 06/20/24 2108   insulin  aspart (novoLOG ) injection 0-15 Units  0-15 Units Subcutaneous TID WC Pashayan, Alexander S, DO   8 Units at 06/21/24 1808   insulin  aspart (novoLOG ) injection 0-5 Units  0-5 Units Subcutaneous QHS Pashayan, Alexander S, DO   5 Units at 06/20/24 2106   insulin  glargine-yfgn (SEMGLEE ) injection 40 Units  40 Units Subcutaneous QHS Faunce, Alina, DO   40 Units at 06/20/24 2107   levothyroxine  (SYNTHROID ) tablet 112 mcg  112 mcg Oral Daily Pashayan, Alexander S, DO   112 mcg at 06/21/24 9380   lisinopril  (ZESTRIL ) tablet 40 mg  40 mg Oral Daily Pashayan, Alexander S, DO   40 mg at 06/21/24 9246   magnesium  hydroxide (MILK OF MAGNESIA) suspension 30 mL  30 mL Oral Daily PRN Brent, Amanda C, NP       OLANZapine  (ZYPREXA ) injection 10 mg  10 mg Intramuscular TID PRN Brent, Amanda C, NP       OLANZapine  (ZYPREXA ) injection 5 mg  5 mg Intramuscular TID PRN Brent, Amanda C, NP       OLANZapine  zydis (ZYPREXA ) disintegrating tablet 5 mg  5 mg Oral TID PRN Brent, Amanda C, NP       ondansetron  (ZOFRAN -ODT) disintegrating  tablet 4 mg  4 mg Oral Q4H PRN Faunce, Alina, DO       [START ON 06/22/2024] sertraline  (ZOLOFT ) tablet 100 mg  100 mg Oral Daily Faunce, Alina, DO       traZODone  (DESYREL ) tablet 50 mg  50 mg Oral QHS PRN Brent, Amanda C, NP   50 mg at 06/20/24 2108   PTA Medications: Medications Prior to Admission  Medication Sig Dispense Refill Last Dose/Taking   aspirin  EC 81 MG EC tablet Take 1 tablet (81 mg total) by mouth daily. (Patient taking differently: Take 325 mg by mouth daily.) 30 tablet 0    blood glucose meter kit and supplies Dispense based on patient and insurance preference. Use up to four times daily as directed. (FOR ICD-10 E10.9, E11.9). 1 each 0    Blood Glucose Monitoring Suppl (ONETOUCH VERIO) w/Device KIT 1 kit by Does not apply route 4 (four) times daily. 1 kit 0    carvedilol  (COREG ) 6.25 MG tablet Take 1 tablet (6.25 mg total) by mouth 2 (two) times daily with a meal.      clopidogrel  (PLAVIX ) 75 MG tablet Take 75 mg by mouth daily.      dapagliflozin  propanediol (FARXIGA ) 10 MG TABS tablet Take 10 mg by mouth daily. (Patient not taking: Reported  on 06/15/2024)      furosemide  (LASIX ) 40 MG tablet Take 1 tablet (40 mg total) by mouth daily. 30 tablet 3    GLUCOPHAGE  1000 MG tablet Take 1 tablet (1,000 mg total) by mouth 2 (two) times daily with a meal. 180 tablet 1    glucose blood (ACCU-CHEK AVIVA) test strip Check glucose up to QID Dx E11.9 400 each 3    HYDROcodone-acetaminophen  (NORCO/VICODIN) 5-325 MG tablet Take 1 tablet by mouth 2 (two) times daily as needed for moderate pain (pain score 4-6).      insulin  glargine (LANTUS  SOLOSTAR) 100 UNIT/ML Solostar Pen Inject 20 Units into the skin at bedtime. (Patient taking differently: Inject 40 Units into the skin at bedtime.)      Lancets (ACCU-CHEK SOFT TOUCH) lancets Use as instructed 100 each 12    LINZESS  145 MCG CAPS capsule Take 145 mcg by mouth daily.      lisinopril  (ZESTRIL ) 40 MG tablet Take 40 mg by mouth daily.       SYNTHROID  112 MCG tablet Take 112 mcg by mouth daily.       Patient Stressors:    Patient Strengths:    Treatment Modalities: Medication Management, Group therapy, Case management,  1 to 1 session with clinician, Psychoeducation, Recreational therapy.   Physician Treatment Plan for Primary Diagnosis: Mood disorder (HCC) Long Term Goal(s):     Short Term Goals: Ability to disclose and discuss suicidal ideas Ability to identify and develop effective coping behaviors will improve  Medication Management: Evaluate patient's response, side effects, and tolerance of medication regimen.  Therapeutic Interventions: 1 to 1 sessions, Unit Group sessions and Medication administration.  Evaluation of Outcomes: Progressing  Physician Treatment Plan for Secondary Diagnosis: Principal Problem:   Mood disorder (HCC) Active Problems:   History of CVA (cerebrovascular accident)   Suicidal ideation  Long Term Goal(s):     Short Term Goals: Ability to disclose and discuss suicidal ideas Ability to identify and develop effective coping behaviors will improve     Medication Management: Evaluate patient's response, side effects, and tolerance of medication regimen.  Therapeutic Interventions: 1 to 1 sessions, Unit Group sessions and Medication administration.  Evaluation of Outcomes: Progressing   RN Treatment Plan for Primary Diagnosis: Mood disorder (HCC) Long Term Goal(s): Knowledge of disease and therapeutic regimen to maintain health will improve  Short Term Goals: Ability to remain free from injury will improve, Ability to verbalize frustration and anger appropriately will improve, Ability to verbalize feelings will improve, and Ability to disclose and discuss suicidal ideas  Medication Management: RN will administer medications as ordered by provider, will assess and evaluate patient's response and provide education to patient for prescribed medication. RN will report any adverse and/or  side effects to prescribing provider.  Therapeutic Interventions: 1 on 1 counseling sessions, Psychoeducation, Medication administration, Evaluate responses to treatment, Monitor vital signs and CBGs as ordered, Perform/monitor CIWA, COWS, AIMS and Fall Risk screenings as ordered, Perform wound care treatments as ordered.  Evaluation of Outcomes: Progressing   LCSW Treatment Plan for Primary Diagnosis: Mood disorder (HCC) Long Term Goal(s): Safe transition to appropriate next level of care at discharge, Engage patient in therapeutic group addressing interpersonal concerns.  Short Term Goals: Engage patient in aftercare planning with referrals and resources, Increase ability to appropriately verbalize feelings, Facilitate acceptance of mental health diagnosis and concerns, and Identify triggers associated with mental health/substance abuse issues  Therapeutic Interventions: Assess for all discharge needs, 1 to 1 time with  Social worker, Explore available resources and support systems, Assess for adequacy in community support network, Educate family and significant other(s) on suicide prevention, Complete Psychosocial Assessment, Interpersonal group therapy.  Evaluation of Outcomes: Progressing   Progress in Treatment: Attending groups: attended some groups Participating in groups: Yes Taking medication as prescribed: Yes. Toleration medication: Yes. Family/Significant other contact made: No, will contact:  patient declined consents due to lack of supports Patient understands diagnosis: Yes. Discussing patient identified problems/goals with staff: Yes. Medical problems stabilized or resolved: Yes. Denies suicidal/homicidal ideation: Yes. Issues/concerns per patient self-inventory: No.   New problem(s) identified: No, Describe:  none   New Short Term/Long Term Goal(s): medication stabilization, elimination of SI thoughts, development of comprehensive mental wellness plan.    Patient  Goals: I don't have no goal   Discharge Plan or Barriers: Patient recently admitted. CSW will continue to follow and assess for appropriate referrals and possible discharge planning.     Reason for Continuation of Hospitalization: Anxiety Depression Medication stabilization Other; describe mood stabilization, discharge planning   Estimated Length of Stay: 3 - 4 days  Last 3 [place] Suicide Severity Risk Score: Flowsheet Row Ad[facility] (Current) [facility] from 06/15/2024 in [facility] Department at [facility] ED from 05/02/2021 in [facility] at [facility]  C-SSRS RISK CATEGORY High Risk High Risk Error: Question 2 not populated    Last [facility] 2/9 Scores:    05/10/2019   10:24 AM 02/15/2019    3:53 PM 01/22/2019    9:21 AM  Depression screen PHQ 2/9  Decreased Interest 0 1 2  Down, Depressed, Hopeless 2 1 2   PHQ - 2 Score 2 2 4   Altered sleeping 1 1 2   Tired, decreased energy 1 1 2   Change in appetite 0 1 1  Feeling bad or failure about yourself  0 2 2  Trouble concentrating 1 1 3   Moving slowly or fidgety/restless 0 1 3  Suicidal thoughts 0 1 2  PHQ-9 Score 5 10 19   Difficult doing work/chores  Somewhat difficult     Scribe for Treatment Team: Viktoria Gruetzmacher O Zaylon Bossier, LCSWA 06/21/2024 7:24 PM

## 2024-06-21 NOTE — Plan of Care (Signed)

## 2024-06-21 NOTE — Progress Notes (Signed)
   06/21/24 1408  Spiritual Encounters  Type of Visit Attempt (pt unavailable)   Pt sleeping at time of visit. Will attempt to see 7/18 by 5pm.  Jaylin Benzel L. Fredrica, M.Div [phone]

## 2024-06-21 NOTE — Progress Notes (Signed)
   06/21/24 1735  Spiritual Encounters  Type of Visit Initial  Care provided to: Patient  Referral source Nurse (RN/NT/LPN)  Reason for visit Urgent spiritual support  OnCall Visit No  Spiritual Framework  Presenting Themes Impactful experiences and emotions   I responded to a spiritual consult request and also was able to receive brief report from Ashley Wesseh, RN regarding needs of Ms. Ashley Watts.  Initially, upon meeting Ashley Watts, she only asked that I pray for her son, Ashley Watts. Eventually, we reconnected and she asked to speak privately. She then debriefed with me at length regarding recent events of her son, being involved in multiple LE interactions and subsequent arrests, [place] shared her Sherlean faith, expressed her sadness and struggle to understand. She was appropriately emotive. Her concerns continue to center her son in spite of apparent betrayal.  I offered compassionate, non-anxious presence and both active and reflective listening. I engaged her faith perspective to join her in confronting painful experiences and uncertainty. I encouraged her to allow herself to rest, and to return some focus to her own health and well-being. I celebrated her life and offered words of support, specifically inviting focus on the here and now. I offered prayer. I then assisted getting her to evening meal once she was ready.  Will follow up on Monday. Ashley Watts L. Fredrica, M.Div [phone]

## 2024-06-21 NOTE — Progress Notes (Signed)
(  Any PRNs that were needed, meds refused, or side effects to meds)- atarax  prn, trazodone  prn, took all medications, no side effects to meds  (Any disturbances and when (visitation, over night)- N/A (Concerns raised by the patient)-  N/A (SI/HI/AVH)- denies SI/HI/AVH

## 2024-06-22 LAB — GLUCOSE, CAPILLARY
Glucose-Capillary: 146 mg/dL — ABNORMAL HIGH (ref 70–99)
Glucose-Capillary: 203 mg/dL — ABNORMAL HIGH (ref 70–99)
Glucose-Capillary: 179 mg/dL — ABNORMAL HIGH (ref 70–99)
Glucose-Capillary: 293 mg/dL — ABNORMAL HIGH (ref 70–99)

## 2024-06-22 NOTE — Progress Notes (Signed)
 Nurse Novella Iha was assessed through out the shift. Pt states that she is no longer having any thoughts of hurting herself. Was interested in discussing her life since her brain problems and how life is overwhelming.  Pt. Is very deliberate in her speech and takes time to gather her thoughts and verbalize them.  Denied need for any prn's and stated at end of shift, it had been a good day.

## 2024-06-22 NOTE — Group Note (Signed)
 Date:  06/22/2024 Time:  12:58 PM  Group Topic/Focus:  Goals Group:   The focus of this group is to help patients establish daily goals to achieve during treatment and discuss how the patient can incorporate goal setting into their daily lives to aide in recovery. Orientation:   The focus of this group is to educate the patient on the purpose and policies of crisis stabilization and provide a format to answer questions about their admission.  The group details unit policies and expectations of patients while admitted.    Participation Level:  Did Not Attend   Ashley Watts 06/22/2024, 12:58 PM

## 2024-06-22 NOTE — [facility] Group Notes (Signed)
 [facility] Group Notes:  (Nursing/MHT/Case Management/Adjunct)  Date:  06/22/2024  Time:  9:24 PM  Type of Therapy:  Wrap-up group  Participation Level:  Did Not Attend  Participation Quality:    Affect:    Cognitive:    Insight:    Engagement in Group:    Modes of Intervention:    Summary of Progress/Problems: Refused to attend group.  Grayce LITTIE Essex 06/22/2024, 9:24 PM

## 2024-06-22 NOTE — Group Note (Signed)
 Date/Time:  @TD @ 1:00-2:00  Type of Therapy and Topic:  Group Therapy:  Safety  Participation Level:  Did Not Attend   Description of Group This process group involved a discussion with and between patients about Safety.   This group revolves around central idea: You need to stay safe, the patient will learn to cope safely, no matter what negative life events come their way. The patient will identify and describe their safe and unsafe environment.   This then was followed by a discussion about how the patient are committed to their self, what it is, how important it is, and why we choose the coping techniques.  Participants were encouraged to think about commitment as necessary and positive.  Therapeutic Goals Patient will identify and describe one their safety Patient will participate in generating ideas about safety and how it can impact their environment The patient are encourage to explore their safety and who they would want to be their safety person.   Summary of Patient Progress:  The patient did not attend   Therapeutic Modalities Brief Solution-Focused Therapy Psychoeducation  Kenli Waldo O Findley Vi, LCSWA 06/22/2024  4:43 PM

## 2024-06-22 NOTE — Progress Notes (Signed)
   06/22/24 2157  Psych Admission Type (Psych Patients Only)  Admission Status Involuntary  Psychosocial Assessment  Patient Complaints Depression  Eye Contact Fair  Facial Expression Flat  Affect Appropriate to circumstance  Speech Logical/coherent  Interaction Assertive  Motor Activity Slow  Appearance/Hygiene Unremarkable  Behavior Characteristics Appropriate to situation  Mood Depressed;Pleasant  Thought Process  Coherency WDL  Content WDL  Delusions None reported or observed  Perception WDL  Hallucination None reported or observed  Judgment Limited  Confusion None  Danger to Self  Current suicidal ideation? Denies  Self-Injurious Behavior No self-injurious ideation or behavior indicators observed or expressed   Agreement Not to Harm Self Yes  Description of Agreement verbal  Danger to Others  Danger to Others None reported or observed

## 2024-06-22 NOTE — Progress Notes (Signed)
(  Sleep Hours) - 6.75 (Any PRNs that were needed, meds refused, or side effects to meds)- trazadone (Any disturbances and when (visitation, over night)- none (Concerns raised by the patient)- stressed regarding relationship with son and her recent arrest (SI/HI/AVH)- denied
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
## 2024-06-23 DIAGNOSIS — F39 Unspecified mood [affective] disorder: Secondary | ICD-10-CM

## 2024-06-23 LAB — GLUCOSE, CAPILLARY
Glucose-Capillary: 167 mg/dL — ABNORMAL HIGH (ref 70–99)
Glucose-Capillary: 214 mg/dL — ABNORMAL HIGH (ref 70–99)
Glucose-Capillary: 256 mg/dL — ABNORMAL HIGH (ref 70–99)
Glucose-Capillary: 272 mg/dL — ABNORMAL HIGH (ref 70–99)

## 2024-06-23 NOTE — Plan of Care (Signed)
   Problem: Education: Goal: Emotional status will improve Outcome: Progressing   Problem: Education: Goal: Mental status will improve Outcome: Progressing

## 2024-06-23 NOTE — Plan of Care (Signed)
 ?  Problem: Activity: ?Goal: Interest or engagement in activities will improve ?Outcome: Progressing ?Goal: Sleeping patterns will improve ?Outcome: Progressing ?  ?Problem: Coping: ?Goal: Ability to verbalize frustrations and anger appropriately will improve ?Outcome: Progressing ?Goal: Ability to demonstrate self-control will improve ?Outcome: Progressing ?  ?Problem: Safety: ?Goal: Periods of time without injury will increase ?Outcome: Progressing ?  ?

## 2024-06-23 NOTE — Progress Notes (Signed)
   06/23/24 2035  Psych Admission Type (Psych Patients Only)  Admission Status Involuntary  Psychosocial Assessment  Patient Complaints Anxiety;Depression  Eye Contact Fair  Facial Expression Flat  Affect Appropriate to circumstance  Speech Logical/coherent;Soft  Interaction Minimal  Motor Activity Slow  Appearance/Hygiene Unremarkable  Behavior Characteristics Appropriate to situation  Mood Depressed;Anxious;Pleasant  Thought Process  Coherency WDL  Content WDL  Delusions None reported or observed  Perception WDL  Hallucination None reported or observed  Judgment Limited  Confusion None  Danger to Self  Current suicidal ideation? Denies  Self-Injurious Behavior No self-injurious ideation or behavior indicators observed or expressed   Agreement Not to Harm Self Yes  Description of Agreement verbal

## 2024-06-23 NOTE — Progress Notes (Addendum)
(  Sleep Hours) - (Any PRNs that were needed, meds refused, or side effects to meds)-  (Any disturbances and when (visitation, over night)- (Concerns raised by the patient)-  (SI/HI/AVH)-  Slept 6.25 hours Trazodone  given prn No concerns voiced, no disturbances overnight Denies SI/HI/AVH

## 2024-06-23 NOTE — Group Note (Signed)
 Date:  06/23/2024 Time:  9:21 AM  Group Topic/Focus:  Goals Group:   The focus of this group is to help patients establish daily goals to achieve during treatment and discuss how the patient can incorporate goal setting into their daily lives to aide in recovery. Orientation:   The focus of this group is to educate the patient on the purpose and policies of crisis stabilization and provide a format to answer questions about their admission.  The group details unit policies and expectations of patients while admitted.    Participation Level:  Active  Participation Quality:  Appropriate  Affect:  Appropriate  Cognitive:  Appropriate  Insight: Appropriate  Engagement in Group:  Engaged  Modes of Intervention:  Orientation  Additional Comments:    Rankin JONETTA Sierra 06/23/2024, 9:21 AM

## 2024-06-23 NOTE — Progress Notes (Signed)
   06/23/24 0928  Psych Admission Type (Psych Patients Only)  Admission Status Involuntary  Psychosocial Assessment  Patient Complaints Anxiety;Depression  Eye Contact Fair  Facial Expression Worried  Affect Appropriate to circumstance  Speech Logical/coherent  Interaction Assertive  Motor Activity Slow  Appearance/Hygiene Unremarkable  Behavior Characteristics Appropriate to situation  Mood Depressed;Anxious  Thought Process  Coherency WDL  Content WDL  Delusions None reported or observed  Perception WDL  Hallucination None reported or observed  Judgment Limited  Confusion None  Danger to Self  Current suicidal ideation? Denies  Agreement Not to Harm Self Yes  Description of Agreement Verbal  Danger to Others  Danger to Others None reported or observed

## 2024-06-23 NOTE — Group Note (Signed)
 Date:  06/23/2024 Time:  9:13 PM  Group Topic/Focus:  Wrap-Up Group:   The focus of this group is to help patients review their daily goal of treatment and discuss progress on daily workbooks.    Participation Level:  Active  Participation Quality:  Appropriate  Affect:  Appropriate  Cognitive:  Appropriate  Insight: Appropriate  Engagement in Group:  Engaged  Modes of Intervention:  Discussion  Additional Comments:  Patient stated  that her day was a 7 out of 10.  She stated her goal is to work on her depression   Bari Moats 06/23/2024, 9:13 PM
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
## 2024-06-24 LAB — GLUCOSE, CAPILLARY
Glucose-Capillary: 138 mg/dL — ABNORMAL HIGH (ref 70–99)
Glucose-Capillary: 152 mg/dL — ABNORMAL HIGH (ref 70–99)

## 2024-06-24 MED ORDER — HYDROXYZINE HCL 25 MG PO TABS: 25.0000 mg | ORAL_TABLET | Freq: Three times a day (TID) | ORAL | 0 refills | Status: AC | PRN

## 2024-06-24 MED ORDER — SERTRALINE HCL 100 MG PO TABS: 100.0000 mg | ORAL_TABLET | Freq: Every day | ORAL | 0 refills | Status: AC

## 2024-06-24 MED ORDER — TRAZODONE HCL 50 MG PO TABS: 50.0000 mg | ORAL_TABLET | Freq: Every evening | ORAL | 0 refills | Status: AC | PRN

## 2024-06-24 NOTE — Discharge Instructions (Addendum)
 Please call to schedule a consultation:  [facility] [phone] [address]  [address] [place], [place] [address]

## 2024-06-24 NOTE — Progress Notes (Addendum)
 Patient discharged from [facility] on 06/24/24. Patient denies SI, plan, and intention. Suicide safety plan completed, reviewed with this RN, given to the patient, and a copy in the chart. Patient denies HI/AVH upon discharge. Patient is alert, oriented, and cooperative. RN provided patient with discharge paperwork and reviewed information with patient. Patient expressed that she understood all of the discharge instructions. Pt was satisfied with belongings returned to her from the locker and at bedside. Discharged patient to [facility] waiting room. Safe transport awaiting patient in the [facility] waiting room.

## 2024-06-24 NOTE — Plan of Care (Signed)
   Problem: Education: Goal: Emotional status will improve Outcome: Progressing Goal: Mental status will improve Outcome: Progressing   Problem: Activity: Goal: Interest or engagement in activities will improve Outcome: Progressing Goal: Sleeping patterns will improve Outcome: Progressing

## 2024-06-24 NOTE — Progress Notes (Signed)
  [facility] Adult Case Management Discharge Plan :  Will you be returning to the same living situation after discharge:  Yes,  pt will be returning home after discharge.  At discharge, do you have transportation home?: Yes,  pt will be transported to her vehicle at [facility] parking lot then driving herself home.  Do you have the ability to pay for your medications: Yes,  pt is insured - [employer] Dual complete.   Release of information consent forms completed and in the chart;  Patient's signature needed at discharge.  Patient to Follow up at:  Follow-up Information     [facility] Go on 06/25/2024.   Why: You have a hospital follow up appointment, to obtain therapy and medication management services on 06/25/24 at 11:00 am .  The appointment will be held in person. Contact information: [address] [place] [address] [phone]                 Next level of care provider has access to [facility] Link:no  Safety Planning and Suicide Prevention discussed: Yes,  completed with patient as she did not provide consent to contact family/friends.     Has patient been referred to the Quitline?: Patient does not use tobacco/nicotine products  Patient has been referred for addiction treatment: No known substance use disorder.  Nic Lampe M Lynzy Rawles, LCSWA 06/24/2024, 9:54 AM

## 2024-06-24 NOTE — Group Note (Unsigned)
 Date:  06/24/2024 Time:  9:03 AM  Group Topic/Focus:  Goals Group:   The focus of this group is to help patients establish daily goals to achieve during treatment and discuss how the patient can incorporate goal setting into their daily lives to aide in recovery.     Participation Level:  {BHH PARTICIPATION OZCZO:77735}  Participation Quality:  {BHH PARTICIPATION QUALITY:22265}  Affect:  {BHH AFFECT:22266}  Cognitive:  {BHH COGNITIVE:22267}  Insight: {BHH Insight2:20797}  Engagement in Group:  {BHH ENGAGEMENT IN HMNLE:77731}  Modes of Intervention:  {BHH MODES OF INTERVENTION:22269}  Additional Comments:  ***  Ashley Watts 06/24/2024, 9:03 AM

## 2024-06-24 NOTE — Transportation (Signed)
 06/24/2024  Suzen JONETTA Perna DOB: [birth year] MRN: [id number]   RIDER WAIVER AND RELEASE OF LIABILITY  For the purposes of helping with transportation needs, [facility] partners with outside transportation providers (taxi companies, [place], Catering manager.) to give [facility] patients or other approved people the choice of on-demand rides Public librarian) to our buildings for non-emergency visits.  By using [employer], I, the person signing this document, on behalf of myself and/or any legal minors (in my care using the [employer]), agree:  Science writer given to me are supplied by independent, outside transportation providers who do not work for, or have any affiliation with, [employer]. [employer] is not a transportation company. [facility] has no control over the quality or safety of the rides I get using [employer]. [facility] has no control over whether any outside ride will happen on time or not. [facility] gives no guarantee on the reliability, quality, safety, or availability on any rides, or that no mistakes will happen. I know and accept that traveling by vehicle (car, truck, SVU, fleeta, bus, taxi, etc.) has risks of serious injuries such as disability, being paralyzed, and death. I know and agree the risk of using [employer] is mine alone, and not [employer]. [employer] are provided as is and as are available. The transportation providers are in charge for all inspections and care of the vehicles used to provide these rides. I agree not to take legal action against [facility], its agents, employees, officers, directors, representatives, insurers, attorneys, assigns, successors, subsidiaries, and affiliates at any time for any reasons related directly or indirectly to using [employer]. I also agree not to take legal action against [facility] or its affiliates for any injury, death, or damage to property caused by or related to using  [employer]. I have read this Waiver and Release of Liability, and I understand the terms used in it and their legal meaning. This Waiver is freely and voluntarily given with the understanding that my right (or any legal minors) to legal action against  relating to [employer] is knowingly given up to use these services.   I attest that I read the Ride Waiver and Release of Liability to Suzen JONETTA Perna, gave Ms. Ramaswamy the opportunity to ask questions and answered the questions asked (if any). I affirm that Justyna D Sustaita then provided consent for assistance with transportation.     Ryler Laskowski, LCSWA 06/24/24 9:57am

## 2024-06-24 NOTE — Progress Notes (Signed)
(  Sleep Hours) - 7.75 hours (Any PRNs that were needed, meds refused, or side effects to meds)- Trazodone  50 mg given (Any disturbances and when (visitation, over night)- None (Concerns raised by the patient)- None (SI/HI/AVH)-  Denies

## 2024-06-24 NOTE — Plan of Care (Signed)
  Problem: Activity: Goal: Interest or engagement in activities will improve Outcome: Progressing   Problem: Activity: Goal: Sleeping patterns will improve Outcome: Progressing   
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
## 2024-06-24 NOTE — Progress Notes (Signed)
   06/24/24 0820  Psych Admission Type (Psych Patients Only)  Admission Status Involuntary  Psychosocial Assessment  Patient Complaints Anxiety;Depression  Eye Contact Fair  Facial Expression Flat  Affect Flat  Speech Slow;Soft  Interaction Minimal  Motor Activity Slow  Appearance/Hygiene Unremarkable  Behavior Characteristics Cooperative  Mood Depressed;Anxious  Thought Process  Coherency WDL  Content WDL  Delusions None reported or observed  Perception WDL  Hallucination None reported or observed  Judgment Limited  Confusion None  Danger to Self  Current suicidal ideation? Denies  Self-Injurious Behavior No self-injurious ideation or behavior indicators observed or expressed   Agreement Not to Harm Self Yes  Description of Agreement Pt verbally contracts for safety  Danger to Others  Danger to Others None reported or observed
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
